# Patient Record
Sex: Female | Born: 1960 | Race: White | Hispanic: No | Smoking: Former smoker
Health system: Southern US, Community
[De-identification: ages and names within clinical notes are randomized; demographics above are authoritative.]

## PROBLEM LIST (undated history)

## (undated) DIAGNOSIS — E785 Hyperlipidemia, unspecified: Secondary | ICD-10-CM

## (undated) DIAGNOSIS — I10 Essential (primary) hypertension: Secondary | ICD-10-CM

## (undated) DIAGNOSIS — F329 Major depressive disorder, single episode, unspecified: Secondary | ICD-10-CM

## (undated) DIAGNOSIS — R112 Nausea with vomiting, unspecified: Secondary | ICD-10-CM

## (undated) DIAGNOSIS — F32A Depression, unspecified: Secondary | ICD-10-CM

## (undated) DIAGNOSIS — M1990 Unspecified osteoarthritis, unspecified site: Secondary | ICD-10-CM

## (undated) DIAGNOSIS — G459 Transient cerebral ischemic attack, unspecified: Secondary | ICD-10-CM

## (undated) DIAGNOSIS — K219 Gastro-esophageal reflux disease without esophagitis: Secondary | ICD-10-CM

## (undated) DIAGNOSIS — I639 Cerebral infarction, unspecified: Secondary | ICD-10-CM

## (undated) DIAGNOSIS — G4730 Sleep apnea, unspecified: Secondary | ICD-10-CM

## (undated) DIAGNOSIS — Z9889 Other specified postprocedural states: Secondary | ICD-10-CM

## (undated) DIAGNOSIS — R011 Cardiac murmur, unspecified: Secondary | ICD-10-CM

## (undated) DIAGNOSIS — J069 Acute upper respiratory infection, unspecified: Secondary | ICD-10-CM

## (undated) DIAGNOSIS — E7800 Pure hypercholesterolemia, unspecified: Secondary | ICD-10-CM

## (undated) DIAGNOSIS — F419 Anxiety disorder, unspecified: Secondary | ICD-10-CM

## (undated) HISTORY — PX: KNEE ARTHROSCOPY: SUR90

## (undated) HISTORY — DX: Sleep apnea, unspecified: G47.30

## (undated) HISTORY — DX: Acute upper respiratory infection, unspecified: J06.9

## (undated) HISTORY — DX: Hyperlipidemia, unspecified: E78.5

## (undated) HISTORY — PX: APPENDECTOMY: SHX54

## (undated) HISTORY — PX: NASAL SINUS SURGERY: SHX719

## (undated) HISTORY — DX: Essential (primary) hypertension: I10

## (undated) HISTORY — PX: JOINT REPLACEMENT: SHX530

## (undated) HISTORY — DX: Pure hypercholesterolemia, unspecified: E78.00

---
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
## 2008-05-05 ENCOUNTER — Emergency Department (HOSPITAL_COMMUNITY): Admission: EM | Admit: 2008-05-05 | Discharge: 2008-05-05 | Payer: Self-pay | Admitting: Emergency Medicine

## 2011-03-19 LAB — D-DIMER, QUANTITATIVE: D-Dimer, Quant: 0.52 — ABNORMAL HIGH

## 2011-03-19 LAB — POCT CARDIAC MARKERS
CKMB, poc: 2.1
Myoglobin, poc: 64.1
Troponin i, poc: <0.05

## 2011-03-19 LAB — POCT I-STAT, CHEM 8
BUN: 9
Calcium, Ion: 1.17
Chloride: 103
Creatinine, Ser: 0.9
Glucose, Bld: 103 — ABNORMAL HIGH
HCT: 41
Hemoglobin: 13.9
Potassium: 3.7
Sodium: 140
TCO2: 26

## 2011-08-12 ENCOUNTER — Emergency Department (HOSPITAL_COMMUNITY): Payer: Medicare Other

## 2011-08-12 ENCOUNTER — Emergency Department (HOSPITAL_COMMUNITY)
Admission: EM | Admit: 2011-08-12 | Discharge: 2011-08-12 | Disposition: A | Discharge status: Home / Self Care | Payer: Medicare Other | Attending: Emergency Medicine | Admitting: Emergency Medicine

## 2011-08-12 ENCOUNTER — Other Ambulatory Visit: Payer: Self-pay

## 2011-08-12 DIAGNOSIS — R079 Chest pain, unspecified: Secondary | ICD-10-CM | POA: Insufficient documentation

## 2011-08-12 DIAGNOSIS — Z79899 Other long term (current) drug therapy: Secondary | ICD-10-CM | POA: Insufficient documentation

## 2011-08-12 DIAGNOSIS — R110 Nausea: Secondary | ICD-10-CM | POA: Insufficient documentation

## 2011-08-12 LAB — POCT I-STAT TROPONIN I
Troponin i, poc: 0.02 ng/mL (ref 0.00–0.08)
Troponin i, poc: 0.01 ng/mL (ref 0.00–0.08)

## 2011-08-12 LAB — COMPREHENSIVE METABOLIC PANEL
AST: 28 U/L (ref 0–37)
Albumin: 3.8 g/dL (ref 3.5–5.2)
Alkaline Phosphatase: 103 U/L (ref 39–117)
BUN: 14 mg/dL (ref 6–23)
CO2: 23 mEq/L (ref 19–32)
Calcium: 10.4 mg/dL (ref 8.4–10.5)
Chloride: 100 mEq/L (ref 96–112)
Glucose, Bld: 246 mg/dL — ABNORMAL HIGH (ref 70–99)
Potassium: 4 mEq/L (ref 3.5–5.1)
Sodium: 137 mEq/L (ref 135–145)
Total Protein: 7.6 g/dL (ref 6.0–8.3)

## 2011-08-12 LAB — DIFFERENTIAL
Basophils Relative: 0 % (ref 0–1)
Eosinophils Absolute: 0 10*3/uL (ref 0.0–0.7)
Eosinophils Relative: 0 % (ref 0–5)
Lymphs Abs: 1.3 10*3/uL (ref 0.7–4.0)
Monocytes Absolute: 0.7 10*3/uL (ref 0.1–1.0)
Monocytes Relative: 5 % (ref 3–12)
Neutro Abs: 12.5 10*3/uL — ABNORMAL HIGH (ref 1.7–7.7)
Neutrophils Relative %: 86 % — ABNORMAL HIGH (ref 43–77)

## 2011-08-12 LAB — CBC
HCT: 43.9 % (ref 36.0–46.0)
Hemoglobin: 15.1 g/dL — ABNORMAL HIGH (ref 12.0–15.0)
MCHC: 34.4 g/dL (ref 30.0–36.0)
RDW: 11.6 % (ref 11.5–15.5)

## 2011-08-12 MED ORDER — MORPHINE SULFATE 2 MG/ML IJ SOLN
2.0000 mg | Freq: Once | INTRAMUSCULAR | Status: AC
  Administered 2011-08-12: 2 mg via INTRAVENOUS

## 2011-08-12 MED ORDER — GI COCKTAIL ~~LOC~~
30.0000 mL | Freq: Once | ORAL | Status: AC
  Administered 2011-08-12: 30 mL via ORAL
  Filled 2011-08-12: qty 30

## 2011-08-12 MED ORDER — MORPHINE SULFATE 4 MG/ML IJ SOLN
4.0000 mg | Freq: Once | INTRAMUSCULAR | Status: AC
  Administered 2011-08-12: 2 mg via INTRAVENOUS
  Filled 2011-08-12: qty 1

## 2011-08-12 MED ORDER — MORPHINE SULFATE 2 MG/ML IJ SOLN
INTRAMUSCULAR | Status: AC
  Filled 2011-08-12: qty 1

## 2011-08-12 MED ORDER — HYDROCODONE-ACETAMINOPHEN 5-325 MG PO TABS: 1.0000 | ORAL_TABLET | Freq: Four times a day (QID) | ORAL | Status: AC | PRN

## 2011-08-12 MED ORDER — ONDANSETRON HCL 4 MG/2ML IJ SOLN
INTRAMUSCULAR | Status: AC
  Administered 2011-08-12: 4 mg
  Filled 2011-08-12: qty 2

## 2011-08-12 NOTE — ED Notes (Signed)
Pt c/o tightness in center of chest onset approx 8 pm last night, states was just sitting watching tv.

## 2011-08-12 NOTE — Discharge Instructions (Signed)
Increase protonix to two times a day and follow up with your md later this week.

## 2011-08-12 NOTE — ED Provider Notes (Signed)
History     CSN: [id number]  Arrival date & time 08/12/11  8295   First MD Initiated Contact with Patient 08/12/11 [phone]      Chief Complaint  Patient presents with  . Chest Pain    (Consider location/radiation/quality/duration/timing/severity/associated sxs/prior treatment) Patient is a 51 y.o. female presenting with chest pain. The history is provided by the patient (patient complains of chest tightness today. She does not have any shortness of breath no sweating the pain is not pleuritic). No language interpreter was used.  Chest Pain The chest pain began less than 1 hour ago. Chest pain occurs constantly. The chest pain is unchanged. Associated with: nothing. At its most intense, the pain is at 6/10. The pain is currently at 4/10. The quality of the pain is described as dull. The pain does not radiate. Exacerbated by: nothing. Pertinent negatives for primary symptoms include no fever, no fatigue, no cough and no abdominal pain.  Pertinent negatives for associated symptoms include no claudication. She tried nothing for the symptoms. Risk factors include no known risk factors.  Pertinent negatives for past medical history include no aneurysm, no PE and no seizures.  Her family medical history is significant for hypertension in family.     No past medical history on file.  No past surgical history on file.  No family history on file.  History  Substance Use Topics  . Smoking status: Not on file  . Smokeless tobacco: Not on file  . Alcohol Use: Not on file    OB History    No data available      Review of Systems  Constitutional: Negative for fever and fatigue.  HENT: Negative for congestion, sinus pressure and ear discharge.   Eyes: Negative for discharge.  Respiratory: Negative for cough.   Cardiovascular: Positive for chest pain. Negative for claudication.  Gastrointestinal: Negative for abdominal pain and diarrhea.  Genitourinary: Negative for frequency and  hematuria.  Musculoskeletal: Negative for back pain.  Skin: Negative for rash.  Neurological: Negative for seizures and headaches.  Hematological: Negative.   Psychiatric/Behavioral: Negative for hallucinations.    Allergies  Sulfa antibiotics and Bactrim  Home Medications   Current Outpatient Rx  Name Route Sig Dispense Refill  . AMOXICILLIN-POT CLAVULANATE 875-125 MG PO TABS Oral Take 1 tablet by mouth 2 (two) times daily.    Marland Kitchen ESCITALOPRAM OXALATE 10 MG PO TABS Oral Take 10 mg by mouth daily.    . OMEGA-3 FATTY ACIDS 1000 MG PO CAPS Oral Take 2 g by mouth daily.    Marland Kitchen FLUTICASONE PROPIONATE 50 MCG/ACT NA SUSP Nasal Place 2 sprays into the nose daily.    Marland Kitchen FOLIC ACID 1 MG PO TABS Oral Take 1 mg by mouth daily.    . FUROSEMIDE 20 MG PO TABS Oral Take 20 mg by mouth 2 (two) times daily.    . IRBESARTAN-HYDROCHLOROTHIAZIDE 300-12.5 MG PO TABS Oral Take 1 tablet by mouth daily.    Marland Kitchen MINOCYCLINE HCL 50 MG PO TABS Oral Take 50 mg by mouth 2 (two) times daily.    . MULTIVITAMINS PO CAPS Oral Take 1 capsule by mouth daily.    Marland Kitchen PANTOPRAZOLE SODIUM 40 MG PO TBEC Oral Take 40 mg by mouth daily.    Marland Kitchen HYDROCODONE-ACETAMINOPHEN 5-325 MG PO TABS Oral Take 1 tablet by mouth every 6 (six) hours as needed for pain. 10 tablet 0    BP 147/66  Temp(Src) 98.3 F (36.8 C) (Oral)  Resp 18  Ht  5\' 4"  (1.626 m)  Wt 223 lb (101.152 kg)  BMI 38.28 kg/m2  SpO2 99%  Physical Exam  Constitutional: She is oriented to person, place, and time. She appears well-developed.  HENT:  Head: Normocephalic and atraumatic.  Eyes: Conjunctivae and EOM are normal. No scleral icterus.  Neck: Neck supple. No thyromegaly present.  Cardiovascular: Normal rate and regular rhythm.  Exam reveals no gallop and no friction rub.   No murmur heard. Pulmonary/Chest: No stridor. She has no wheezes. She has no rales. She exhibits no tenderness.  Abdominal: She exhibits no distension. There is no tenderness. There is no rebound.   Musculoskeletal: Normal range of motion. She exhibits no edema.  Lymphadenopathy:    She has no cervical adenopathy.  Neurological: She is oriented to person, place, and time. Coordination normal.  Skin: No rash noted. No erythema.  Psychiatric: She has a normal mood and affect. Her behavior is normal.    ED Course  Procedures (including critical care time)  Labs Reviewed  CBC - Abnormal; Notable for the following:    WBC 14.5 (*)    Hemoglobin 15.1 (*)    All other components within normal limits  DIFFERENTIAL - Abnormal; Notable for the following:    Neutrophils Relative 86 (*)    Neutro Abs 12.5 (*)    Lymphocytes Relative 9 (*)    All other components within normal limits  COMPREHENSIVE METABOLIC PANEL - Abnormal; Notable for the following:    Glucose, Bld 246 (*)    ALT 46 (*)    Total Bilirubin 0.2 (*)    All other components within normal limits  LIPASE, BLOOD  TROPONIN I  POCT I-STAT TROPONIN I  POCT I-STAT TROPONIN I   Dg Chest Portable 1 View  08/12/2011  *RADIOLOGY REPORT*  Clinical Data: Chest pain and nausea; history of smoking.  PORTABLE CHEST - 1 VIEW  Comparison: Chest radiograph and CTA of the chest performed 05/05/2008  Findings: The lungs are well-aerated.  Minimal left midlung opacity may reflect atelectasis or possibly mild pneumonia.  There is no evidence of pleural effusion or pneumothorax.  The cardiomediastinal silhouette is borderline enlarged.  No acute osseous abnormalities are seen.  IMPRESSION:  1.  Minimal left midlung opacity may reflect atelectasis or possibly mild pneumonia. 2.  Borderline cardiomegaly.  Original Report Authenticated By: Tonia Ghent, M.D.     1. Chest pain     Date: 08/12/2011  Rate:86  Rhythm: normal sinus rhythm  QRS Axis: normal  Intervals: normal  ST/T Wave abnormalities: normal  Conduction Disutrbances:first-degree A-V block   Narrative Interpretation:   Old EKG Reviewed: none available     MDM  Chest  pain,  Non cardiac,          Benny Lennert, MD 08/12/11 [phone]

## 2012-02-16 ENCOUNTER — Emergency Department (HOSPITAL_COMMUNITY)
Admission: EM | Admit: 2012-02-16 | Discharge: 2012-02-16 | Disposition: A | Discharge status: Home / Self Care | Payer: Medicare Other | Attending: Emergency Medicine | Admitting: Emergency Medicine

## 2012-02-16 ENCOUNTER — Encounter (HOSPITAL_COMMUNITY): Payer: Self-pay | Admitting: Emergency Medicine

## 2012-02-16 DIAGNOSIS — S0501XA Injury of conjunctiva and corneal abrasion without foreign body, right eye, initial encounter: Secondary | ICD-10-CM

## 2012-02-16 DIAGNOSIS — S058X9A Other injuries of unspecified eye and orbit, initial encounter: Secondary | ICD-10-CM | POA: Insufficient documentation

## 2012-02-16 DIAGNOSIS — K219 Gastro-esophageal reflux disease without esophagitis: Secondary | ICD-10-CM | POA: Insufficient documentation

## 2012-02-16 DIAGNOSIS — Z882 Allergy status to sulfonamides status: Secondary | ICD-10-CM | POA: Insufficient documentation

## 2012-02-16 DIAGNOSIS — X58XXXA Exposure to other specified factors, initial encounter: Secondary | ICD-10-CM | POA: Insufficient documentation

## 2012-02-16 DIAGNOSIS — Y93H9 Activity, other involving exterior property and land maintenance, building and construction: Secondary | ICD-10-CM | POA: Insufficient documentation

## 2012-02-16 DIAGNOSIS — F411 Generalized anxiety disorder: Secondary | ICD-10-CM | POA: Insufficient documentation

## 2012-02-16 DIAGNOSIS — Z23 Encounter for immunization: Secondary | ICD-10-CM | POA: Insufficient documentation

## 2012-02-16 DIAGNOSIS — I10 Essential (primary) hypertension: Secondary | ICD-10-CM | POA: Insufficient documentation

## 2012-02-16 DIAGNOSIS — Y92009 Unspecified place in unspecified non-institutional (private) residence as the place of occurrence of the external cause: Secondary | ICD-10-CM | POA: Insufficient documentation

## 2012-02-16 DIAGNOSIS — F17200 Nicotine dependence, unspecified, uncomplicated: Secondary | ICD-10-CM | POA: Insufficient documentation

## 2012-02-16 HISTORY — DX: Anxiety disorder, unspecified: F41.9

## 2012-02-16 HISTORY — DX: Essential (primary) hypertension: I10

## 2012-02-16 HISTORY — DX: Gastro-esophageal reflux disease without esophagitis: K21.9

## 2012-02-16 MED ORDER — HYDROCODONE-ACETAMINOPHEN 5-325 MG PO TABS: ORAL_TABLET | ORAL | Status: DC

## 2012-02-16 MED ORDER — TETANUS-DIPHTH-ACELL PERTUSSIS 5-2.5-18.5 LF-MCG/0.5 IM SUSP
0.5000 mL | Freq: Once | INTRAMUSCULAR | Status: AC
  Administered 2012-02-16: 0.5 mL via INTRAMUSCULAR
  Filled 2012-02-16: qty 0.5

## 2012-02-16 MED ORDER — TOBRAMYCIN 0.3 % OP SOLN
2.0000 [drp] | OPHTHALMIC | Status: DC
  Administered 2012-02-16: 2 [drp] via OPHTHALMIC
  Filled 2012-02-16: qty 5

## 2012-02-16 NOTE — ED Provider Notes (Signed)
Medical screening examination/treatment/procedure(s) were performed by non-physician practitioner and as supervising physician I was immediately available for consultation/collaboration.   Glynn Octave, MD 02/16/12 [phone]

## 2012-02-16 NOTE — ED Notes (Signed)
Patient with no complaints at this time. Respirations even and unlabored. Skin warm/dry. Discharge instructions reviewed with patient at this time. Patient given opportunity to voice concerns/ask questions. Patient discharged at this time and left Emergency Department with steady gait.   

## 2012-02-16 NOTE — ED Provider Notes (Signed)
History     CSN: [id number]  Arrival date & time 02/16/12  4098   First MD Initiated Contact with Patient 02/16/12 [phone]      Chief Complaint  Patient presents with  . Eye Pain    (Consider location/radiation/quality/duration/timing/severity/associated sxs/prior treatment) HPI Comments: Patient states she was power washing her home 4 days ago when some of the stucco flew into her eye. She states she was not wearing protective goggles or eyewear. It is of note that she wears contact lens and had contact lens on at the time of the accident. The patient states that she had pain in her eyes at night of the event and the day after. She tried eyewash and eye drops (over-the-counter). She states however these did not help. The pain got progressively worse, and I got more rid and so she came to the emergency department for evaluation of this particular problem.  Patient is a 51 y.o. female presenting with eye pain. The history is provided by the patient.  Eye Pain Pertinent negatives include no abdominal pain, arthralgias, chest pain, coughing or neck pain.    Past Medical History  Diagnosis Date  . Hypertension   . GERD (gastroesophageal reflux disease)   . Anxiety     Past Surgical History  Procedure Date  . Appendectomy   . Joint replacement     Family History  Problem Relation Age of Onset  . Diabetes Father   . Stroke Father     History  Substance Use Topics  . Smoking status: Current Everyday Smoker -- 1.2 packs/day for 30 years    Types: Cigarettes  . Smokeless tobacco: Never Used  . Alcohol Use: Yes     occasionally    OB History    Grav Para Term Preterm Abortions TAB SAB Ect Mult Living            2      Review of Systems  Constitutional: Negative for activity change.       All ROS Neg except as noted in HPI  HENT: Negative for nosebleeds and neck pain.   Eyes: Positive for photophobia, pain and redness. Negative for discharge.  Respiratory: Negative for  cough, shortness of breath and wheezing.   Cardiovascular: Negative for chest pain and palpitations.  Gastrointestinal: Negative for abdominal pain and blood in stool.  Genitourinary: Negative for dysuria, frequency and hematuria.  Musculoskeletal: Negative for back pain and arthralgias.  Skin: Negative.   Neurological: Negative for dizziness, seizures and speech difficulty.  Psychiatric/Behavioral: Negative for hallucinations and confusion. The patient is nervous/anxious.     Allergies  Sulfa antibiotics and Bactrim  Home Medications   Current Outpatient Rx  Name Route Sig Dispense Refill  . AMOXICILLIN-POT CLAVULANATE 875-125 MG PO TABS Oral Take 1 tablet by mouth 2 (two) times daily.    Marland Kitchen ESCITALOPRAM OXALATE 10 MG PO TABS Oral Take 10 mg by mouth daily.    . OMEGA-3 FATTY ACIDS 1000 MG PO CAPS Oral Take 2 g by mouth daily.    Marland Kitchen FLUTICASONE PROPIONATE 50 MCG/ACT NA SUSP Nasal Place 2 sprays into the nose daily.    Marland Kitchen FOLIC ACID 1 MG PO TABS Oral Take 1 mg by mouth daily.    . FUROSEMIDE 20 MG PO TABS Oral Take 20 mg by mouth 2 (two) times daily.    . IRBESARTAN-HYDROCHLOROTHIAZIDE 300-12.5 MG PO TABS Oral Take 1 tablet by mouth daily.    Marland Kitchen MINOCYCLINE HCL 50 MG PO TABS  Oral Take 50 mg by mouth 2 (two) times daily.    . MULTIVITAMINS PO CAPS Oral Take 1 capsule by mouth daily.    Marland Kitchen PANTOPRAZOLE SODIUM 40 MG PO TBEC Oral Take 40 mg by mouth daily.      There were no vitals taken for this visit.  Physical Exam  Nursing note and vitals reviewed. Constitutional: She is oriented to person, place, and time. She appears well-developed and well-nourished.  Non-toxic appearance.  HENT:  Head: Normocephalic.  Right Ear: Tympanic membrane and external ear normal.  Left Ear: Tympanic membrane and external ear normal.  Eyes: EOM and lids are normal. Pupils are equal, round, and reactive to light.       There is mild swelling of the upper lid of the right eye. Lids of the left eye are well  within normal limits. The extraocular movements are intact the anterior chamber is clear. The conjunctiva is injected of the right eye. The lids were everted, and no foreign body was noted under the lids.  Tetracaine was applied to the right eye. Eye was stained with fluorescein. Examination underwent slammed reveals a small corneal abrasion at the 12:00 position. No foreign body is noted.  Neck: Normal range of motion. Neck supple. Carotid bruit is not present.  Cardiovascular: Normal rate, regular rhythm, normal heart sounds, intact distal pulses and normal pulses.   Pulmonary/Chest: Breath sounds normal. No respiratory distress.       Bilateral rhonchi present.  Abdominal: Soft. Bowel sounds are normal. There is no tenderness. There is no guarding.  Musculoskeletal: Normal range of motion.  Lymphadenopathy:       Head (right side): No submandibular adenopathy present.       Head (left side): No submandibular adenopathy present.    She has no cervical adenopathy.  Neurological: She is alert and oriented to person, place, and time. She has normal strength. No cranial nerve deficit or sensory deficit.  Skin: Skin is warm and dry.  Psychiatric: She has a normal mood and affect. Her speech is normal.    ED Course  Procedures (including critical care time)  Labs Reviewed - No data to display No results found.   No diagnosis found.    MDM  I have reviewed nursing notes, vital signs, and all appropriate lab and imaging results for this patient. Patient was pressure washing her house when some stucco flew into the right eye. The patient was not wearing protective eye gear and had on contact lens at the time. Examination at this time reveals a small corneal abrasion at the 12:00 position and the extraocular movements are intact and the anterior chamber is clear. The findings have been discussed with the patient's patient in terms which she understands. The plan at this time is for the patient  to receive tobramycin eyedrops 2 to the right eye every 4 hours for the next 5 days. She is to avoid bright lights. Prescription for Norco one every 4 hours as needed for headache. The patient is to see the ophthalmologist if not improving. Patient is also been advised to not use the contact lens over the next 5 days until this area has healed.       Kathie Dike, [place] 02/16/12 1016

## 2012-02-16 NOTE — ED Notes (Signed)
Patient was pressure washing with water on Thursday and felt like she got something in her right eye. Patient reports eye being slightly irritated. Patient states "I took my contact out last night and that's when it really started to bother me."

## 2014-06-22 ENCOUNTER — Ambulatory Visit (INDEPENDENT_AMBULATORY_CARE_PROVIDER_SITE_OTHER): Payer: Medicare Other | Admitting: Internal Medicine

## 2014-06-22 ENCOUNTER — Encounter (INDEPENDENT_AMBULATORY_CARE_PROVIDER_SITE_OTHER): Payer: Self-pay

## 2014-06-22 ENCOUNTER — Encounter: Payer: Self-pay | Admitting: Internal Medicine

## 2014-06-22 VITALS — BP 132/82 | HR 69 | Ht 64.0 in | Wt 228.8 lb

## 2014-06-22 DIAGNOSIS — G4733 Obstructive sleep apnea (adult) (pediatric): Secondary | ICD-10-CM

## 2014-06-22 NOTE — Progress Notes (Addendum)
06/22/14- 11 yoF smoker Self referral. Owns CPAP- not used x 1 year. Sleep study in [place], [place] (Dr. Felton ). DME [facility]. Epworth Score: 18 NPSG 07/18/09- [place]- AHI 8/ hr She felt better wearing CPAP but quit when she kept taking it off in her sleep. Was using [facility]. Wakes up 2 or 3 times a night and sits, watching TV until she gets sleepy. Daytime sleepiness. Loud snoring. Bedtime between 8 and 9 PM latency 15 minutes, waking 2 or 3 times that up between 6 and 7 AM. Up about 15 pounds. Sinus surgery 2 years ago. 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 One pack per day smoker. Divorced, living with her son. Family history of heart disease and cancer.  Prior to Admission medications   Medication Sig Start Date End Date Taking? Authorizing Provider  Artificial Tear Solution (TEARS AGAIN OP) Apply 1 drop to eye daily as needed. Dry Eyes   Yes Historical Provider, MD  escitalopram (LEXAPRO) 10 MG tablet Take 10 mg by mouth daily.   Yes Historical Provider, MD  estrogens, conjugated, (PREMARIN) 0.625 MG tablet Take 0.625 mg by mouth daily. Take daily for 21 days then do not take for 7 days.   Yes Historical Provider, MD  fish oil-omega-3 fatty acids 1000 MG capsule Take 2 g by mouth daily.   Yes Historical Provider, MD  folic acid (FOLVITE) 1 MG tablet Take 1 mg by mouth daily.   Yes Historical Provider, MD  HYDROcodone-acetaminophen (NORCO) 10-325 MG per tablet Take 1 tablet by mouth daily.   Yes Historical Provider, MD  irbesartan-hydrochlorothiazide (AVALIDE) 300-12.5 MG per tablet Take 1 tablet by mouth daily.   Yes Historical Provider, MD  minocycline (DYNACIN) 50 MG tablet Take 50 mg by mouth daily as needed. Facial Breakouts.   Yes Historical Provider, MD  Multiple Vitamin (MULTIVITAMIN) capsule Take 1 capsule by mouth daily.   Yes Historical Provider, MD  pantoprazole (PROTONIX) 40 MG  tablet Take 40 mg by mouth daily.   Yes Historical Provider, MD  simvastatin (ZOCOR) 20 MG tablet Take 20 mg by mouth daily.   Yes Historical Provider, MD   Past Medical History  Diagnosis Date  . Hypertension   . GERD (gastroesophageal reflux disease)   . Anxiety   . Sleep apnea   . Hyperlipidemia    Past Surgical History  Procedure Laterality Date  . Appendectomy    . Joint replacement    . Nasal sinus surgery     Family History  Problem Relation Age of Onset  . Diabetes Father   . Stroke Father   . Heart disease Father   . Lung cancer Mother    History   Social History  . Marital Status: Divorced    Spouse Name: N/A    Number of Children: N/A  . Years of Education: N/A   Occupational History  . disabled    Social History Main Topics  . Smoking status: Current Every Day Smoker -- 1.00 packs/day for 30 years    Types: Cigarettes  . Smokeless tobacco: Never Used  . Alcohol Use: 0.0 oz/week    0 Not specified per week     Comment: occasionally  . Drug Use: No  . Sexual Activity: Not on file   Other Topics Concern  . Not on file   Social History Narrative   ROS-see HPI Constitutional:   No-   weight loss, night  sweats, fevers, chills, fatigue, lassitude. HEENT:   No-  headaches, difficulty swallowing, tooth/dental problems, sore throat,       No-  sneezing, itching, ear ache, nasal congestion, post nasal drip,  CV:  No-   chest pain, orthopnea, PND, swelling in lower extremities, anasarca,                                  dizziness, palpitations Resp: No-   shortness of breath with exertion or at rest.              No-   productive cough,  No non-productive cough,  No- coughing up of blood.              No-   change in color of mucus.  No- wheezing.   Skin: No-   rash or lesions. GI:  No-   heartburn, indigestion, abdominal pain, nausea, vomiting, diarrhea,                 change in bowel habits, loss of appetite GU: No-   dysuria, change in color of urine,  no urgency or frequency.  No- flank pain. MS:  No-   joint pain or swelling.  No- decreased range of motion.  No- back pain. Neuro-     nothing unusual Psych:  No- change in mood or affect. No depression or anxiety.  No memory loss.  OBJ- Physical Exam   + overweight General- Alert, Oriented, Affect-appropriate, Distress- none acute Skin- rash-none, lesions- none, excoriation- none Lymphadenopathy- none Head- atraumatic            Eyes- Gross vision intact, PERRLA, conjunctivae and secretions clear            Ears- Hearing, canals-normal            Nose- Clear, no-Septal dev, mucus, polyps, erosion, perforation             Throat- Mallampati IV , mucosa clear , drainage- none, tonsils- atrophic, own teeth Neck- flexible , trachea midline, no stridor , thyroid nl, carotid no bruit Chest - symmetrical excursion , unlabored           Heart/CV- RRR , no murmur , no gallop  , no rub, nl s1 s2                           - JVD- none , edema- none, stasis changes- none, varices- none           Lung- clear to P&A, wheeze- none, cough- none , dullness-none, rub- none           Chest wall-  Abd- tender-no, distended-no, bowel sounds-present, HSM- no Br/ Gen/ Rectal- Not done, not indicated Extrem- cyanosis- none, clubbing, none, atrophy- none, strength- nl Neuro- grossly intact to observation

## 2014-06-22 NOTE — Patient Instructions (Signed)
When we get the sleep study report from [facility] we can make recommendations.   For now- Order DME [facility] [place] reset her existing CPAP to autotitration 5-20  X 7 days for pressure recommendation. Mask of choice and supplies, humidifier    Dx OSA  Please call as needed

## 2014-06-23 ENCOUNTER — Telehealth: Payer: Self-pay | Admitting: Internal Medicine

## 2014-06-23 NOTE — Telephone Encounter (Signed)
rec'd records from [facility] Forwarding 13 pages to [facility]

## 2014-06-25 ENCOUNTER — Encounter: Payer: Self-pay | Admitting: Internal Medicine

## 2014-06-25 DIAGNOSIS — G4733 Obstructive sleep apnea (adult) (pediatric): Secondary | ICD-10-CM | POA: Insufficient documentation

## 2014-06-25 NOTE — Assessment & Plan Note (Signed)
Her diagnostic polysomnogram report was received at this office but has gone to this computer scanner, which is behind schedule. We will try to get her reestablished through her original DME company. Hopefully they have a copy of her report. We discussed alternative treatments, sleep hygiene, driving responsibility and weight. Plan-[facility] set CPAP at auto titration 5-20, mask of choice. We can download for pressure recommendation.

## 2014-07-14 ENCOUNTER — Telehealth: Payer: Self-pay | Admitting: Internal Medicine

## 2014-07-14 NOTE — Telephone Encounter (Signed)
Referral Notes     Type Date User   General 06/22/2014 11:25 AM LAW, DAWNE J        Note   Order faxed to [facility]; also faxed copy of pt's new ins card Kara Mead                        Type Date User   Provider Comments 06/22/2014 11:11 AM GREEN, ASHTYN M        Summary   Provider Comments        Note   Pt needs CPAP pressure autotitrated 5-20 x 7 days for pressure recommendation. Mask of choice, supplies and humidifier.  Dx OSA   DME [facility] in [place], [place]        ATC patient and voicemail has not been set up yet;unable to leave message. I therefore called the home number listed for patient and was able to leave a message there for her to contact our office.

## 2014-07-14 NOTE — Telephone Encounter (Signed)
I attempted to call   [facility] Address: [address] [place] [address] [facility]:([phone]  And was unable to reach anyone at this time.

## 2014-07-17 NOTE — Telephone Encounter (Signed)
Called and spoke to pt. Pt stated she has spoken with [facility] and was advised they have not received any info from [name]. Order in epic stated order was faxed to the [facility] in [place]. Called [facility] at [phone] (fax: [phone]) and spoke with Inez Catalina. Inez Catalina stated they never received anything on pt. Inez Catalina stated they will need the order, sleep study, demographics page and insurance card faxed tot he number above and faxed to her attention. Pt's sleep study is not in chart, per phone note from 06/23/2014 sleep study was forwarded to [facility]. Sleep study was not in scan folder or up front to be scanned. ATC Dr. Algernon Huxley office (x 3) with [facility] pulmonology at [phone] and the line was busy.   Will forward to Katie to follow and attempt to call Dr. Algernon Huxley office again to have sleep study faxed back over.

## 2014-07-17 NOTE — Telephone Encounter (Signed)
[phone] calling back

## 2014-07-18 NOTE — Telephone Encounter (Signed)
I have located the sleep study int he to be scanned file at front desk. I have made a copy of it and given to [facility] to have her re-fax the information with order to [facility] ,[place] location as well as call them to confirm they received the information and work on it STAT for patient.    LMTCB for patient-will ask for [facility]

## 2014-07-18 NOTE — Telephone Encounter (Signed)
Spoke with pt. Informed pt of the status and how we are waiting on the sleep study from Dr. Algernon Huxley office to fax to [facility]. Pt verbalized understanding and denied any further questions or concerns at this time. Pt aware we will keep her updated.   Called Dr. Algernon Huxley office and was placed on a 10 min hold, was then transferred to a voicemail that could not accept voicemail messages. WCB.

## 2014-07-18 NOTE — Telephone Encounter (Signed)
Pt is in lobby and wants to talk to nurse about this.

## 2014-07-18 NOTE — Telephone Encounter (Signed)
Pt returned my call-she is aware that Ivor Reining has re-faxed the information to [facility] in [place] and to let me know if she has not heard from them by Thursday. Nothing more needed at this time.

## 2014-08-22 ENCOUNTER — Ambulatory Visit: Payer: Medicare Other | Admitting: Internal Medicine

## 2014-10-25 ENCOUNTER — Ambulatory Visit: Payer: Medicare Other | Admitting: Internal Medicine

## 2015-06-17 DIAGNOSIS — G459 Transient cerebral ischemic attack, unspecified: Secondary | ICD-10-CM

## 2015-06-17 HISTORY — DX: Transient cerebral ischemic attack, unspecified: G45.9

## 2016-03-31 ENCOUNTER — Encounter: Payer: Self-pay | Admitting: Neurology

## 2016-03-31 ENCOUNTER — Ambulatory Visit (INDEPENDENT_AMBULATORY_CARE_PROVIDER_SITE_OTHER): Payer: Medicare HMO | Admitting: Neurology

## 2016-03-31 VITALS — BP 134/76 | HR 74 | Ht 64.0 in | Wt 207.0 lb

## 2016-03-31 DIAGNOSIS — F17200 Nicotine dependence, unspecified, uncomplicated: Secondary | ICD-10-CM

## 2016-03-31 DIAGNOSIS — I10 Essential (primary) hypertension: Secondary | ICD-10-CM

## 2016-03-31 DIAGNOSIS — I63212 Cerebral infarction due to unspecified occlusion or stenosis of left vertebral arteries: Secondary | ICD-10-CM

## 2016-03-31 DIAGNOSIS — I639 Cerebral infarction, unspecified: Secondary | ICD-10-CM | POA: Diagnosis not present

## 2016-03-31 DIAGNOSIS — R42 Dizziness and giddiness: Secondary | ICD-10-CM | POA: Diagnosis not present

## 2016-03-31 DIAGNOSIS — E785 Hyperlipidemia, unspecified: Secondary | ICD-10-CM | POA: Diagnosis not present

## 2016-03-31 NOTE — Progress Notes (Signed)
Chart forwarded.  

## 2016-03-31 NOTE — Patient Instructions (Addendum)
1.  Agree with aspirin daily 2.  Continue cholesterol medication 3.  Advise to quit smoking 4.  Will check CTA of head and neck [facility] ([address] [place], [place] [address] ph: [phone] fax: [phone]) 5.  Will check 2d echocardiogram - [place] at [facility], [place] [address], [address], ph: [phone]. Your appointment is scheduled for 04/14/16 @2  p.m., please arrive at 1:45 6.  Follow up in 4 months

## 2016-03-31 NOTE — Progress Notes (Signed)
NEUROLOGY CONSULTATION NOTE  Jenna Hancock MRN: [id number] DOB: 03/03/1961  Referring provider: Dannial Monarch, NP Primary care provider: Margaretha Sheffield, MD  Reason for consult:  Amaurosis fugax, stroke  HISTORY OF PRESENT ILLNESS: Jenna Hancock is a 55 year old right-handed woman with hyperlipidemia, HTN, depression, smoker and anxiety who presents for amaurosis fugax.  History obtained by patient and her husband, who accompanies her.  Imaging of brain MRI and carotid doppler personally reviewed.  She has had recurrent transient spells since last year.  She had two spells in the summer of 2016 and 4 spells this past summer, in July.  She describes them as feeling lightheaded, like she is going to pass out.  She has never actually passed out.  It is preceded by seeing white spots in vision of both eyes.  It is accompanied by shortness of breath and diaphoresis.  She denies headache, spinning sensation, facial droop, unilateral weakness or numbness, ataxia, slurred speech, chest pain or palpitations.  She reportedly looks pale when it occurs.  It lasts several minutes, but she had one episode lasting 30 minutes and another episode longer than that.  On one occasion, she felt fatigued for the rest of the day.    She had an MRI of the brain with and without contrast performed on 02/04/16, which revealed chronic infarct in the left cerebellar hemisphere with other small vessel ischemic changes in the cerebral white matter.  Carotid doppler showed no hemodynamically significant stenosis in the bilateral internal carotid arteries, but it also demonstrated no flow in the left vertebral artery.  She was started on ASA 162mg  daily.  PAST MEDICAL HISTORY: Past Medical History:  Diagnosis Date  . Anxiety   . GERD (gastroesophageal reflux disease)   . Hyperlipidemia   . Hypertension   . Sleep apnea     PAST SURGICAL HISTORY: Past Surgical History:  Procedure Laterality Date  . APPENDECTOMY     . JOINT REPLACEMENT    . NASAL SINUS SURGERY      MEDICATIONS: Current Outpatient Prescriptions on File Prior to Visit  Medication Sig Dispense Refill  . escitalopram (LEXAPRO) 10 MG tablet Take 10 mg by mouth daily.    . fish oil-omega-3 fatty acids 1000 MG capsule Take 2 g by mouth daily.    . folic acid (FOLVITE) 1 MG tablet Take 1 mg by mouth daily.    Marland Kitchen HYDROcodone-acetaminophen (NORCO) 10-325 MG per tablet Take 1 tablet by mouth daily.    . irbesartan-hydrochlorothiazide (AVALIDE) 300-12.5 MG per tablet Take 1 tablet by mouth daily.    . Multiple Vitamin (MULTIVITAMIN) capsule Take 1 capsule by mouth daily.    . pantoprazole (PROTONIX) 40 MG tablet Take 40 mg by mouth daily.    . simvastatin (ZOCOR) 20 MG tablet Take 20 mg by mouth daily.     No current facility-administered medications on file prior to visit.     ALLERGIES: Allergies  Allergen Reactions  . Bactrim Rash  . Sulfa Antibiotics Rash    FAMILY HISTORY: Family History  Problem Relation Age of Onset  . Diabetes Father   . Stroke Father   . Heart disease Father   . Lung cancer Mother     SOCIAL HISTORY: Social History   Social History  . Marital status: Divorced    Spouse name: N/A  . Number of children: N/A  . Years of education: N/A   Occupational History  . disabled    Social History Main Topics  .  Smoking status: Current Every Day Smoker    Packs/day: 1.00    Years: 30.00    Types: Cigarettes  . Smokeless tobacco: Never Used  . Alcohol use 0.0 oz/week     Comment: occasionally  . Drug use: No  . Sexual activity: Not on file   Other Topics Concern  . Not on file   Social History Narrative  . No narrative on file    REVIEW OF SYSTEMS: Constitutional: No fevers, chills, or sweats, no generalized fatigue, change in appetite Eyes: No visual changes, double vision, eye pain Ear, nose and throat: No hearing loss, ear pain, nasal congestion, sore throat Cardiovascular: No chest pain,  palpitations Respiratory:  No shortness of breath at rest or with exertion, wheezes GastrointestinaI: No nausea, vomiting, diarrhea, abdominal pain, fecal incontinence Genitourinary:  No dysuria, urinary retention or frequency Musculoskeletal:  No neck pain, back pain Integumentary: No rash, pruritus, skin lesions Neurological: as above Psychiatric: No depression, insomnia, anxiety Endocrine: No palpitations, fatigue, diaphoresis, mood swings, change in appetite, change in weight, increased thirst Hematologic/Lymphatic:  No purpura, petechiae. Allergic/Immunologic: no itchy/runny eyes, nasal congestion, recent allergic reactions, rashes  PHYSICAL EXAM: Vitals:   03/31/16 0759  BP: 134/76  Pulse: 74   General: No acute distress.  Patient appears well-groomed.  Head:  Normocephalic/atraumatic Eyes:  fundi examined but not visualized Neck: supple, no paraspinal tenderness, full range of motion Back: No paraspinal tenderness Heart: regular rate and rhythm Lungs: Clear to auscultation bilaterally. Vascular: No carotid bruits. Neurological Exam: Mental status: alert and oriented to person, place, and time, recent and remote memory intact, fund of knowledge intact, attention and concentration intact, speech fluent and not dysarthric, language intact. Cranial nerves: CN I: not tested CN II: pupils equal, round and reactive to light, visual fields intact CN III, IV, VI:  full range of motion, no nystagmus, no ptosis CN V: facial sensation intact CN VII: upper and lower face symmetric CN VIII: hearing intact CN IX, X: gag intact, uvula midline CN XI: sternocleidomastoid and trapezius muscles intact CN XII: tongue midline Bulk & Tone: normal, no fasciculations. Motor:  5/5 throughout  Sensation: temperature and vibration sensation intact. Deep Tendon Reflexes:  2+ throughout, toes downgoing.  Finger to nose testing:  Without dysmetria.  Heel to shin:  Without dysmetria.  Gait:   Normal station and stride.  Able to turn and tandem walk. Romberg negative.  IMPRESSION: 1.  Recurrent dizziness, near-syncope.  Symptoms not consistent with amaurosis fugax (it is bilateral).  Non-focal or lateralizing symptoms and I cannot say is related to cerebrovascular event.  Based on symptoms, events conceivably may be due to basilar artery stenosis.  Consider cardiac etiology.   2.  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  HTN 4.  Hyperlipidemia 5.  Cigarette smoker  PLAN: 1.  Will check CTA of head and neck to evaluate extent of vertebral artery stenosis/occlusion, as well as evidence of basilar artery stenosis. 2.  Check 2D echo 3.  Continue antiplatelet therapy daily for secondary stroke prevention 4.  Continue statin therapy as managed by PCP (LDL goal should be less than 100) 5.  Continue blood pressure control as managed by PCP 6.  Discussed smoking cessation. 7.  Follow up in 4 months.  Thank you for allowing me to take part in the care of this patient.  Metta Clines, DO  CC:  Dannial Monarch, NP  Margaretha Sheffield, MD

## 2016-04-14 ENCOUNTER — Other Ambulatory Visit: Payer: Self-pay

## 2016-04-14 ENCOUNTER — Ambulatory Visit (HOSPITAL_COMMUNITY): Payer: Medicare HMO | Attending: Cardiology

## 2016-04-14 ENCOUNTER — Ambulatory Visit
Admission: RE | Admit: 2016-04-14 | Discharge: 2016-04-14 | Disposition: A | Discharge status: Home / Self Care | Payer: Medicare HMO | Source: Ambulatory Visit | Attending: Neurology | Admitting: Neurology

## 2016-04-14 DIAGNOSIS — I639 Cerebral infarction, unspecified: Secondary | ICD-10-CM | POA: Insufficient documentation

## 2016-04-14 MED ORDER — IOPAMIDOL (ISOVUE-370) INJECTION 76%
100.0000 mL | Freq: Once | INTRAVENOUS | Status: AC | PRN
  Administered 2016-04-14: 100 mL via INTRAVENOUS

## 2016-04-15 ENCOUNTER — Telehealth: Payer: Self-pay

## 2016-04-15 NOTE — Telephone Encounter (Signed)
-----   Message from Pieter Partridge, DO sent at 04/15/2016 12:44 PM EDT ----- CTA and echo do not show any cause for dizziness.  No further recommendations at this time.

## 2016-04-15 NOTE — Telephone Encounter (Signed)
Left message on machine for pt to return call to the office.  

## 2016-04-15 NOTE — Telephone Encounter (Signed)
Message relayed to patient. Verbalized understanding and denied questions.   

## 2016-07-22 ENCOUNTER — Telehealth: Payer: Self-pay | Admitting: Neurology

## 2016-07-22 NOTE — Telephone Encounter (Signed)
It didn't reveal anything that would cause dizziness

## 2016-07-22 NOTE — Telephone Encounter (Signed)
Jenna Hancock 07/14/1960. She has had 2 test done and she would like to get her results from them. She also said she  would like her records of her visits with Dr. Tomi Likens. She will be coming in on 08/05/16 for a follow up. She said she can pick them up at that time.  Her # [id number]. Thank you

## 2016-07-22 NOTE — Telephone Encounter (Addendum)
Spoke to patient. She is requesting CT results from October. Advised will fwd request to Dr. Tomi Likens. Advised will have OV notes available when she comes in for her visit on 02/20.

## 2016-07-23 NOTE — Telephone Encounter (Signed)
Called patient to give CT results. No answer. Left vmail.

## 2016-08-05 ENCOUNTER — Ambulatory Visit (INDEPENDENT_AMBULATORY_CARE_PROVIDER_SITE_OTHER): Payer: Medicare PPO | Admitting: Neurology

## 2016-08-05 ENCOUNTER — Encounter: Payer: Self-pay | Admitting: Neurology

## 2016-08-05 ENCOUNTER — Other Ambulatory Visit (INDEPENDENT_AMBULATORY_CARE_PROVIDER_SITE_OTHER): Payer: Medicare PPO

## 2016-08-05 VITALS — BP 132/68 | HR 62 | Ht 64.0 in | Wt 213.9 lb

## 2016-08-05 DIAGNOSIS — I63212 Cerebral infarction due to unspecified occlusion or stenosis of left vertebral arteries: Secondary | ICD-10-CM | POA: Diagnosis not present

## 2016-08-05 DIAGNOSIS — E785 Hyperlipidemia, unspecified: Secondary | ICD-10-CM

## 2016-08-05 DIAGNOSIS — R42 Dizziness and giddiness: Secondary | ICD-10-CM | POA: Diagnosis not present

## 2016-08-05 DIAGNOSIS — R413 Other amnesia: Secondary | ICD-10-CM

## 2016-08-05 DIAGNOSIS — I10 Essential (primary) hypertension: Secondary | ICD-10-CM

## 2016-08-05 DIAGNOSIS — F17200 Nicotine dependence, unspecified, uncomplicated: Secondary | ICD-10-CM

## 2016-08-05 LAB — VITAMIN B12: VITAMIN B 12: 273 pg/mL (ref 211–911)

## 2016-08-05 LAB — TSH: TSH: 1.29 u[IU]/mL (ref 0.35–4.50)

## 2016-08-05 NOTE — Patient Instructions (Signed)
1.  Change aspirin to one 325mg  pill daily 2.  Continue cholesterol medication and blood pressure medication 3.  Quit smoking 4.  I would ask your PCP to look into other causes of lightheadedness. 5.  To assess memory, we will check B12, TSH and order neurocognitive testing 6.  Follow up after testing.

## 2016-08-05 NOTE — Progress Notes (Signed)
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  She is accompanied by her husband who supplements history.  UPDATE: CTA of head and neck from 04/14/16 were personally reviewed and demonstrated occluded left vertebral artery from its origin to the distal V2 segment with very thin reconstitution from the distal V2 segment to the left PICA origin.  Dominant right vertebral artery, left PICA and basilar arteries are patent.   2D echo from 04/14/16 showed EF 65-70% with no cardiac source of embolus.  Since last visit, she has had 3 other episodes of dizziness (lightheadedness/near-syncope, not vertigo), but not as severe.  She is quite anxious.  She and her husband reports that her memory is worse and has been a problem for the past year.  She has been forgetting to pay bills.  She does not get disoriented driving on familiar routes.     HISTORY: She has had recurrent transient spells since last year.  She had two spells in the summer of 2016 and 4 spells this past summer, in July.  She describes them as feeling lightheaded, like she is going to pass out.  She has never actually passed out.  It is preceded by seeing white spots in vision of both eyes.  It is accompanied by shortness of breath and diaphoresis.  She denies headache, spinning sensation, facial droop, unilateral weakness or numbness, ataxia, slurred speech, chest pain or palpitations.  She reportedly looks pale when it occurs.  It lasts several minutes, but she had one episode lasting 30 minutes and another episode longer than that.  On one occasion, she felt fatigued for the rest of the day.     She had an MRI of the brain with and without contrast performed on 02/04/16, which  revealed chronic infarct in the left cerebellar hemisphere with other small vessel ischemic changes in the cerebral white matter.   Carotid doppler showed no hemodynamically significant stenosis in the bilateral internal carotid arteries, but it also demonstrated no flow in the left vertebral artery.  PAST MEDICAL HISTORY: Past Medical History:  Diagnosis Date  . Anxiety   . GERD (gastroesophageal reflux disease)   . Hyperlipidemia   . Hypertension   . Sleep apnea     MEDICATIONS: Current Outpatient Prescriptions on File Prior to Visit  Medication Sig Dispense Refill  . escitalopram (LEXAPRO) 10 MG tablet Take 10 mg by mouth daily.    . fish oil-omega-3 fatty acids 1000 MG capsule Take 2 g by mouth daily.    . folic acid (FOLVITE) 1 MG tablet Take 1 mg by mouth daily.    . furosemide (LASIX) 20 MG tablet     . HYDROcodone-acetaminophen (NORCO) 10-325 MG per tablet Take 1 tablet by mouth daily.    . irbesartan-hydrochlorothiazide (AVALIDE) 300-12.5 MG per tablet Take 1 tablet by mouth daily.    . Multiple Vitamin (MULTIVITAMIN) capsule Take 1 capsule by mouth daily.    . pantoprazole (PROTONIX) 40 MG tablet Take 40 mg by mouth daily.    . simvastatin (ZOCOR) 20 MG tablet Take 20 mg by mouth daily.     No current facility-administered medications on file prior to visit.     ALLERGIES: Allergies  Allergen Reactions  . Bactrim Rash  . Sulfa Antibiotics Rash  FAMILY HISTORY: Family History  Problem Relation Age of Onset  . Diabetes Father   . Stroke Father   . Heart disease Father   . Lung cancer Mother     SOCIAL HISTORY: Social History   Social History  . Marital status: Divorced    Spouse name: N/A  . Number of children: N/A  . Years of education: N/A   Occupational History  . disabled    Social History Main Topics  . Smoking status: Current Every Day Smoker    Packs/day: 1.00    Years: 30.00    Types: Cigarettes  . Smokeless tobacco: Never Used  .  Alcohol use 0.0 oz/week     Comment: occasionally  . Drug use: No  . Sexual activity: Not on file   Other Topics Concern  . Not on file   Social History Narrative  . No narrative on file    REVIEW OF SYSTEMS: Constitutional: No fevers, chills, or sweats, no generalized fatigue, change in appetite Eyes: No visual changes, double vision, eye pain Ear, nose and throat: No hearing loss, ear pain, nasal congestion, sore throat Cardiovascular: No chest pain, palpitations Respiratory:  No shortness of breath at rest or with exertion, wheezes GastrointestinaI: No nausea, vomiting, diarrhea, abdominal pain, fecal incontinence Genitourinary:  No dysuria, urinary retention or frequency Musculoskeletal:  No neck pain, back pain Integumentary: No rash, pruritus, skin lesions Neurological: as above Psychiatric: No depression, insomnia, anxiety Endocrine: No palpitations, fatigue, diaphoresis, mood swings, change in appetite, change in weight, increased thirst Hematologic/Lymphatic:  No purpura, petechiae. Allergic/Immunologic: no itchy/runny eyes, nasal congestion, recent allergic reactions, rashes  PHYSICAL EXAM: Vitals:   08/05/16 1102  BP: 132/68  Pulse: 62   General: No acute distress.  Patient appears well-groomed.   Head:  Normocephalic/atraumatic Eyes:  Fundi examined but not visualized Neck: supple, no paraspinal tenderness, full range of motion Heart:  Regular rate and rhythm Lungs:  Clear to auscultation bilaterally Back: No paraspinal tenderness Neurological Exam: alert and oriented to person, place, and time. Attention span and concentration intact, recent and remote memory intact, fund of knowledge intact.  Speech fluent and not dysarthric, language intact.   MMSE - Mini Mental State Exam 08/05/2016  Orientation to time 4  Orientation to Place 5  Registration 3  Attention/ Calculation 5  Recall 3  Language- name 2 objects 2  Language- repeat 1  Language- follow 3 step  command 3  Language- read & follow direction 1  Write a sentence 1  Copy design 1  Total score 29   CN II-XII intact. Bulk and tone normal, muscle strength 5/5 throughout.  Sensation to light touch  intact.  Deep tendon reflexes 2+ throughout.  Finger to nose testing intact.  Gait normal  IMPRESSION: 1.  Recurrent dizziness, near-syncope.  She does have evidence of an old left cerebellar infarct with left vertebral artery occlusion.  However, her symptoms are near-syncope and not related to spinning sensation, ataxia, unilateral dysmetria or weakness. Her right vertebral artery and basilar artery are patent, so this should not be the cause of the near-syncope.   2.  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  Memory defictis 4.  Hyperlipidemia 5.  Cigarette smoker 6.  HTN  PLAN: 1.  She should change ASA to 325mg  daily 2.  Continue statin therapy.  She is on simvastatin and may benefit from atorvastatin.  We will check fasting lipid panel and make necessary  adjustments. 3.  Continue blood pressure control 4.  Discussed smoking cessation and risk of stroke. 5.  Regarding memory, will check B12, TSH and order neuropsychological testing. 6.  Follow up after testing.  Metta Clines, DO  CC: Margaretha Sheffield, MD

## 2016-08-07 ENCOUNTER — Telehealth: Payer: Self-pay

## 2016-08-07 NOTE — Telephone Encounter (Signed)
Called patient. Gave lab results and recommendations. Patient verbalized understanding.

## 2016-08-07 NOTE — Telephone Encounter (Signed)
-----   Message from Pieter Partridge, DO sent at 08/05/2016  3:45 PM EST ----- B12 level is low normal range.  Recommend starting over the counter B12 1000 mcg daily.  It may help with memory.

## 2016-08-20 ENCOUNTER — Institutional Professional Consult (permissible substitution): Payer: Medicare HMO | Admitting: Internal Medicine

## 2016-09-29 ENCOUNTER — Ambulatory Visit (INDEPENDENT_AMBULATORY_CARE_PROVIDER_SITE_OTHER): Payer: Medicare PPO | Admitting: Psychology

## 2016-09-29 ENCOUNTER — Encounter: Payer: Self-pay | Admitting: Psychology

## 2016-09-29 DIAGNOSIS — R413 Other amnesia: Secondary | ICD-10-CM | POA: Diagnosis not present

## 2016-09-29 DIAGNOSIS — I63212 Cerebral infarction due to unspecified occlusion or stenosis of left vertebral arteries: Secondary | ICD-10-CM

## 2016-09-29 DIAGNOSIS — F419 Anxiety disorder, unspecified: Secondary | ICD-10-CM

## 2016-09-29 NOTE — Progress Notes (Signed)
NEUROPSYCHOLOGICAL INTERVIEW (CPT: [id number])  Name: Jenna Hancock Date of Birth: February 25, 1961 Date of Interview: 09/29/2016  Reason for Referral:  Jenna Hancock is a 56 y.o. right-handed female who is referred for neuropsychological evaluation by Dr. Metta Clines of [facility] due to concerns about memory loss. This patient is unaccompanied in the office for today's visit.  History of Presenting Problem:  Jenna Hancock was seen for initial neurologic evaluation by Dr. Tomi Likens on 03/31/2016 for episodic dizziness. CTA of the head on 04/14/2016 revealed chronic left cerebellar infarct, patchy white matter changes most suggestive of chronic small vessel disease, and occluded left vertebral artery. She last saw Dr. Tomi Likens on 08/05/2016 and reported memory concerns. She scored 29/30 on the MMSE.   At today's visit, the patient reported gradual onset of memory difficulty about 8 months ago. She reported worsening over time. She reported significant short term memory loss and attention lapses. For example, she reported that she has forgotten to turn the water off to her pool and left it running for over an hour on one occasion. She reported she has more difficulty recalling recent events such as what she did yesterday. Her children tell her she repeats questions/statements. She is misplacing items more frequently. She is having difficulty concentrating. She is starting but not finishing tasks, due to distractibility. She is having more trouble processing information when she is reading. She endorses word finding difficulty when speaking. She feels her spelling has declined. She also reports that she has gotten disoriented when driving and has occasionally had to pull over and think about how to get where she is going. She has made more wrong turns when driving.   She endorses significant anxiety and stress surrounding her daughter's impending divorce. She feels tense and has some difficulty sleeping. She  worries about the wellbeing of her grandson. She has to communicate with her daughter's in-laws which makes her feel very stressed. She is also concerned that, with the memory difficulties she is having, she may be developing dementia. Her father had dementia in his 61s. He had a stroke and was on dialysis for four years before he passed away at 57yo.   The patient denies any recent episodes of dizziness. She reported the dizzy spells started before all the stress with her daughter's marriage began. She reported she has not had any dizziness since last year.  She denied history of head injury.    Current Functioning: Jenna Hancock lives with her boyfriend and 54yo son. Her son has lived with her his whole life but he is getting ready to move out on his own for a new job about 40 minutes away. She is happy for him but very sad he will be leaving.  Jenna Hancock is unemployed, on disability since she was 56yo and had to undergo a knee replacement.  She continues to manage all instrumental ADLs including driving, medications (reports good compliance with good routine), finances (reports she is generally good at paying bills on time), appointments (writes reminders to herself), and cooking (enjoys cooking and denies any difficulties with this).   Physically, she reports she has been feeling pretty well lately although she does have chronic back and hip pain. She enjoys working outside [employer] my therapy"). She has not had any difficulty with balance or walking. She has not had any falls. She has sleep apnea and has tried CPAP treatment in the past but was unable to tolerate the device (took it off in her sleep  without realizing it). She reports feeling well rested upon awakening, however. She does start to have increased pain and reduced energy by late afternoon.   She reports her mood is stressed and anxious but not depressed. She denied suicidal ideation or intention. She denied hallucinations or indications  of psychosis. Her appetite is increased.    Psychiatric History: The patient reported a history of depression while going through a very bad divorce many years ago. She experienced suicidal ideation at that time. She was put on an antidepressant at that time, and continues to take the medication. She and her children had family counseling after the divorce which was helpful.  She denied any other psychiatric history. She denied history of substance abuse or dependence.   Social History: Born/Raised: [place] Education: High school graduate Occupational history: Worked at [employer] before she became disabled at age 55 due to knee replacement Marital history: Divorced (x2). Two children. Two grandchildren. Reports being very close with her family. Alcohol/Tobacco/Substances: Very minimal alcohol use (one drink approx 3x a year). Daily cigarette smoker since 56yo (currently 1/2 ppd). Has tried to quit and wants to quit but difficult with increased stress level. No illicit substance use.   Medical History: Past Medical History:  Diagnosis Date  . Anxiety   . GERD (gastroesophageal reflux disease)   . Hyperlipidemia   . Hypertension   . Sleep apnea     Current Medications:  Outpatient Encounter Prescriptions as of 09/29/2016  Medication Sig  . escitalopram (LEXAPRO) 10 MG tablet Take 10 mg by mouth daily.  . fish oil-omega-3 fatty acids 1000 MG capsule Take 2 g by mouth daily.  Marland Kitchen FLUoxetine (PROZAC) 20 MG capsule Take 1 capsule by mouth daily.  . folic acid (FOLVITE) 1 MG tablet Take 1 mg by mouth daily.  . furosemide (LASIX) 20 MG tablet   . HYDROcodone-acetaminophen (NORCO) 10-325 MG per tablet Take 1 tablet by mouth daily.  . irbesartan-hydrochlorothiazide (AVALIDE) 300-12.5 MG per tablet Take 1 tablet by mouth daily.  . Multiple Vitamin (MULTIVITAMIN) capsule Take 1 capsule by mouth daily.  . pantoprazole (PROTONIX) 40 MG tablet Take 40 mg by mouth daily.  . simvastatin (ZOCOR) 20 MG  tablet Take 20 mg by mouth daily.   No facility-administered encounter medications on file as of 09/29/2016.     Behavioral Observations:   Appearance: Casually dressed and appropriately groomed Gait: Ambulated independently, no abnormalities observed Speech: Fluent; normal rate, rhythm and volume Thought process: Linear, goal directed Affect: Full, anxious, tearful Interpersonal: Pleasant, appropriate   TESTING: There is medical necessity to proceed with neuropsychological assessment as the results will be used to aid in differential diagnosis and clinical decision-making and to inform specific treatment recommendations. Per the patient and medical records reviewed, there has been a change in cognitive functioning and a reasonable suspicion of vascular cognitive impairment.   PLAN: The patient will return on 10/01/2016 for a full battery of neuropsychological testing with a psychometrician under my supervision. Education regarding testing procedures was provided. Subsequently, the patient will see this provider for a follow-up session at which time her test performances and my impressions and treatment recommendations will be reviewed in detail.   Full neuropsychological evaluation report to follow.

## 2016-10-01 ENCOUNTER — Ambulatory Visit (INDEPENDENT_AMBULATORY_CARE_PROVIDER_SITE_OTHER): Payer: Medicare PPO | Admitting: Psychology

## 2016-10-01 DIAGNOSIS — I63212 Cerebral infarction due to unspecified occlusion or stenosis of left vertebral arteries: Secondary | ICD-10-CM

## 2016-10-01 DIAGNOSIS — R413 Other amnesia: Secondary | ICD-10-CM

## 2016-10-01 NOTE — Progress Notes (Signed)
   Neuropsychology Note  Jenna Hancock returned today for 3 hours of neuropsychological testing with technician, Milana Kidney, BS, under the supervision of Dr. Macarthur Critchley. The patient appeared anxious and on one occasion tearful by the testing session, per behavioral observation or via self-report to the technician. Rest breaks were offered. Jenna Hancock will return within 2 weeks for a feedback session with Dr. Si Raider at which time her test performances, clinical impressions and treatment recommendations will be reviewed in detail. The patient understands she can contact our office should she require our assistance before this time.  Full report to follow.

## 2016-10-08 NOTE — Progress Notes (Signed)
NEUROPSYCHOLOGICAL EVALUATION   Name:    Jenna Hancock  Date of Birth:   1961/06/06 Date of Interview:  09/29/2016 Date of Testing:  10/01/2016   Date of Feedback:  10/09/2016       Background Information:  Reason for Referral:  Jenna Hancock is a 56 y.o. right handed female referred by Dr. Metta Clines to assess her current level of cognitive functioning and assist in differential diagnosis. The current evaluation consisted of a review of available medical records, an interview with the patient, and the completion of a neuropsychological testing battery. Informed consent was obtained.  History of Presenting Problem:  Jenna Hancock was seen for initial neurologic evaluation by Dr. Tomi Likens on 03/31/2016 for episodic dizziness. CTA of the head on 04/14/2016 revealed chronic left cerebellar infarct, patchy white matter changes most suggestive of chronic small vessel disease, and occluded left vertebral artery. She last saw Dr. Tomi Likens on 08/05/2016 and reported memory concerns. She scored 29/30 on the MMSE.   At today's visit, the patient reported gradual onset of memory difficulty about 8 months ago. She reported worsening over time. She reported significant short term memory loss and attention lapses. For example, she reported that she has forgotten to turn the water off to her pool and left it running for over an hour on one occasion. She reported she has more difficulty recalling recent events such as what she did yesterday. Her children tell her she repeats questions/statements. She is misplacing items more frequently. She is having difficulty concentrating. She is starting but not finishing tasks, due to distractibility. She is having more trouble processing information when she is reading. She endorses word finding difficulty when speaking. She feels her spelling has declined. She also reports that she has gotten disoriented when driving and has occasionally had to pull over and think about how to  get where she is going. She has made more wrong turns when driving.   She endorses significant anxiety and stress surrounding her daughter's impending divorce. She feels tense and has some difficulty sleeping. She worries about the wellbeing of her grandson. She has to communicate with her daughter's in-laws which makes her feel very stressed. She is also concerned that, with the memory difficulties she is having, she may be developing dementia. Her father had dementia in his 36s. He had a stroke and was on dialysis for four years before he passed away at 57yo.   The patient denies any recent episodes of dizziness. She reported the dizzy spells started before all the stress with her daughter's marriage began. She reported she has not had any dizziness since last year.  She denied history of head injury.    Current Functioning: Jenna Hancock lives with her boyfriend and 56yo son. Her son has lived with her his whole life but he is getting ready to move out on his own for a new job about 40 minutes away. She is happy for him but very sad he will be leaving.   Jenna Hancock is unemployed, on disability since she was 56yo and had to undergo a knee replacement.   She continues to manage all instrumental ADLs including driving, medications (reports good compliance with good routine), finances (reports she is generally good at paying bills on time), appointments (writes reminders to herself), and cooking (enjoys cooking and denies any difficulties with this).   Physically, she reports she has been feeling pretty well lately although she does have chronic back and hip pain. She  enjoys working outside [employer] my therapy"). She has not had any difficulty with balance or walking. She has not had any falls. She has sleep apnea and has tried CPAP treatment in the past but was unable to tolerate the device (took it off in her sleep without realizing it). She reports feeling well rested upon awakening, however.  She does start to have increased pain and reduced energy by late afternoon.   She reports her mood is stressed and anxious but not depressed. She denied suicidal ideation or intention. She denied hallucinations or indications of psychosis. Her appetite is increased.    Psychiatric History: The patient reported a history of depression while going through a very bad divorce many years ago. She experienced suicidal ideation at that time. She was put on an antidepressant at that time, and continues to take the medication. She and her children had family counseling after the divorce which was helpful.  She denied any other psychiatric history. She denied history of substance abuse or dependence.   Social History: Born/Raised: [place] Education: High school graduate Occupational history: Worked at [employer] before she became disabled at age 53 due to knee replacement Marital history: Divorced (x2). Two children. Two grandchildren. Reports being very close with her family. Alcohol/Tobacco/Substances: Very minimal alcohol use (one drink approx 3x a year). Daily cigarette smoker since 56yo (currently 1/2 ppd). Has tried to quit and wants to quit but difficult with increased stress level. No illicit substance use.    Medical History:  Past Medical History:  Diagnosis Date  . Anxiety   . GERD (gastroesophageal reflux disease)   . Hyperlipidemia   . Hypertension   . Sleep apnea     Current medications:  Outpatient Encounter Prescriptions as of 10/09/2016  Medication Sig  . escitalopram (LEXAPRO) 10 MG tablet Take 10 mg by mouth daily.  . fish oil-omega-3 fatty acids 1000 MG capsule Take 2 g by mouth daily.  Marland Kitchen FLUoxetine (PROZAC) 20 MG capsule Take 1 capsule by mouth daily.  . folic acid (FOLVITE) 1 MG tablet Take 1 mg by mouth daily.  . furosemide (LASIX) 20 MG tablet   . HYDROcodone-acetaminophen (NORCO) 10-325 MG per tablet Take 1 tablet by mouth daily.  . irbesartan-hydrochlorothiazide  (AVALIDE) 300-12.5 MG per tablet Take 1 tablet by mouth daily.  . Multiple Vitamin (MULTIVITAMIN) capsule Take 1 capsule by mouth daily.  . pantoprazole (PROTONIX) 40 MG tablet Take 40 mg by mouth daily.  . simvastatin (ZOCOR) 20 MG tablet Take 20 mg by mouth daily.   No facility-administered encounter medications on file as of 10/09/2016.    She is no longer taking Lexapro; this was switched to Prozac.  Current Examination:  Behavioral Observations:   Appearance: Casually dressed and appropriately groomed Gait: Ambulated independently, no abnormalities observed Speech: Fluent; normal rate, rhythm and volume Thought process: Linear, goal directed Affect: Full, anxious, tearful Interpersonal: Pleasant, appropriate Orientation: Oriented to all spheres. Accurately named the current President and his predecessor.  Tests Administered: . Test of Premorbid Functioning (TOPF) . Wechsler Adult Intelligence Scale-Fourth Edition (WAIS-IV): Similarities, Block Design, Matrix Reasoning, Arithmetic, Symbol Search, Coding and Digit Span subtests . Wechsler Memory Scale-Fourth Edition (WMS-IV) Adult Version (ages 29-69): Logical Memory I, II and Recognition subtests  . [employer] Verbal Learning Test - 2nd Edition (CVLT-2) Short Form . [employer] (WCST) . Repeatable Battery for the Assessment of Neuropsychological Status (RBANS) Form A:  Figure Copy and Figure Recall subtests and Semantic Fluency subtest . Neuropsychological Assessment  Battery (NAB) Language Module, Form 1:  Naming and Bill Payment subtests . Controlled Oral Word Association Test (COWAT) . Trail Making Test A and B . Beck Depression Inventory - Second edition (BDI-II) . Personality Assessment Inventory (PAI)  Test Results: Note: Standardized scores are presented only for use by appropriately trained professionals and to allow for any future test-retest comparison. These scores should not be interpreted without  consideration of all the information that is contained in the rest of the report. The most recent standardization samples from the test publisher or other sources were used whenever possible to derive standard scores; scores were corrected for age, gender, ethnicity and education when available.   Test Scores:  Test Name Raw Score Standardized Score Descriptor  TOPF 18/70 SS= 77 Borderline  WAIS-IV Subtests     Similarities 20/36 ss= 8 Average  Block Design 28/66 ss= 8 Average  Matrix Reasoning 7/26 ss= 5 Borderline  Arithmetic 9/22 ss= 6 Low average  Symbol Search 18/60 ss= 6 Low average  Coding 35/135 ss= 5 Borderline  Digit Span 18/48 ss= 6 Low average  WAIS-IV Index Scores     Working Memory  SS= 77 Borderline  Processing Speed  SS= 76 Borderline  WMS-IV Subtests     LM I 15/50 ss= 5 Borderline  LM II 12/50 ss= 6 Low average  LM II Recognition 22/30 Cum %: 17-25 Below average  CVLT-II Scores     Trial 1 4/9 Z= -1.5 Borderline  Trial 4 6/9 Z= -2.5 Impaired  Trials 1-4 total 23/36 T= 34 Borderline  SD Free Recall 5/9 Z= -2 Impaired  LD Free Recall 4/9 Z= -2 Impaired  LD Cued Recall 5/9 Z= -1.5 Borderline  Recognition Discriminability 9/9 hits, 2 false positives Z= -0.5 Average  Forced Choice Recognition 9/9  WNL  WCST     Total Errors 13 T= 52 Average  Perseverative Responses 8 T= 50 Average  Perseverative Errors 7 T= 51 Average  Conceptual Level Responses 50 T= 51 Average  Categories Completed 4 >16% WNL  Trials to Complete 1st Category 12 >16% WNL  Failure to Maintain Set 0  WNL  RBANS Subtest     Figure Copy 20/20 Z= 1.3 High average  Figure Recall 13/20 Z= -0.2 Average  Semantic Fluency 17/40 Z= -0.8 Low average  NAB Language subtests     Naming 28/31 T= 37 Low average  Bill Payment 17/19 T= 41 Low average  COWAT-FAS 16 T= 27 Impaired  COWAT-Animals 15 T= 39 Low average  Trail Making Test A  53" 0 errors T= 33 Borderline  Trail Making Test B  177" 1 error T=  8 Severely impaired  BDI-II 6/63  WNL  PAI  No elevated clinical scales to report      Description of Test Results:  Embedded performance validity indicators were within normal limits, and she performed within normal limits on a formal measure of memory malingering. As such, the patient's current performance on neurocognitive testing is judged to be a relatively accurate representation of her current level of neurocognitive functioning.   Premorbid verbal intellectual abilities were estimated to have been within the borderline range based on a test of word reading. Psychomotor processing speed was borderline impaired. Auditory attention and working memory were low average to borderline impaired. Visual-spatial construction was average to high average. Language abilities were within normal limits. Specifically, confrontation naming was low average, and semantic verbal fluency was low average. On a simulated bill payment task requiring multiple aspects  of expressive and receptive language, she performed within the low average range. With regard to verbal memory, encoding and acquisition of non-contextual information (i.e., word list) was borderline impaired. After a brief distracter task, free recall was impaired (5/9 items recalled). After a delay, free recall was impaired (4/9 items recalled). Cued recall was borderline impaired (5/9 items recalled). Performance on a yes/no recognition task was average. On another verbal memory test, encoding and acquisition of contextual auditory information (i.e., short stories) was borderline impaired. After a delay, free recall was low average. Performance on a yes/no recognition task was below average. With regard to non-verbal memory, delayed free recall of visual information was average. Executive functioning was variable. Mental flexibility and set-shifting were severely impaired on Trails B; she only committed one set loss error but it took her significantly  increased time to complete the task. Verbal fluency with phonemic search restrictions was impaired. Verbal abstract reasoning was average. Non-verbal abstract reasoning was borderline impaired. Deductive reasoning and problem solving skills were average.   On a self-report measure of mood (BDI-II), the patient's responses were not indicative of clinically significant depression at the present time. On a more extensive measure of psychopathology and personality (PAI), symptom validity indicators suggested positive impression management. Specifically, her pattern of responses suggests that she tends to portray herself as being relatively free of common shortcomings to which most individuals will admit, and she appears somewhat reluctant to recognize minor faults in herself.  Given this apparent tendency to repress undesirable characteristics, the interpretive hypotheses are somewhat limited.  Although there is no evidence to suggest an effort to intentionally distort the profile, the results may underrepresent the extent and degree of any significant findings in certain areas due to the client's tendency to avoid negative or unpleasant aspects of herself. Despite the level of defensiveness noted above, there are some areas where the client described problems of greater intensity than is typical of defensive patients.  These areas could indicate problems that merit further inquiry.  These areas include: disruptions in thought process; and physical signs of depression. According to her self report on the PAI (which may be somewhat distorted as described above), she describes NO significant problems in the following areas: unusual thoughts or peculiar experiences; antisocial behavior; problems with empathy; undue suspiciousness or hostility; extreme moodiness and impulsivity; unhappiness and depression; unusually elevated mood or heightened activity; marked anxiety; problematic behaviors used to manage anxiety;  difficulties with health or physical functioning.  Also, she reports NO significant problems with alcohol or drug abuse or dependence.  With respect to suicidal ideation, the patient is NOT reporting distress from thoughts of self-harm.   Clinical Impressions: Mild vascular cognitive impairment; Adjustment disorder with anxiety and depression (significant psychosocial stressors at the present time).  Results of cognitive testing suggest mild to moderate deficits in processing speed, auditory working memory, Engineer, manufacturing, and mental flexibility/set-shifting. Meanwhile, visual spatial skills, language abilities, verbal memory recognition, non-verbal memory, and deductive reasoning/problem-solving are relatively intact. This pattern of strengths and weaknesses is suggestive of disruption of frontal-subcortical or prefrontal-cerebellar networks.  Cerebellar infarcts can cause subtle cognitive changes usually related to a working memory deficit. Research studies suggest that the symptoms may be mediated by the contralateral cortical hemisphere. This patient has a history of infarct in the left cerebellar hemisphere, so mild right hemispheric dysfunction would not be surprising; this patient's testing performances are reflective of a primary working memory deficit but they are not reflective of right hemispheric dysfunction.  Clinical observations and interview revealed significant psychosocial stress, with clear anxiety and also some indications of adjustment-related depression. There was no evidence of more severe psychopathology.  Based on all the information available at this time, it is my clinical opinion that this patient's cognitive dysfunction on testing and in daily life is likely related to a combination of psychosocial stress/anxiety and cerebrovascular disease (chronic microvascular ischemia, history of cerebellar infarct). Improvement in stress management and anxiety would likely  produce at least some benefit in cognitive functioning.   Recommendations/Plan: Based on the findings of the present evaluation, the following recommendations are offered:  1. Counseling for stress/anxiety --  We spoke at length about the importance of stress management and the benefits of counseling in learning strategies for stress management. Her boyfriend was very supportive of this. She accepted a referral to [facility]. She is motivated to do this important work. 2. Optimal control of vascular risk factors is essential to reduce the risk of cognitive decline (e.g, hypertension, hyperlipidemia, smoking, cardiovascular exercise, *stress management, CPAP- revisit this).  3. Behavioral/compensatory strategies to enhance cognitive functioning were reviewed with the patient.  Feedback to Patient: Jenna Hancock and her boyfriend returned for a feedback appointment on 10/09/2016 to review the results of her neuropsychological evaluation with this provider. 30 minutes face-to-face time was spent reviewing her test results, my impressions and my recommendations as detailed above.    Total time spent on this patient's case: [id number] unit for interview with psychologist; [phone] units of testing by psychometrician under psychologist's supervision; [phone] units for medical record review, scoring of neuropsychological tests, interpretation of test results, preparation of this report, and review of results to the patient by psychologist.      Thank you for your referral of Jenna Hancock. Please feel free to contact me if you have any questions or concerns regarding this report.

## 2016-10-09 ENCOUNTER — Encounter: Payer: Self-pay | Admitting: Psychology

## 2016-10-09 ENCOUNTER — Ambulatory Visit (INDEPENDENT_AMBULATORY_CARE_PROVIDER_SITE_OTHER): Payer: Medicare PPO | Admitting: Psychology

## 2016-10-09 DIAGNOSIS — F4322 Adjustment disorder with anxiety: Secondary | ICD-10-CM

## 2016-10-09 DIAGNOSIS — I63212 Cerebral infarction due to unspecified occlusion or stenosis of left vertebral arteries: Secondary | ICD-10-CM

## 2016-10-09 DIAGNOSIS — G3184 Mild cognitive impairment, so stated: Secondary | ICD-10-CM

## 2016-10-09 NOTE — Patient Instructions (Signed)
Vascular cognitive impairment is a change in thinking skills as a result of conditions that block or reduce blood flow to various regions of the brain. To be healthy and function properly, brain cells need an adequate supply of oxygen and nutrients. Oxygen and nutrients are delivered to brain cells via the blood, which travels through a network of vessels called the vascular system. If the vascular system becomes damaged, brain cells cannot get the oxygen and nutrients they need and they will eventually die.   This decreased oxygen to the brain very often causes changes to the small vessels of the brain and can result in both subtle and more noticeable thinking and memory problems. These thinking difficulties can begin as mild changes that worsen gradually over time as a result of multiple small strokes or small vessel ischemic disease. Small vessel ischemic disease occurs when the small blood vessels that lead to the brain do not receive an adequate supply of blood over a relatively long period of time. Small vessel ischemic disease results from a history of multiple vascular risk factors (see below). It is also known by several other terms such as "microvascular disease" and "white matter disease". Common early signs of small vessel ischemic disease are reduced ability to pay attention, slowed information processing, difficulty finding the right words, and impaired planning and judgment.   There are a number of conditions that can damage the vascular system, including high blood pressure, high cholesterol, heart problems, and diabetes. Smoking, obesity, heavy alcohol use, and stress can also result in damage to the vascular system. All of these variables are referred to as "vascular risk factors." The more risk factors one has, the greater the likelihood that he or she will experience problems in thinking and memory.   Reducing Risk Any condition that damages blood vessels anywhere in the body can cause  brain changes linked to vascular cognitive impairment. However, the following strategies can help to protect the brain and reduce one's risk of cognitive decline:    . Don't smoke . Keep blood pressure, cholesterol and blood sugar within recommended limits . Engage in consistent, safe cardiovascular exercise . Maintain compliance with prescribed medications . Eat a healthy, balanced diet . Maintain a healthy weight . Limit alcohol consumption     How STRESS and ANXIETY affect your ability to pay attention  . In times of stress, it is common to have difficulty concentrating, paying attention and making decisions . Various anxiety disorders also interfere with attention and concentration . Problems with attention and concentration can disrupt the process of learning and making new memories, which can make it seem like there is a problem with your memory. In your daily life, you may experience this disruption as forgetting names and appointments, misplacing items, and needing to make lists for shopping and errands  . Regardless of the test scores on neuropsychological evaluation, psychosocial stress can interfere with the ability to make use of your cognitive resources and function optimally across settings such as work or school, maintaining the home and responsibilities, and personal relationships  . Assessment and monitoring of symptoms o A thorough psychological evaluation is important in order to assess current symptoms and determine the best treatment o The assessment provides a starting point to monitor symptom improvement  . Treatment o Psychotherapy: Individual and group psychotherapy have been shown to be effective in improving the symptoms of depression and anxiety disorders, and typically cognitive difficulties improve with symptom reduction o Mindfulness based interventions have been  shown to be effective in improving stress management o Behavioral strategies for improving  attention and concentration in your daily life may be helpful. (see below)   . Pleasurable activities, exercise, daily structure o People who have high stress levels often stop engaging in activities that they enjoy, which can have an additional negative effect on their mood. It is recommended that all patients set aside time in their schedule to engage in pleasurable activities because this has been shown to help alleviate many types of symptoms. Examples of pleasurable activities include taking a walk/spending time outdoors, reading or seeing a movie, spending time with friends and/or family, listening to music, and volunteering o In addition to the numerous physical health benefits of exercise, there are many psychological and cognitive benefits as well.  o Maintaining a regular schedule is also recommended, such as setting regular sleep/wake times and scheduling activities such as social outings    Strategies to enhance cognitive functioning Attention, concentration, memory encoding and consolidation    . Make a plan and be prepared o If you find that you are more attentive at certain times of the day (i.e., the morning), plan important activities and discussions at that time o Determine which activities take the most time and which are most important, then prioritize your "to do list" based on this information o Break tasks into simpler parts, understand the steps involved before starting o Rehearse the steps mentally or write them down. If you write them down, you can use this as a checklist to check off as you complete them. o Visualize completing the task  . Use external aids  o Write everything down that you do not need to know or work with right now. Don't store extra information in your brain that you don't need right now.  o Use a calendar or planner to make checklists, due dates and "to do" lists. o Use ONLY ONE calendar or planner and look at it frequently o Set alarms for  important deadlines or appointments  . Minimize interruptions and distractions  o Find a good work environment, e.g., quiet room with a desk, close curtains, use earplugs, mask sounds with a fan or white noise machine o Turn off cell phone and/or email alerts during important tasks. In fact, it is helpful to schedule a block of time each day where you limit phone and email interruptions and focus on just the more detailed work you have. o Try to minimize the amount of background noise (i.e., television, music) when engaged in important tasks or conversations with others (note that some individuals find soft background music helpful in minimize distraction, so you may need to experiment with optimal level of noise for specific situations)  . Use active effort = consciously attending to details, closely analyzing o Failures of encoding may reflect failure to attend to one's own actions o Be prepared to work more slowly than you usually do  o When reading, allow time for re-reading sections  o Check your work for errors  . Avoid multitasking o Do not attempt to complete more than one task at one time. Focus on one task until it is completed and then move on to the next one. o Avoid other activities while engaged in important tasks, such as talking on the phone while balancing the checkbook, making a shopping list during a meeting.   . Use self-talk during tasks o Repeat the steps of the activity to yourself as you complete them o Talk to  yourself about your progress o This helps you maintain focus on the task and makes it easier to remember completing the task (Similar to "active effort" above)  . Conserve energy o Conserve energy to avoid fatigue and its effects on cognition - Get enough sleep - Pace yourself  and make sure to take breaks - Be open to receiving help - Exercise for increased energy  . Conversational vigilance = paying attention during a conversation  o Listen actively:  focus on the speaker and position yourself so that you can clearly hear the him/her, and have open/relaxed posture  o Eye contact: Maintaining eye contact with the person you are speaking with may increase the likelihood that important information is properly received  o Ask questions: Ask questions for clarification (e.g., request that the speaker explain something in a different way) or ask for information to be repeated if you become distracted, or if you do not hear or understand something during a conversation  o Paraphrase: Summarize or repeat back important information from a conversation in your own words to facilitate communication and ensure that you have heard correctly and understand

## 2016-10-14 ENCOUNTER — Telehealth: Payer: Self-pay | Admitting: Internal Medicine

## 2016-10-14 NOTE — Telephone Encounter (Signed)
CY  Please Advise-  Spoke with pt on the phone, and she stated that she has not been on her cpap machine since 2016. She stated she only had it for 3 months and just could not tolerate sleeping with it. She wanted to discuss getting oxygen to sleep with at night. Offered pt ov with you tomorrow but she states she is unable to make that appt and you next open slot is not until Aug. Please advise if pt and be fit in or see an NP

## 2016-10-14 NOTE — Telephone Encounter (Signed)
Please have patient come in to see CY to review options; any RNA slot is okay as long as there are not multiple Consults prior to. Thanks.

## 2016-10-14 NOTE — Telephone Encounter (Signed)
Oxygen is a separate issue, not an alternative treatment for sleep apnea.  Katie- when might we be able to get her back in to review options?  Not an emergency.

## 2016-10-14 NOTE — Telephone Encounter (Signed)
lmomtcb x1 

## 2016-10-16 NOTE — Telephone Encounter (Signed)
lmtcb for pt.  

## 2016-10-17 NOTE — Telephone Encounter (Signed)
lmomtcb x 3  

## 2016-10-20 NOTE — Telephone Encounter (Signed)
Attempted to contact the pt x 4.  Will sign off of this message at this time.

## 2016-10-26 ENCOUNTER — Encounter (HOSPITAL_COMMUNITY): Payer: Self-pay | Admitting: Emergency Medicine

## 2016-10-26 ENCOUNTER — Emergency Department (HOSPITAL_COMMUNITY): Payer: Medicare PPO

## 2016-10-26 ENCOUNTER — Emergency Department (HOSPITAL_COMMUNITY)
Admission: EM | Admit: 2016-10-26 | Discharge: 2016-10-27 | Disposition: A | Discharge status: Home / Self Care | Payer: Medicare PPO | Attending: Emergency Medicine | Admitting: Emergency Medicine

## 2016-10-26 DIAGNOSIS — Y999 Unspecified external cause status: Secondary | ICD-10-CM | POA: Insufficient documentation

## 2016-10-26 DIAGNOSIS — J988 Other specified respiratory disorders: Secondary | ICD-10-CM | POA: Insufficient documentation

## 2016-10-26 DIAGNOSIS — B9789 Other viral agents as the cause of diseases classified elsewhere: Secondary | ICD-10-CM

## 2016-10-26 DIAGNOSIS — S30861A Insect bite (nonvenomous) of abdominal wall, initial encounter: Secondary | ICD-10-CM | POA: Insufficient documentation

## 2016-10-26 DIAGNOSIS — Z79899 Other long term (current) drug therapy: Secondary | ICD-10-CM | POA: Insufficient documentation

## 2016-10-26 DIAGNOSIS — E876 Hypokalemia: Secondary | ICD-10-CM

## 2016-10-26 DIAGNOSIS — F17210 Nicotine dependence, cigarettes, uncomplicated: Secondary | ICD-10-CM | POA: Diagnosis not present

## 2016-10-26 DIAGNOSIS — W57XXXA Bitten or stung by nonvenomous insect and other nonvenomous arthropods, initial encounter: Secondary | ICD-10-CM

## 2016-10-26 DIAGNOSIS — Y939 Activity, unspecified: Secondary | ICD-10-CM | POA: Diagnosis not present

## 2016-10-26 DIAGNOSIS — Y929 Unspecified place or not applicable: Secondary | ICD-10-CM | POA: Diagnosis not present

## 2016-10-26 DIAGNOSIS — I10 Essential (primary) hypertension: Secondary | ICD-10-CM | POA: Diagnosis not present

## 2016-10-26 DIAGNOSIS — R509 Fever, unspecified: Secondary | ICD-10-CM | POA: Diagnosis present

## 2016-10-26 DIAGNOSIS — R34 Anuria and oliguria: Secondary | ICD-10-CM | POA: Insufficient documentation

## 2016-10-26 LAB — TROPONIN I: Troponin I: <0.03 ng/mL (ref <0.03)

## 2016-10-26 LAB — CBC WITH DIFFERENTIAL/PLATELET
Basophils Absolute: 0 10*3/uL (ref 0.0–0.1)
Basophils Relative: 0 %
EOS PCT: 1 %
Eosinophils Absolute: 0.1 10*3/uL (ref 0.0–0.7)
HEMATOCRIT: 37.3 % (ref 36.0–46.0)
Hemoglobin: 12.7 g/dL (ref 12.0–15.0)
LYMPHS ABS: 2 10*3/uL (ref 0.7–4.0)
LYMPHS PCT: 14 %
MCH: 31.3 pg (ref 26.0–34.0)
MCHC: 34 g/dL (ref 30.0–36.0)
MCV: 91.9 fL (ref 78.0–100.0)
MONO ABS: 1.1 10*3/uL — AB (ref 0.1–1.0)
MONOS PCT: 8 %
NEUTROS ABS: 10.6 10*3/uL — AB (ref 1.7–7.7)
Neutrophils Relative %: 77 %
Platelets: 242 10*3/uL (ref 150–400)
RBC: 4.06 MIL/uL (ref 3.87–5.11)
RDW: 11.9 % (ref 11.5–15.5)
WBC: 13.8 10*3/uL — ABNORMAL HIGH (ref 4.0–10.5)

## 2016-10-26 LAB — URINALYSIS, ROUTINE W REFLEX MICROSCOPIC
BILIRUBIN URINE: NEGATIVE
Glucose, UA: NEGATIVE mg/dL
HGB URINE DIPSTICK: NEGATIVE
KETONES UR: NEGATIVE mg/dL
LEUKOCYTES UA: NEGATIVE
Nitrite: NEGATIVE
PH: 5 (ref 5.0–8.0)
Protein, ur: NEGATIVE mg/dL
Specific Gravity, Urine: 1.031 — ABNORMAL HIGH (ref 1.005–1.030)

## 2016-10-26 LAB — BASIC METABOLIC PANEL
Anion gap: 10 (ref 5–15)
BUN: 18 mg/dL (ref 6–20)
CHLORIDE: 102 mmol/L (ref 101–111)
CO2: 25 mmol/L (ref 22–32)
CREATININE: 0.93 mg/dL (ref 0.44–1.00)
Calcium: 9.1 mg/dL (ref 8.9–10.3)
GFR calc non Af Amer: >60 mL/min (ref >60)
Glucose, Bld: 139 mg/dL — ABNORMAL HIGH (ref 65–99)
Potassium: 3.1 mmol/L — ABNORMAL LOW (ref 3.5–5.1)
SODIUM: 137 mmol/L (ref 135–145)

## 2016-10-26 MED ORDER — ALBUTEROL SULFATE (2.5 MG/3ML) 0.083% IN NEBU
2.5000 mg | INHALATION_SOLUTION | Freq: Once | RESPIRATORY_TRACT | Status: AC
  Administered 2016-10-26: 2.5 mg via RESPIRATORY_TRACT
  Filled 2016-10-26: qty 3

## 2016-10-26 MED ORDER — IPRATROPIUM-ALBUTEROL 0.5-2.5 (3) MG/3ML IN SOLN
3.0000 mL | Freq: Once | RESPIRATORY_TRACT | Status: AC
  Administered 2016-10-26: 3 mL via RESPIRATORY_TRACT
  Filled 2016-10-26: qty 3

## 2016-10-26 NOTE — ED Notes (Signed)
Pt given ginger ale.

## 2016-10-26 NOTE — ED Triage Notes (Signed)
Congestion, cough, body aches, fever, no energy and no appetite since Tuesday.

## 2016-10-26 NOTE — ED Provider Notes (Signed)
[facility] DEPT Provider Note   CSN: [id number] Arrival date & time: 10/26/16  2112   By signing my name below, I, Jenna Hancock, attest that this documentation has been prepared under the direction and in the presence of [name], PA-C. Electronically signed, Jenna Hancock, ED Scribe. 10/26/16. 9:47 PM.   History   Chief Complaint Chief Complaint  Patient presents with  . Fever   The history is provided by the patient and medical records. No language interpreter was used.    Jenna Hancock is a 56 y.o. female with h/o pneumonia, sinus infections and sinus nasal surgery, who presents to the Emergency Department with concern for new onset fever x 2 days. Associated decreased appetite, hot and cold chills, fatigue,nasal congestion, sinus pain and pressure, cough, chest tightness, SOB on exertion, nausea, decreased and darker urination from baseline, generalized body aches and weakness noted. She states she can only eat fruit d/t nausea. Pt states she removed 2 ticks from her body last week and another the week prior; 1 on the abdomen and she cannot recall the location of the other. Pt allegedly very active at baseline. She states she has been seen by 2 doctors since the onset of her symptoms and she was prescribed Avelox 2 days ago. Pt states that she normally takes this for this for sinus infections with significant relief. She has taken Tylenol every 4 hours with temporary relief to fever, though it has returned a few hours after this intervention. Tylenol last taken 1 hour PTA. No rash, dysuria, no focal joint pains, V/D, leg swelling, chest pain, other h/o respiratory or heart disorders noted. No other complaints at this time.   Past Medical History:  Diagnosis Date  . Anxiety   . GERD (gastroesophageal reflux disease)   . Hyperlipidemia   . Hypertension   . Sleep apnea     Patient Active Problem List   Diagnosis Date Noted  . Obstructive sleep apnea 06/25/2014    Past  Surgical History:  Procedure Laterality Date  . APPENDECTOMY    . JOINT REPLACEMENT    . NASAL SINUS SURGERY      OB History    Gravida Para Term Preterm AB Living             2   SAB TAB Ectopic Multiple Live Births                   Home Medications    Prior to Admission medications   Medication Sig Start Date End Date Taking? Authorizing Provider  escitalopram (LEXAPRO) 10 MG tablet Take 10 mg by mouth daily.    [provider]  fish oil-omega-3 fatty acids 1000 MG capsule Take 2 g by mouth daily.    [provider]  FLUoxetine (PROZAC) 20 MG capsule Take 1 capsule by mouth daily. 07/31/16   [provider]  folic acid (FOLVITE) 1 MG tablet Take 1 mg by mouth daily.    [provider]  furosemide (LASIX) 20 MG tablet  02/28/16   [provider]  HYDROcodone-acetaminophen (NORCO) 10-325 MG per tablet Take 1 tablet by mouth daily.    [provider]  irbesartan-hydrochlorothiazide (AVALIDE) 300-12.5 MG per tablet Take 1 tablet by mouth daily.    [provider]  Multiple Vitamin (MULTIVITAMIN) capsule Take 1 capsule by mouth daily.    [provider]  pantoprazole (PROTONIX) 40 MG tablet Take 40 mg by mouth daily.    [provider]  simvastatin (ZOCOR) 20 MG tablet Take 20 mg by mouth daily.    [provider]    Family History Family History  Problem Relation Age of Onset  . Diabetes Father   . Stroke Father   . Heart disease Father   . Lung cancer Mother     Social History Social History  Substance Use Topics  . Smoking status: Current Every Day Smoker    Packs/day: 0.50    Years: 34.00    Types: Cigarettes  . Smokeless tobacco: Never Used  . Alcohol use No     Comment: (maybe one drink 3x a year)     Allergies   Bactrim and Sulfa antibiotics   Review of Systems Review of Systems  Constitutional: Positive for appetite change, chills, fatigue and fever.  HENT:  Positive for congestion, sinus pain and sinus pressure. Negative for rhinorrhea, sore throat and trouble swallowing.   Respiratory: Positive for cough, chest tightness and shortness of breath.   Cardiovascular: Negative for chest pain and leg swelling.  Gastrointestinal: Positive for nausea. Negative for abdominal pain, diarrhea and vomiting.  Genitourinary: Positive for decreased urine volume. Negative for dysuria and flank pain.  Musculoskeletal: Positive for arthralgias and myalgias.  Skin: Negative for rash.  Neurological: Positive for weakness. Negative for dizziness, syncope, numbness and headaches.  All other systems reviewed and are negative.   Physical Exam Updated Vital Signs BP 135/60 (BP Location: Right Arm)   Pulse 84   Temp (!) 100.9 F (38.3 C) (Oral)   Resp 18   Ht 5\' 4"  (1.626 m)   Wt 215 lb (97.5 kg)   SpO2 94%   BMI 36.90 kg/m   Physical Exam  Constitutional: She is oriented to person, place, and time. She appears well-developed and well-nourished. No distress.  HENT:  Head: Normocephalic.  Right Ear: Ear canal normal. Tympanic membrane is not bulging. A middle ear effusion is present.  Left Ear: Ear canal normal. Tympanic membrane is not bulging. A middle ear effusion is present.  Nose: Mucosal edema present. Right sinus exhibits maxillary sinus tenderness. Left sinus exhibits maxillary sinus tenderness.  Mouth/Throat: Uvula is midline, oropharynx is clear and moist and mucous membranes are normal. No posterior oropharyngeal edema or posterior oropharyngeal erythema.  Mild effusion of the bilateral TM's, no erythema  Eyes: EOM are normal.  Neck: Normal range of motion.  Cardiovascular: Normal rate, regular rhythm, normal heart sounds and intact distal pulses.   No murmur heard. Pulmonary/Chest: Effort normal. No accessory muscle usage. No respiratory distress. She has wheezes.  Few expiratory wheezes R > L  Abdominal: Soft. She exhibits no distension. There  is no tenderness.  Musculoskeletal: Normal range of motion. She exhibits no edema.  Lymphadenopathy:    She has no cervical adenopathy.  Neurological: She is alert and oriented to person, place, and time. No sensory deficit.  Skin: Skin is warm. Capillary refill takes less than 2 seconds. No rash noted.  Psychiatric: She has a normal mood and affect.  Nursing note and vitals reviewed.    ED Treatments / Results  DIAGNOSTIC STUDIES: Oxygen Saturation is 94% on RA, adequate by my interpretation.    COORDINATION OF CARE: 9:41 PM-Discussed next steps with pt. She verbalized understanding and is agreeable with the plan. Will order CXr, breathing treatment and blood work.   Labs (all labs ordered are listed, but only abnormal results are displayed) Labs Reviewed  BASIC METABOLIC PANEL - Abnormal; Notable for the following:  Result Value   Potassium 3.1 (*)    Glucose, Bld 139 (*)    All other components within normal limits  CBC WITH DIFFERENTIAL/PLATELET - Abnormal; Notable for the following:    WBC 13.8 (*)    Neutro Abs 10.6 (*)    Monocytes Absolute 1.1 (*)    All other components within normal limits  URINALYSIS, ROUTINE W REFLEX MICROSCOPIC - Abnormal; Notable for the following:    Specific Gravity, Urine 1.031 (*)    All other components within normal limits  TROPONIN I  ROCKY MTN SPOTTED FVR ABS PNL(IGG+IGM)  B. BURGDORFI ANTIBODIES    EKG  EKG Interpretation  Date/Time:  Sunday Oct 26 2016 22:06:21 EDT Ventricular Rate:  80 PR Interval:    QRS Duration: 88 QT Interval:  360 QTC Calculation: 416 R Axis:   74 Text Interpretation:  Sinus rhythm Prominent P waves, nondiagnostic Minimal ST depression, diffuse leads When compared with ECG of 08/12/2011 No significant change was found Confirmed by [facility]  MD, Nunzio Cory [phone]) on 10/26/2016 10:29:37 PM       Radiology Dg Chest 2 View  Result Date: 10/26/2016 CLINICAL DATA:  Fever, shortness of breath, cough and  congestion. EXAM: CHEST  2 VIEW COMPARISON:  08/12/2011 and 05/05/2008 radiographs FINDINGS: Cardiomegaly and mild peribronchial thickening again noted. There is no evidence of focal airspace disease, pulmonary edema, suspicious pulmonary nodule/mass, pleural effusion, or pneumothorax. No acute bony abnormalities are identified. IMPRESSION: Cardiomegaly without evidence of acute cardiopulmonary disease. Mild chronic peribronchial thickening. Electronically Signed   By: Margarette Canada M.D.   On: 10/26/2016 22:44    Procedures Procedures (including critical care time)  Medications Ordered in ED Medications  albuterol (PROVENTIL) (2.5 MG/3ML) 0.083% nebulizer solution 2.5 mg (2.5 mg Nebulization Given 10/26/16 2338)  ipratropium-albuterol (DUONEB) 0.5-2.5 (3) MG/3ML nebulizer solution 3 mL (3 mLs Nebulization Given 10/26/16 2337)  potassium chloride SA (K-DUR,KLOR-CON) CR tablet 40 mEq (40 mEq Oral Given 10/27/16 0025)     Initial Impression / Assessment and Plan / ED Course  I have reviewed the triage vital signs and the nursing notes.  Pertinent labs & imaging results that were available during my care of the patient were reviewed by me and considered in my medical decision making (see chart for details).     Pt is non-toxic appearing.  Afebrile.  Lung sounds improved after neb.  Pt tolerating po fluids.  Mild hypokalemia treated here. Sx's felt to be viral, but since pt has hx of tick bites I will d/c the Avelox and start doxy.  She appears stable for d/c, return precautions discussed  Final Clinical Impressions(s) / ED Diagnoses   Final diagnoses:  Viral respiratory illness  Tick bite, initial encounter  Hypokalemia    New Prescriptions New Prescriptions   No medications on file    I personally performed the services described in this documentation, which was scribed in my presence. The recorded information has been reviewed and is accurate.    Kem Parkinson, PA-C 10/29/16  [place], [place], DO 11/04/16 [phone]

## 2016-10-27 MED ORDER — POTASSIUM CHLORIDE CRYS ER 20 MEQ PO TBCR
40.0000 meq | EXTENDED_RELEASE_TABLET | Freq: Once | ORAL | Status: AC
  Administered 2016-10-27: 40 meq via ORAL
  Filled 2016-10-27: qty 2

## 2016-10-27 MED ORDER — DOXYCYCLINE HYCLATE 100 MG PO CAPS: 100.0000 mg | ORAL_CAPSULE | Freq: Two times a day (BID) | ORAL | 0 refills | Status: DC

## 2016-10-27 MED ORDER — ALBUTEROL SULFATE HFA 108 (90 BASE) MCG/ACT IN AERS
2.0000 | INHALATION_SPRAY | Freq: Once | RESPIRATORY_TRACT | Status: DC
  Filled 2016-10-27: qty 6.7

## 2016-10-27 NOTE — Discharge Instructions (Signed)
1-2 puffs of the inhaler every 4-6 hrs as needed.  Drink plenty of water.  Bananas and orange juice, raisins will help raise your potassium level.  Stop the Avelox and start the doxycyline tomorrow.  Follow-up with your primary provider for recheck.  Return here for any worsening symptoms

## 2016-10-28 LAB — B. BURGDORFI ANTIBODIES: B burgdorferi Ab IgG+IgM: <0.91 {ISR} (ref 0.00–0.90)

## 2016-10-28 LAB — ROCKY MTN SPOTTED FVR ABS PNL(IGG+IGM)
RMSF IgG: NEGATIVE
RMSF IgM: 0.12 index (ref 0.00–0.89)

## 2016-11-18 ENCOUNTER — Ambulatory Visit: Payer: Medicare PPO | Admitting: Internal Medicine

## 2016-12-02 ENCOUNTER — Encounter (INDEPENDENT_AMBULATORY_CARE_PROVIDER_SITE_OTHER): Payer: Self-pay | Admitting: Internal Medicine

## 2016-12-08 ENCOUNTER — Encounter (INDEPENDENT_AMBULATORY_CARE_PROVIDER_SITE_OTHER): Payer: Self-pay

## 2016-12-08 ENCOUNTER — Ambulatory Visit (INDEPENDENT_AMBULATORY_CARE_PROVIDER_SITE_OTHER): Payer: Medicare PPO | Admitting: Internal Medicine

## 2016-12-08 ENCOUNTER — Encounter (INDEPENDENT_AMBULATORY_CARE_PROVIDER_SITE_OTHER): Payer: Self-pay | Admitting: Internal Medicine

## 2016-12-08 VITALS — BP 160/84 | HR 60 | Temp 97.7°F | Ht 64.0 in | Wt 200.7 lb

## 2016-12-08 DIAGNOSIS — A09 Infectious gastroenteritis and colitis, unspecified: Secondary | ICD-10-CM

## 2016-12-08 DIAGNOSIS — I10 Essential (primary) hypertension: Secondary | ICD-10-CM | POA: Insufficient documentation

## 2016-12-08 DIAGNOSIS — R531 Weakness: Secondary | ICD-10-CM | POA: Diagnosis not present

## 2016-12-08 DIAGNOSIS — E785 Hyperlipidemia, unspecified: Secondary | ICD-10-CM | POA: Insufficient documentation

## 2016-12-08 DIAGNOSIS — E7800 Pure hypercholesterolemia, unspecified: Secondary | ICD-10-CM | POA: Diagnosis not present

## 2016-12-08 HISTORY — DX: Pure hypercholesterolemia, unspecified: E78.00

## 2016-12-08 HISTORY — DX: Essential (primary) hypertension: I10

## 2016-12-08 LAB — CBC WITH DIFFERENTIAL/PLATELET
BASOS PCT: 0 %
Basophils Absolute: 0 cells/uL (ref 0–200)
Eosinophils Absolute: 83 cells/uL (ref 15–500)
Eosinophils Relative: 1 %
HEMATOCRIT: 43.2 % (ref 35.0–45.0)
Hemoglobin: 14.3 g/dL (ref 11.7–15.5)
LYMPHS PCT: 31 %
Lymphs Abs: 2573 cells/uL (ref 850–3900)
MCH: 30.5 pg (ref 27.0–33.0)
MCHC: 33.1 g/dL (ref 32.0–36.0)
MCV: 92.1 fL (ref 80.0–100.0)
MONO ABS: 498 {cells}/uL (ref 200–950)
MPV: 10.2 fL (ref 7.5–12.5)
Monocytes Relative: 6 %
NEUTROS PCT: 62 %
Neutro Abs: 5146 cells/uL (ref 1500–7800)
PLATELETS: 233 10*3/uL (ref 140–400)
RBC: 4.69 MIL/uL (ref 3.80–5.10)
RDW: 13 % (ref 11.0–15.0)
WBC: 8.3 10*3/uL (ref 3.8–10.8)

## 2016-12-08 MED ORDER — METRONIDAZOLE 500 MG PO TABS: 500.0000 mg | ORAL_TABLET | Freq: Two times a day (BID) | ORAL | 0 refills | Status: DC

## 2016-12-08 MED ORDER — DICYCLOMINE HCL 10 MG PO CAPS: 10.0000 mg | ORAL_CAPSULE | Freq: Two times a day (BID) | ORAL | 3 refills | Status: DC

## 2016-12-08 NOTE — Progress Notes (Addendum)
   Subjective:    Patient ID: Jenna Hancock, female    DOB: 1961/01/03, 56 y.o.   MRN: [id number]  HPI Referred by Dr. Harmon Pier for chronic diarrhea. Occurs usually after eating. She has had diarrhea since May. She had a sinus infection and was treated with Doxycycline ( She also had a tick bite) . She developed diarrhea and became weak.  She says the diarrhea is better. She says her stools are loose but do have some form.   She does not feel any better.  She having about 1-2 stools a day. She says she thinks she has lost about 22 pounds.  C diff was negative 11/28/2016).  She denies any fever. Before this incident she was having 1 BM a day.   Per records last colonoscopy in 2012 and was normal (Dr. Posey Pronto).  11/28/2016 Hand H 14.2 and 41.5  Review of Systems Past Medical History:  Diagnosis Date  . Anxiety   . Essential hypertension, benign 12/08/2016  . GERD (gastroesophageal reflux disease)   . High cholesterol 12/08/2016  . Hyperlipidemia   . Hypertension   . Sleep apnea     Past Surgical History:  Procedure Laterality Date  . APPENDECTOMY    . JOINT REPLACEMENT     rt knee 11 yrs ago  . NASAL SINUS SURGERY      Allergies  Allergen Reactions  . Bactrim Rash  . Sulfa Antibiotics Rash    Current Outpatient Prescriptions on File Prior to Visit  Medication Sig Dispense Refill  . fish oil-omega-3 fatty acids 1000 MG capsule Take 2 g by mouth daily.    Marland Kitchen FLUoxetine (PROZAC) 20 MG capsule Take 1 capsule by mouth daily.    . furosemide (LASIX) 20 MG tablet     . HYDROcodone-acetaminophen (NORCO) 10-325 MG per tablet Take 1 tablet by mouth daily.    . irbesartan-hydrochlorothiazide (AVALIDE) 300-12.5 MG per tablet Take 1 tablet by mouth daily.    . Multiple Vitamin (MULTIVITAMIN) capsule Take 1 capsule by mouth daily.    . pantoprazole (PROTONIX) 40 MG tablet Take 40 mg by mouth daily.    . simvastatin (ZOCOR) 20 MG tablet Take 20 mg by mouth daily.    Marland Kitchen doxycycline  (VIBRAMYCIN) 100 MG capsule Take 1 capsule (100 mg total) by mouth 2 (two) times daily. 20 capsule 0  . escitalopram (LEXAPRO) 10 MG tablet Take 10 mg by mouth daily.     No current facility-administered medications on file prior to visit.         Objective:   Physical Exam Blood pressure (!) 160/84, pulse 60, temperature 97.7 F (36.5 C), height '5\' 4"'$  (1.626 m), weight 200 lb 11.2 oz (91 kg). Alert and oriented. Skin warm and dry. Oral mucosa is moist.   . Sclera anicteric, conjunctivae is pink. Thyroid not enlarged. No cervical lymphadenopathy. Lungs clear. Heart regular rate and rhythm.  Abdomen is soft. Bowel sounds are positive. No hepatomegaly. No abdominal masses felt. Slight tenderness periumbilical.   No edema to lower extremities.         Assessment & Plan:  Chronic diarrhea/ ? Infectious.  Possible antibiotic induced.  C-diff negative.  CBC, Met 7. Rx for Flagyl x 10 days. Rx for Dicyclomine '10mg'$  BID.  Further recommendations to follow. Will see how she does on the Flagyl

## 2016-12-08 NOTE — Patient Instructions (Signed)
Rx for Flagyl Labs today

## 2016-12-09 LAB — COMPREHENSIVE METABOLIC PANEL
ALK PHOS: 80 U/L (ref 33–130)
ALT: 20 U/L (ref 6–29)
AST: 21 U/L (ref 10–35)
Albumin: 4.4 g/dL (ref 3.6–5.1)
BILIRUBIN TOTAL: 0.5 mg/dL (ref 0.2–1.2)
BUN: 13 mg/dL (ref 7–25)
CALCIUM: 9.9 mg/dL (ref 8.6–10.4)
CO2: 23 mmol/L (ref 20–31)
Chloride: 104 mmol/L (ref 98–110)
Creat: 0.9 mg/dL (ref 0.50–1.05)
Glucose, Bld: 88 mg/dL (ref 65–99)
Potassium: 4.4 mmol/L (ref 3.5–5.3)
Sodium: 139 mmol/L (ref 135–146)
Total Protein: 6.9 g/dL (ref 6.1–8.1)

## 2016-12-15 ENCOUNTER — Telehealth (INDEPENDENT_AMBULATORY_CARE_PROVIDER_SITE_OTHER): Payer: Self-pay | Admitting: Internal Medicine

## 2016-12-15 DIAGNOSIS — R197 Diarrhea, unspecified: Secondary | ICD-10-CM

## 2016-12-15 NOTE — Telephone Encounter (Signed)
GI pathogen ordered 

## 2016-12-18 ENCOUNTER — Telehealth: Payer: Self-pay | Admitting: Neurology

## 2016-12-18 NOTE — Telephone Encounter (Signed)
Spoke with patient and gave her number to contact behavioral health. Referral was entered by Dr. Si Raider.

## 2016-12-18 NOTE — Telephone Encounter (Signed)
PT called and said she was supposed to be referred to a counselor and has not heard from anyone yet

## 2016-12-22 ENCOUNTER — Encounter (INDEPENDENT_AMBULATORY_CARE_PROVIDER_SITE_OTHER): Payer: Self-pay

## 2016-12-22 LAB — GASTROINTESTINAL PATHOGEN PANEL PCR
C. difficile Tox A/B, PCR: NOT DETECTED
CRYPTOSPORIDIUM, PCR: NOT DETECTED
Campylobacter, PCR: NOT DETECTED
E coli (ETEC) LT/ST PCR: NOT DETECTED
E coli (STEC) stx1/stx2, PCR: NOT DETECTED
E coli 0157, PCR: NOT DETECTED
GIARDIA LAMBLIA, PCR: NOT DETECTED
NOROVIRUS, PCR: NOT DETECTED
ROTAVIRUS, PCR: NOT DETECTED
Salmonella, PCR: NOT DETECTED
Shigella, PCR: NOT DETECTED

## 2017-01-22 ENCOUNTER — Emergency Department (HOSPITAL_COMMUNITY): Payer: Medicare PPO

## 2017-01-22 ENCOUNTER — Emergency Department (HOSPITAL_COMMUNITY)
Admission: EM | Admit: 2017-01-22 | Discharge: 2017-01-22 | Disposition: A | Discharge status: Home / Self Care | Payer: Medicare PPO | Attending: Emergency Medicine | Admitting: Emergency Medicine

## 2017-01-22 ENCOUNTER — Encounter (HOSPITAL_COMMUNITY): Payer: Self-pay | Admitting: Emergency Medicine

## 2017-01-22 DIAGNOSIS — R0600 Dyspnea, unspecified: Secondary | ICD-10-CM | POA: Insufficient documentation

## 2017-01-22 DIAGNOSIS — R0789 Other chest pain: Secondary | ICD-10-CM

## 2017-01-22 DIAGNOSIS — Z79899 Other long term (current) drug therapy: Secondary | ICD-10-CM | POA: Diagnosis not present

## 2017-01-22 DIAGNOSIS — I10 Essential (primary) hypertension: Secondary | ICD-10-CM | POA: Insufficient documentation

## 2017-01-22 DIAGNOSIS — R079 Chest pain, unspecified: Secondary | ICD-10-CM | POA: Diagnosis present

## 2017-01-22 DIAGNOSIS — F17210 Nicotine dependence, cigarettes, uncomplicated: Secondary | ICD-10-CM | POA: Diagnosis not present

## 2017-01-22 HISTORY — DX: Transient cerebral ischemic attack, unspecified: G45.9

## 2017-01-22 LAB — CBC WITH DIFFERENTIAL/PLATELET
Basophils Absolute: 0 10*3/uL (ref 0.0–0.1)
Basophils Relative: 0 %
EOS ABS: 0.3 10*3/uL (ref 0.0–0.7)
EOS PCT: 3 %
HCT: 44.9 % (ref 36.0–46.0)
Hemoglobin: 15.3 g/dL — ABNORMAL HIGH (ref 12.0–15.0)
LYMPHS ABS: 2.3 10*3/uL (ref 0.7–4.0)
Lymphocytes Relative: 22 %
MCH: 31 pg (ref 26.0–34.0)
MCHC: 34.1 g/dL (ref 30.0–36.0)
MCV: 90.9 fL (ref 78.0–100.0)
MONO ABS: 1.1 10*3/uL — AB (ref 0.1–1.0)
Monocytes Relative: 11 %
Neutro Abs: 6.5 10*3/uL (ref 1.7–7.7)
Neutrophils Relative %: 64 %
PLATELETS: 205 10*3/uL (ref 150–400)
RBC: 4.94 MIL/uL (ref 3.87–5.11)
RDW: 12.2 % (ref 11.5–15.5)
WBC: 10.2 10*3/uL (ref 4.0–10.5)

## 2017-01-22 LAB — COMPREHENSIVE METABOLIC PANEL
ALBUMIN: 4.3 g/dL (ref 3.5–5.0)
ALT: 23 U/L (ref 14–54)
AST: 23 U/L (ref 15–41)
Alkaline Phosphatase: 94 U/L (ref 38–126)
Anion gap: 11 (ref 5–15)
BUN: 23 mg/dL — AB (ref 6–20)
CHLORIDE: 102 mmol/L (ref 101–111)
CO2: 26 mmol/L (ref 22–32)
Calcium: 9.8 mg/dL (ref 8.9–10.3)
Creatinine, Ser: 1.16 mg/dL — ABNORMAL HIGH (ref 0.44–1.00)
GFR calc Af Amer: 60 mL/min — ABNORMAL LOW (ref >60)
GFR, EST NON AFRICAN AMERICAN: 52 mL/min — AB (ref >60)
GLUCOSE: 105 mg/dL — AB (ref 65–99)
Potassium: 3.8 mmol/L (ref 3.5–5.1)
Sodium: 139 mmol/L (ref 135–145)
Total Bilirubin: 0.7 mg/dL (ref 0.3–1.2)
Total Protein: 7.8 g/dL (ref 6.5–8.1)

## 2017-01-22 LAB — BRAIN NATRIURETIC PEPTIDE: B NATRIURETIC PEPTIDE 5: 27 pg/mL (ref 0.0–100.0)

## 2017-01-22 LAB — TROPONIN I: Troponin I: <0.03 ng/mL (ref <0.03)

## 2017-01-22 MED ORDER — SODIUM CHLORIDE 0.9 % IV BOLUS (SEPSIS)
1000.0000 mL | Freq: Once | INTRAVENOUS | Status: AC
  Administered 2017-01-22: 1000 mL via INTRAVENOUS

## 2017-01-22 MED ORDER — ALBUTEROL SULFATE HFA 108 (90 BASE) MCG/ACT IN AERS
2.0000 | INHALATION_SPRAY | Freq: Once | RESPIRATORY_TRACT | Status: AC
  Administered 2017-01-22: 2 via RESPIRATORY_TRACT
  Filled 2017-01-22: qty 6.7

## 2017-01-22 MED ORDER — PREDNISONE 20 MG PO TABS: 40.0000 mg | ORAL_TABLET | Freq: Every day | ORAL | 0 refills | Status: DC

## 2017-01-22 MED ORDER — DEXAMETHASONE SODIUM PHOSPHATE 10 MG/ML IJ SOLN
10.0000 mg | Freq: Once | INTRAMUSCULAR | Status: AC
  Administered 2017-01-22: 10 mg via INTRAVENOUS
  Filled 2017-01-22: qty 1

## 2017-01-22 NOTE — ED Provider Notes (Signed)
[facility] DEPT Provider Note   CSN: [id number] Arrival date & time: 01/22/17  1235     History   Chief Complaint Chief Complaint  Patient presents with  . Chest Pain    HPI Jenna Hancock is a 56 y.o. female.  HPI  Patient presents after an episode of dyspnea, chest pressure lightheadedness. This occurred today, and the patient was in her usual state of health prior to the event. Recall getting outside, mowing the grass, and a hot, humid day  She felt relatively sudden onset dyspnea, when inside to rest, and all their family members took her blood pressure, found to be depressed, with systolic 80. After resting for some time the patient improved, and without clear intervention had resolution of her hypotension, improvement in her dyspnea, and chest pain. Currently the patient has minimal complaints, states that she feels generally better, though tired.   Past Medical History:  Diagnosis Date  . Anxiety   . Essential hypertension, benign 12/08/2016  . GERD (gastroesophageal reflux disease)   . High cholesterol 12/08/2016  . Hyperlipidemia   . Hypertension   . Sleep apnea   . TIA (transient ischemic attack) 2017    Patient Active Problem List   Diagnosis Date Noted  . Essential hypertension, benign 12/08/2016  . High cholesterol 12/08/2016  . Obstructive sleep apnea 06/25/2014    Past Surgical History:  Procedure Laterality Date  . APPENDECTOMY    . JOINT REPLACEMENT     rt knee 11 yrs ago  . NASAL SINUS SURGERY      OB History    Gravida Para Term Preterm AB Living             2   SAB TAB Ectopic Multiple Live Births                   Home Medications    Prior to Admission medications   Medication Sig Start Date End Date Taking? Authorizing Provider  ALPRAZolam Duanne Moron) 0.5 MG tablet Take 0.5 mg by mouth as needed for anxiety.   Yes [provider]  Ascorbic Acid (VITAMIN C) 1000 MG tablet Take 1,000 mg by mouth daily.   Yes [provider]  escitalopram (LEXAPRO) 10 MG tablet Take 10 mg by mouth daily.   Yes [provider]  fish oil-omega-3 fatty acids 1000 MG capsule Take 2 g by mouth daily.   Yes [provider]  FLUoxetine (PROZAC) 20 MG capsule Take 1 capsule by mouth daily. 07/31/16  Yes [provider]  HYDROcodone-acetaminophen (NORCO) 10-325 MG per tablet Take 1 tablet by mouth 2 (two) times daily.    Yes [provider]  irbesartan-hydrochlorothiazide (AVALIDE) 300-12.5 MG per tablet Take 1 tablet by mouth daily.   Yes [provider]  Multiple Vitamin (MULTIVITAMIN) capsule Take 1 capsule by mouth daily.   Yes [provider]  pantoprazole (PROTONIX) 40 MG tablet Take 40 mg by mouth daily.   Yes [provider]  simvastatin (ZOCOR) 20 MG tablet Take 20 mg by mouth daily.   Yes [provider]  dicyclomine (BENTYL) 10 MG capsule Take 1 capsule (10 mg total) by mouth 2 (two) times daily before a meal. Patient not taking: Reported on 01/22/2017 12/08/16   Butch Penny, NP  doxycycline (VIBRAMYCIN) 100 MG capsule Take 1 capsule (100 mg total) by mouth 2 (two) times daily. Patient not taking: Reported on 01/22/2017 10/27/16   Triplett, Tammy, PA-C  furosemide (LASIX) 20  MG tablet  02/28/16   [provider]  ibuprofen (ADVIL,MOTRIN) 800 MG tablet Take 800 mg by mouth every 8 (eight) hours as needed.    [provider]  lactobacillus acidophilus (BACID) TABS tablet Take 2 tablets by mouth 3 (three) times daily.    [provider]  metroNIDAZOLE (FLAGYL) 500 MG tablet Take 1 tablet (500 mg total) by mouth 2 (two) times daily. Patient not taking: Reported on 01/22/2017 12/08/16   Butch Penny, NP  minocycline (MINOCIN,DYNACIN) 50 MG capsule Take 50 mg by mouth as needed.    [provider]  potassium chloride (KLOR-CON) 20 MEQ packet Take by mouth 2 (two) times daily.    [provider]  predniSONE  (DELTASONE) 20 MG tablet Take 2 tablets (40 mg total) by mouth daily with breakfast. For the next four days 01/22/17   Carmin Muskrat, MD  vitamin B-12 (CYANOCOBALAMIN) 1000 MCG tablet Take 1,000 mcg by mouth daily.    [provider]    Family History Family History  Problem Relation Age of Onset  . Diabetes Father   . Stroke Father   . Heart disease Father   . Lung cancer Mother     Social History Social History  Substance Use Topics  . Smoking status: Current Every Day Smoker    Packs/day: 0.50    Years: 34.00    Types: Cigarettes  . Smokeless tobacco: Never Used  . Alcohol use No     Comment: (maybe one drink 3x a year)     Allergies   Bactrim and Sulfa antibiotics   Review of Systems Review of Systems  Constitutional:       Per HPI, otherwise negative  HENT:       Per HPI, otherwise negative  Respiratory:       Per HPI, otherwise negative  Cardiovascular:       Per HPI, otherwise negative  Gastrointestinal: Negative for vomiting.  Endocrine:       Negative aside from HPI  Genitourinary:       Neg aside from HPI   Musculoskeletal:       Per HPI, otherwise negative  Skin: Negative.   Neurological: Negative for syncope.     Physical Exam Updated Vital Signs BP (!) 150/76   Pulse 65   Temp 98.4 F (36.9 C) (Oral)   Resp 15   Ht 5\' 4"  (1.626 m)   Wt 89.8 kg (198 lb)   SpO2 95%   BMI 33.99 kg/m   Physical Exam  Constitutional: She is oriented to person, place, and time. She appears well-developed and well-nourished. No distress.  HENT:  Head: Normocephalic and atraumatic.  Eyes: Conjunctivae and EOM are normal.  Cardiovascular: Normal rate and regular rhythm.   Pulmonary/Chest: No stridor. She has decreased breath sounds.  Abdominal: She exhibits no distension.  Musculoskeletal: She exhibits no edema.  Neurological: She is alert and oriented to person, place, and time. No cranial nerve deficit.  Skin: Skin is warm and dry.    Psychiatric: She has a normal mood and affect.  Nursing note and vitals reviewed.    ED Treatments / Results  Labs (all labs ordered are listed, but only abnormal results are displayed) Labs Reviewed  COMPREHENSIVE METABOLIC PANEL - Abnormal; Notable for the following:       Result Value   Glucose, Bld 105 (*)    BUN 23 (*)    Creatinine, Ser 1.16 (*)    GFR calc non  Af Amer 52 (*)    GFR calc Af Amer 60 (*)    All other components within normal limits  CBC WITH DIFFERENTIAL/PLATELET - Abnormal; Notable for the following:    Hemoglobin 15.3 (*)    Monocytes Absolute 1.1 (*)    All other components within normal limits  TROPONIN I  BRAIN NATRIURETIC PEPTIDE  TROPONIN I    EKG  EKG Interpretation  Date/Time:  Thursday January 22 2017 12:44:34 EDT Ventricular Rate:  66 PR Interval:    QRS Duration: 90 QT Interval:  384 QTC Calculation: 403 R Axis:   72 Text Interpretation:  Sinus rhythm T wave abnormality Artifact Abnormal ekg Confirmed by Carmin Muskrat [phone]) on 01/22/2017 12:50:32 PM       Radiology Dg Chest 2 View  Result Date: 01/22/2017 CLINICAL DATA:  Short of breath EXAM: CHEST  2 VIEW COMPARISON:  10/26/2016 FINDINGS: COPD with pulmonary hyperinflation. Negative for infiltrate or effusion. Negative for mass or adenopathy. Heart size upper normal. IMPRESSION: COPD without acute abnormality. Electronically Signed   By: Franchot Gallo M.D.   On: 01/22/2017 13:28    Procedures Procedures (including critical care time)  Medications Ordered in ED Medications  albuterol (PROVENTIL HFA;VENTOLIN HFA) 108 (90 Base) MCG/ACT inhaler 2 puff (not administered)  sodium chloride 0.9 % bolus 1,000 mL (0 mLs Intravenous Stopped 01/22/17 1516)  dexamethasone (DECADRON) injection 10 mg (10 mg Intravenous Given 01/22/17 1401)     Initial Impression / Assessment and Plan / ED Course  I have reviewed the triage vital signs and the nursing notes.  Pertinent labs & imaging  results that were available during my care of the patient were reviewed by me and considered in my medical decision making (see chart for details).  5:22 PM Now, after the patient has had fluid resuscitation, had 2 negative troponins, she appears substantially better,with no ongoing pain, no ongoing dyspnea. After the initial evaluation had notable findings of elevated creatinine, the patient received her fluids, steroids. Patient denies formal diagnosis of COPD, but given her smoking history, appearance of the x-ray, and description of dyspnea, lightheadedness, there suspicion for COPD exacerbation. Given the substantial improvement here she is appropriate for discharge, understands the importance of following up with her physician next week, to ensure continued improvement and for additional evaluation of her pulmonary condition. With nonischemic ECG, 2 negative troponins, there is low suspicion for ongoing coronary ischemia as well. No evidence for pulmonary embolism or pneumonia.   Final Clinical Impressions(s) / ED Diagnoses   Final diagnoses:  Atypical chest pain    New Prescriptions New Prescriptions   PREDNISONE (DELTASONE) 20 MG TABLET    Take 2 tablets (40 mg total) by mouth daily with breakfast. For the next four days     Carmin Muskrat, MD 01/22/17 1723

## 2017-01-22 NOTE — ED Triage Notes (Signed)
Pt was push mowing grass and felt like she couldn't breath, family checked bp, it was in 42's sys. Then immediatly after was 120 sys.  Pt c/o of chest tightness.  Pt reports seeing dark spots during episode.

## 2017-01-22 NOTE — Discharge Instructions (Signed)
As discussed, your evaluation today has been largely reassuring.  But, it is important that you monitor your condition carefully, and do not hesitate to return to the ED if you develop new, or concerning changes in your condition.  For the next 2 days please use the provided albuterol inhaler every 4 hours.   Otherwise, please follow-up with your physician for appropriate ongoing care.  Please be sure to discuss concern for pulmonary disease

## 2017-03-19 ENCOUNTER — Institutional Professional Consult (permissible substitution): Payer: Medicare PPO | Admitting: Pulmonary Disease

## 2017-03-19 ENCOUNTER — Encounter: Payer: Self-pay | Admitting: Internal Medicine

## 2017-03-19 ENCOUNTER — Ambulatory Visit (INDEPENDENT_AMBULATORY_CARE_PROVIDER_SITE_OTHER): Payer: Medicare PPO | Admitting: Internal Medicine

## 2017-03-19 VITALS — BP 122/80 | HR 56 | Ht 64.0 in | Wt 207.4 lb

## 2017-03-19 DIAGNOSIS — R062 Wheezing: Secondary | ICD-10-CM

## 2017-03-19 DIAGNOSIS — Z825 Family history of asthma and other chronic lower respiratory diseases: Secondary | ICD-10-CM

## 2017-03-19 DIAGNOSIS — J668 Airway disease due to other specific organic dusts: Secondary | ICD-10-CM | POA: Diagnosis not present

## 2017-03-19 DIAGNOSIS — Z87891 Personal history of nicotine dependence: Secondary | ICD-10-CM | POA: Diagnosis not present

## 2017-03-19 LAB — NITRIC OXIDE: Nitric Oxide: 7

## 2017-03-19 MED ORDER — PREDNISONE 10 MG PO TABS: ORAL_TABLET | ORAL | 0 refills | Status: DC

## 2017-03-19 MED ORDER — FLUTICASONE FUROATE-VILANTEROL 100-25 MCG/INH IN AEPB: 1.0000 | INHALATION_SPRAY | Freq: Every day | RESPIRATORY_TRACT | 5 refills | Status: DC

## 2017-03-19 NOTE — Addendum Note (Signed)
Addended by: Lorretta Harp on: 03/19/2017 04:10 PM   Modules accepted: Orders

## 2017-03-19 NOTE — Progress Notes (Signed)
Subjective:    Patient ID: Jenna Hancock, female    DOB: 1960-09-14, 56 y.o.   MRN: [id number]  PCP Pomposini, Cherly Anderson, MD   HPI   IOV 03/19/2017  Chief Complaint  Patient presents with  . Advice Only    Abnormal cxr 01/22/17 and due to that, pt requested to come to our office for appt. C/o SOB on exertion with occ. dry cough and occ. chest tightness. Pt stated that in May and June she was cleaning her place out due to having rat infestation and became real sick then and hasn't felt the same since.   Jenna Hancock 1960-12-20 : 56 year old female whose mother Naydeline Morace that I take care of for COPD. Patient herself is a smoker greater than 30 pack per day. But at baseline she has never had any respiratory issues. [place] with her boyfriend who is here with her today Then approximately the first week of May 2018: She took it upon herself to enter and closed small room that are not being visited in 1 year. The room contained chicken feed. There was significant rat infestation. She used a leaf blower without a mass and for 3 hours guarded of the rats. During this time a lot of rat feces she inhale and was exposed to. One day after this she started having fever and wheezing and shortness of breath and feeling extremely rundown. Then on 10/26/2016 She went to the ER in May 2018 with fever and generalized body aches following tick bite 2. She did not tell the emergency room doctors about exposure to rat feces because of embarrassment.  During this time wheezing was noticed on the physical exam. She was discharged on doxycycline. It looks like she did not get prednisone. After this she reports she's been to other urgent care but did not get prednisone and steroids. Then in June 2018 she saw primary care for chronic diarrhea At this visit she reported she lost 22 pounds. C. difficile test was negative. Then in 02/01/2017 she went to the emergency department because of shortness of  breath after mowing grass on a hot humid day. In the emergency room she did not have wheezing. She was discharged after albuterol and dexamethasone taper. She tells me that till this day that she is much better but she still has wheezing and shortness of breath and feeling fatigued and is not fully better. She is extremely worried about her respiratory issues    Chest x-ray 02/01/2017: Reported a COPD without any abnormality but to me on my personal visualization is only mildly hyperinflated but otherwise clear  Blood lab work 01/22/2017: Troponin normal, BMP normal, liver function test normal and creatinine normal and white count normal  Walking desaturation test 185 feet 3 laps on room air with a full head probe: Resting heart rate 57/m. Peak heart rate 85/m. Resting pulse ox 100%. Final pulse ox 99% and she became short of breath. She did not desaturate.   Exhaled nitric oxide in the office 03/19/2017: 7 ppb  Spirometry 03/19/2017 -> fev1 1/9L/70%, Ratio 76 - office spirometry  Past medical history review shows that she's had a history of pneumonia, sinus infections and sinus nasal surgeries.     has a past medical history of Anxiety; Essential hypertension, benign (12/08/2016); GERD (gastroesophageal reflux disease); High cholesterol (12/08/2016); Hyperlipidemia; Hypertension; Sleep apnea; and TIA (transient ischemic attack) (2017).   reports that she has been smoking Cigarettes.  She has  a 34.00 pack-year smoking history. She has never used smokeless tobacco.  Past Surgical History:  Procedure Laterality Date  . APPENDECTOMY    . JOINT REPLACEMENT     rt knee 11 yrs ago  . NASAL SINUS SURGERY      Allergies  Allergen Reactions  . Bactrim Rash  . Sulfa Antibiotics Rash    Immunization History  Administered Date(s) Administered  . Influenza,inj,Quad PF,6+ Mos 04/16/2014  . Tdap 02/16/2012    Family History  Problem Relation Age of Onset  . Diabetes Father   . Stroke  Father   . Heart disease Father   . Lung cancer Mother      Current Outpatient Prescriptions:  .  albuterol (PROVENTIL HFA;VENTOLIN HFA) 108 (90 Base) MCG/ACT inhaler, Proventil HFA 90 mcg/actuation aerosol inhaler  Inhale 2 puffs 4 times a day by inhalation route as needed., Disp: , Rfl:  .  ALPRAZolam (XANAX) 0.5 MG tablet, Take 0.5 mg by mouth as needed for anxiety., Disp: , Rfl:  .  Ascorbic Acid (VITAMIN C) 1000 MG tablet, Take 1,000 mg by mouth daily., Disp: , Rfl:  .  aspirin 325 MG tablet, Take 325 mg by mouth daily., Disp: , Rfl:  .  fish oil-omega-3 fatty acids 1000 MG capsule, Take 2 g by mouth daily., Disp: , Rfl:  .  FLUoxetine (PROZAC) 20 MG capsule, Take 1 capsule by mouth daily., Disp: , Rfl:  .  HYDROcodone-acetaminophen (NORCO) 10-325 MG per tablet, Take 1 tablet by mouth 2 (two) times daily. , Disp: , Rfl:  .  ibuprofen (ADVIL,MOTRIN) 800 MG tablet, Take 800 mg by mouth every 8 (eight) hours as needed., Disp: , Rfl:  .  irbesartan-hydrochlorothiazide (AVALIDE) 300-12.5 MG per tablet, Take 1 tablet by mouth daily., Disp: , Rfl:  .  minocycline (MINOCIN,DYNACIN) 50 MG capsule, Take 50 mg by mouth as needed., Disp: , Rfl:  .  Multiple Vitamin (MULTIVITAMIN) capsule, Take 1 capsule by mouth daily., Disp: , Rfl:  .  pantoprazole (PROTONIX) 40 MG tablet, Take 40 mg by mouth daily., Disp: , Rfl:  .  simvastatin (ZOCOR) 20 MG tablet, Take 20 mg by mouth daily., Disp: , Rfl:     Review of Systems  Constitutional: Negative for fever and unexpected weight change.  HENT: Positive for ear pain, sinus pressure, sneezing and sore throat. Negative for congestion, dental problem, nosebleeds, postnasal drip, rhinorrhea and trouble swallowing.   Eyes: Negative for redness and itching.  Respiratory: Positive for cough, chest tightness, shortness of breath and wheezing.   Cardiovascular: Negative for palpitations and leg swelling.  Gastrointestinal: Negative for nausea and vomiting.    Genitourinary: Negative for dysuria.  Musculoskeletal: Negative for joint swelling.  Skin: Negative for rash.  Allergic/Immunologic: Negative.  Negative for environmental allergies, food allergies and immunocompromised state.  Neurological: Positive for headaches.  Hematological: Bruises/bleeds easily.  Psychiatric/Behavioral: Positive for dysphoric mood. The patient is nervous/anxious.        Objective:   Physical Exam  Constitutional: She is oriented to person, place, and time. She appears well-developed and well-nourished. No distress.  HENT:  Head: Normocephalic and atraumatic.  Right Ear: External ear normal.  Left Ear: External ear normal.  Mouth/Throat: Oropharynx is clear and moist. No oropharyngeal exudate.  Eyes: Pupils are equal, round, and reactive to light. Conjunctivae and EOM are normal. Right eye exhibits no discharge. Left eye exhibits no discharge. No scleral icterus.  Neck: Normal range of motion. Neck supple. No JVD present. No tracheal  deviation present. No thyromegaly present.  Cardiovascular: Normal rate, regular rhythm, normal heart sounds and intact distal pulses.  Exam reveals no gallop and no friction rub.   No murmur heard. Pulmonary/Chest: Effort normal. No respiratory distress. She has no wheezes. She has rales. She exhibits no tenderness.  Abdominal: Soft. Bowel sounds are normal. She exhibits no distension and no mass. There is no tenderness. There is no rebound and no guarding.  Musculoskeletal: Normal range of motion. She exhibits no edema or tenderness.  Lymphadenopathy:    She has no cervical adenopathy.  Neurological: She is alert and oriented to person, place, and time. She has normal reflexes. No cranial nerve deficit. She exhibits normal muscle tone. Coordination normal.  Skin: Skin is warm and dry. No rash noted. She is not diaphoretic. No erythema. No pallor.  Psychiatric: She has a normal mood and affect. Her behavior is normal. Judgment and  thought content normal.  Vitals reviewed.   Vitals:   03/19/17 1141  BP: 122/80  Pulse: (!) 56  SpO2: 98%  Weight: 207 lb 6.4 oz (94.1 kg)  Height: 5\' 4"  (1.626 m)    Estimated body mass index is 35.6 kg/m as calculated from the following:   Height as of this encounter: 5\' 4"  (1.626 m).   Weight as of this encounter: 207 lb 6.4 oz (94.1 kg).       Assessment & Plan:     ICD-10-CM   1. 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 History of smoking 25-50 pack years Z87.891   3. Family history of COPD (chronic obstructive pulmonary disease) Z82.5   4. Bilateral wheezing R06.2      I think you have RADS like disease  or COPD unmaksed by exposure Or both ? If she had acoute HP back then but cxr a week out in the ER in mid may 2018 was clear This has come about like a :hit and run following organic dust exposure You have active wheezing - suggesting active disease  pLAN - agree that working on quitting smoking is a great idea - congratulations - start BREO low dose daily - .Please take Take prednisone 40mg  once daily x 3 days, then 30mg  once daily x 3 days, then 20mg  once daily x 3 days, then prednisone 10mg  once daily  x 3 days and stop  Followup 6 weeks do full PFT Return to see me in 6 weeks to report progress   Dr. Brand Males, M.D., [facility].C.P Pulmonary and Critical Care Medicine Staff Physician [facility] Pager: [phone], If no answer or between  15:00h - 7:00h: call [phone]  03/19/2017 12:18 PM

## 2017-03-19 NOTE — Patient Instructions (Signed)
ICD-10-CM   1. 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 History of smoking 25-50 pack years Z87.891   3. Family history of COPD (chronic obstructive pulmonary disease) Z82.5   4. Bilateral wheezing R06.2     I think you have RADS like disease  or COPD unmaksed by exposure Or both This has come about like a :hit and run following organic dust exposure You have active wheezing - suggesting active disease  pLAN - agree that working on quitting smoking is a great idea - congratulations - do spirometry 03/19/2017 - eiher with June Leap or in office 03/19/2017 - start BREO low dose daily - .Please take Take prednisone 40mg  once daily x 3 days, then 30mg  once daily x 3 days, then 20mg  once daily x 3 days, then prednisone 10mg  once daily  x 3 days and stop  Followup 6 weeks do full PFT Return to see me in 6 weeks to report progress

## 2017-04-16 ENCOUNTER — Other Ambulatory Visit: Payer: Self-pay | Admitting: *Deleted

## 2017-04-16 ENCOUNTER — Ambulatory Visit (INDEPENDENT_AMBULATORY_CARE_PROVIDER_SITE_OTHER): Payer: Medicare PPO

## 2017-04-16 DIAGNOSIS — Z23 Encounter for immunization: Secondary | ICD-10-CM

## 2017-04-16 MED ORDER — FLUTICASONE FUROATE-VILANTEROL 100-25 MCG/INH IN AEPB: 1.0000 | INHALATION_SPRAY | Freq: Every day | RESPIRATORY_TRACT | 0 refills | Status: DC

## 2017-04-22 ENCOUNTER — Ambulatory Visit (INDEPENDENT_AMBULATORY_CARE_PROVIDER_SITE_OTHER): Payer: Medicare PPO | Admitting: Internal Medicine

## 2017-04-22 DIAGNOSIS — J668 Airway disease due to other specific organic dusts: Secondary | ICD-10-CM

## 2017-04-22 LAB — PULMONARY FUNCTION TEST
DL/VA % PRED: 88 %
DL/VA: 4.18 ml/min/mmHg/L
DLCO COR % PRED: 85 %
DLCO COR: 20.04 ml/min/mmHg
DLCO UNC % PRED: 85 %
DLCO unc: 20.04 ml/min/mmHg
FEF 25-75 POST: 1.75 L/s
FEF 25-75 PRE: 2.04 L/s
FEF2575-%CHANGE-POST: -14 %
FEF2575-%PRED-POST: 71 %
FEF2575-%Pred-Pre: 83 %
FEV1-%CHANGE-POST: 0 %
FEV1-%PRED-POST: 81 %
FEV1-%Pred-Pre: 82 %
FEV1-PRE: 2.13 L
FEV1-Post: 2.11 L
FEV1FVC-%Change-Post: 0 %
FEV1FVC-%PRED-PRE: 98 %
FEV6-%Change-Post: 2 %
FEV6-%PRED-POST: 84 %
FEV6-%Pred-Pre: 82 %
FEV6-Post: 2.72 L
FEV6-Pre: 2.66 L
FEV6FVC-%Pred-Post: 103 %
FEV6FVC-%Pred-Pre: 103 %
FVC-%Change-Post: 0 %
FVC-%Pred-Post: 81 %
FVC-%Pred-Pre: 81 %
FVC-PRE: 2.73 L
FVC-Post: 2.72 L
POST FEV1/FVC RATIO: 78 %
PRE FEV1/FVC RATIO: 78 %
PRE FEV6/FVC RATIO: 100 %
Post FEV6/FVC ratio: 100 %
RV % PRED: 121 %
RV: 2.31 L
TLC % pred: 103 %
TLC: 5.16 L

## 2017-04-22 NOTE — Progress Notes (Signed)
PFT done today. 

## 2017-04-30 ENCOUNTER — Ambulatory Visit: Payer: Medicare PPO | Admitting: Internal Medicine

## 2017-06-10 ENCOUNTER — Telehealth: Payer: Self-pay | Admitting: Internal Medicine

## 2017-06-10 NOTE — Telephone Encounter (Signed)
Spoke with patient. She stated that she has been coughing and having a fever at night for the past few days. She wanted an appt with MR but I advised her that he would not be back in the office for another 2 weeks. Was able to schedule her with MR for tomorrow at 1115.   She was also calling about the results of her PFT that she had done on 04/22/17.   MR, please advise on her PFT results. Thanks!

## 2017-06-11 ENCOUNTER — Encounter: Payer: Self-pay | Admitting: Internal Medicine

## 2017-06-11 ENCOUNTER — Ambulatory Visit (INDEPENDENT_AMBULATORY_CARE_PROVIDER_SITE_OTHER)
Admission: RE | Admit: 2017-06-11 | Discharge: 2017-06-11 | Disposition: A | Discharge status: Home / Self Care | Payer: Medicare PPO | Source: Ambulatory Visit | Attending: Internal Medicine | Admitting: Internal Medicine

## 2017-06-11 ENCOUNTER — Ambulatory Visit (INDEPENDENT_AMBULATORY_CARE_PROVIDER_SITE_OTHER): Payer: Medicare PPO | Admitting: Internal Medicine

## 2017-06-11 VITALS — HR 66 | Temp 98.3°F | Ht 63.0 in | Wt 212.6 lb

## 2017-06-11 DIAGNOSIS — J4531 Mild persistent asthma with (acute) exacerbation: Secondary | ICD-10-CM

## 2017-06-11 DIAGNOSIS — F17210 Nicotine dependence, cigarettes, uncomplicated: Secondary | ICD-10-CM

## 2017-06-11 DIAGNOSIS — J45901 Unspecified asthma with (acute) exacerbation: Secondary | ICD-10-CM | POA: Insufficient documentation

## 2017-06-11 MED ORDER — PREDNISONE 10 MG PO TABS: ORAL_TABLET | ORAL | 0 refills | Status: DC

## 2017-06-11 MED ORDER — PANTOPRAZOLE SODIUM 40 MG PO TBEC: DELAYED_RELEASE_TABLET | ORAL | Status: DC

## 2017-06-11 MED ORDER — BUDESONIDE-FORMOTEROL FUMARATE 80-4.5 MCG/ACT IN AERO: INHALATION_SPRAY | RESPIRATORY_TRACT | 11 refills | Status: DC

## 2017-06-11 MED ORDER — ACETAMINOPHEN-CODEINE #3 300-30 MG PO TABS: ORAL_TABLET | ORAL | 0 refills | Status: DC

## 2017-06-11 MED ORDER — AZITHROMYCIN 250 MG PO TABS: ORAL_TABLET | ORAL | 0 refills | Status: DC

## 2017-06-11 NOTE — Patient Instructions (Addendum)
The key is to stop smoking completely before smoking completely stops you!   For cough > mucinex dm up to 1200 mg every 12 hours and supplment with tylenol #3 one every 4 hours if needed   Plan A = Automatic = symbicort 80 Take 2 puffs first thing in am and then another 2 puffs about 12 hours later and stop breo and fish oil   Work on inhaler technique:  relax and gently blow all the way out then take a nice smooth deep breath back in, triggering the inhaler at same time you start breathing in.  Hold for up to 5 seconds if you can. Blow out thru nose. Rinse and gargle with water when done      Plan B = Backup Only use your albuterol as a rescue medication to be used if you can't catch your breath by resting or doing a relaxed purse lip breathing pattern.  - The less you use it, the better it will work when you need it. - Ok to use the inhaler up to 2 puffs  every 4 hours if you must but call for appointment if use goes up over your usual need - Don't leave home without it !!  (think of it like the spare tire for your car)    When coughing flares > increase  protonix  Take 30- 60 min before your first and last meals of the day until 100% better then resume the one tablet daily before first meal    GERD (REFLUX)  is an extremely common cause of respiratory symptoms just like yours , many times with no obvious heartburn at all.    It can be treated with medication, but also with lifestyle changes including elevation of the head of your bed (ideally with 6 inch  bed blocks),  Smoking cessation, avoidance of late meals, excessive alcohol, and avoid fatty foods, chocolate, peppermint, colas, red wine, and acidic juices such as orange juice.  NO MINT OR MENTHOL PRODUCTS SO NO COUGH DROPS   USE SUGARLESS CANDY INSTEAD (Jolley ranchers or Stover's or Life Savers) or even ice chips will also do - the key is to swallow to prevent all throat clearing. NO OIL BASED VITAMINS - use powdered  substitutes.   zpak  Prednisone 10 mg take  4 each am x 2 days,   2 each am x 2 days,  1 each am x 2 days and stop    Please remember to go to the  x-ray department downstairs in the basement  for your tests - we will call you with the results when they are available.

## 2017-06-11 NOTE — Progress Notes (Signed)
Subjective:    Patient ID: Jenna Hancock, female    DOB: 01/24/1961, 56 y.o.   MRN: [id number]  PCP Pomposini, Cherly Anderson, MD   Brief patient profile:  34 yowf active smoker with nl baseline pfts 04/22/17 and dx of AB      06/11/2017 acute extended ov/Curlie Macken re:  Chief Complaint  Patient presents with  . Acute Visit    Pt c/o increased SOB, wheezing, chest tightness and prod cough with minimal green sputum- onset was 6 days ago. She had fever 1 day ago.   cough off and on since May 2018 while on breo and using lots of saba / assoc chest tight better p saba  Shoemaker eval 2 week prior to [place]  Did not rec abx  Acute worse cough with purulent sputum esp in am since 06/05/17 assoc with nasal congestion/ sore throat/    sob with watery nasal d/c  rx pred / omnicef 12/21/- 12/26 and no better    No obvious day to day or daytime variability or assoc    mucus plugs or hemoptysis or cp or  or overt   hb symptoms. No unusual exposure hx or h/o childhood pna/ asthma or knowledge of premature birth.  Sleeping ok flat without nocturnal    exacerbation  of respiratory  c/o's or need for noct saba. Also denies any obvious fluctuation of symptoms with weather or environmental changes or other aggravating or alleviating factors except as outlined above   Current Allergies, Complete Past Medical History, Past Surgical History, Family History, and Social History were reviewed in [employer] record.  ROS  The following are not active complaints unless bolded Hoarseness, sore throat, dysphagia, dental problems, itching, sneezing,  nasal congestion or discharge of excess mucus or purulent secretions, ear ache,   fever, chills, sweats, unintended wt loss or wt gain, classically pleuritic or exertional cp,  orthopnea pnd or leg swelling, presyncope, palpitations, abdominal pain, anorexia, nausea, vomiting, diarrhea  or change in bowel habits or change in bladder habits, change in stools  or change in urine, dysuria, hematuria,  rash, arthralgias, visual complaints, headache, numbness, weakness or ataxia or problems with walking or coordination,  change in mood/affect or memory.        Current Meds  Medication Sig  . albuterol (PROVENTIL HFA;VENTOLIN HFA) 108 (90 Base) MCG/ACT inhaler Proventil HFA 90 mcg/actuation aerosol inhaler  Inhale 2 puffs 4 times a day by inhalation route as needed.  . ALPRAZolam (XANAX) 0.5 MG tablet Take 0.5 mg by mouth as needed for anxiety.  . Ascorbic Acid (VITAMIN C) 1000 MG tablet Take 1,000 mg by mouth daily.  Marland Kitchen aspirin 325 MG tablet Take 325 mg by mouth daily.  Marland Kitchen FLUoxetine (PROZAC) 20 MG capsule Take 1 capsule by mouth daily.  Marland Kitchen HYDROcodone-acetaminophen (NORCO) 10-325 MG per tablet Take 1 tablet by mouth 2 (two) times daily.   Marland Kitchen ibuprofen (ADVIL,MOTRIN) 800 MG tablet Take 800 mg by mouth every 8 (eight) hours as needed.  . irbesartan-hydrochlorothiazide (AVALIDE) 300-12.5 MG per tablet Take 1 tablet by mouth daily.  . Multiple Vitamin (MULTIVITAMIN) capsule Take 1 capsule by mouth daily.  . pantoprazole (PROTONIX) 40 MG tablet Take 30- 60 min before your first and last meals of the day  . simvastatin (ZOCOR) 20 MG tablet Take 20 mg by mouth daily.  . [DISCONTINUED] cefdinir (OMNICEF) 300 MG capsule Take 1 capsule by mouth 2 (two) times daily.  . [DISCONTINUED] fish oil-omega-3 fatty  acids 1000 MG capsule Take 2 g by mouth daily.  . [DISCONTINUED] fluticasone furoate-vilanterol (BREO ELLIPTA) 100-25 MCG/INH AEPB Inhale 1 puff into the lungs daily.  . [DISCONTINUED] pantoprazole (PROTONIX) 40 MG tablet Take 40 mg by mouth daily.               Objective:   Physical Exam    amb obese hoarse wf nad  Wt Readings from Last 3 Encounters:  06/11/17 212 lb 9.6 oz (96.4 kg)  03/19/17 207 lb 6.4 oz (94.1 kg)  01/22/17 198 lb (89.8 kg)     Vital signs reviewed - Note on arrival 02 sats  96% on RA     Cough / mid exp wheeze   HEENT: nl  dentition, turbinates bilaterally, and oropharynx. Nl external ear canals without cough reflex   NECK :  without JVD/Nodes/TM/ nl carotid upstrokes bilaterally   LUNGS: no acc muscle use,  Nl contour chest  With mid exp wheeze bilaterally assoc with coughing on exp    CV:  RRR  no s3 or murmur or increase in P2, and no edema   ABD:  soft and nontender with nl inspiratory excursion in the supine position. No bruits or organomegaly appreciated, bowel sounds nl  MS:  Nl gait/ ext warm without deformities, calf tenderness, cyanosis or clubbing No obvious joint restrictions   SKIN: warm and dry without lesions    NEURO:  alert, approp, nl sensorium with  no motor or cerebellar deficits apparent.      CXR PA and Lateral:   06/11/2017 :    I personally reviewed images and agree with radiology impression as follows:   COPD. Bronchitic changes, consistent with airways inflammation. No consolidation.     Assessment & Plan:

## 2017-06-11 NOTE — Assessment & Plan Note (Addendum)
06/11/2017  After extensive coaching inhaler device  effectiveness =    75% try stop breo and start sym 80 2bid   Refractory Symptoms since May 2018 >>>  DDX of  difficult airways management almost all start with A and  include Adherence, Ace Inhibitors, Acid Reflux, Active Sinus Disease, Alpha 1 Antitripsin deficiency, Anxiety masquerading as Airways dz,  ABPA,  Allergy(esp in young), Aspiration (esp in elderly), Adverse effects of meds,  Active smokers, A bunch of PE's (a small clot burden can't cause this syndrome unless there is already severe underlying pulm or vascular dz with poor reserve) plus two Bs  = Bronchiectasis and Beta blocker use..and one C= CHF    Adherence is always the initial "prime suspect" and is a multilayered concern that requires a "trust but verify" approach in every patient - starting with knowing how to use medications, especially inhalers, correctly, keeping up with refills and understanding the fundamental difference between maintenance and prns vs those medications only taken for a very short course and then stopped and not refilled.  - see hfa teaching  - rec return with all meds in hand using a trust but verify approach to confirm accurate Medication  Reconciliation The principal here is that until we are certain that the  patients are doing what we've asked, it makes no sense to ask them to do more.   Active smoking at top of the usual list of suspects   ? Acid (or non-acid) GERD > always difficult to exclude as up to 75% of pts in some series report no assoc GI/ Heartburn symptoms> rec max (24h)  acid suppression and diet restrictions/ reviewed and instructions given in writing.   ? Adverse effect of breo > symbicort sample   ? Allergy > Prednisone 10 mg take  4 each am x 2 days,   2 each am x 2 days,  1 each am x 2 days and stop  ? Active sinus dz > doubt so rx zpak and then consider sinus CT if not better    I had an extended discussion with the patient  reviewing all relevant studies completed to date and  lasting 25 minutes of a 40  minute acute visit with pt new to me     re  severe non-specific but potentially very serious refractory respiratory symptoms of uncertain and potentially multiple  etiologies.   Each maintenance medication was reviewed in detail including most importantly the difference between maintenance and prns and under what circumstances the prns are to be triggered using an action plan format that is not reflected in the computer generated alphabetically organized AVS.    Please see AVS for specific instructions unique to this office visit that I personally wrote and verbalized to the the pt in detail and then reviewed with pt  by my nurse highlighting any changes in therapy/plan of care  recommended at today's visit.

## 2017-06-12 ENCOUNTER — Telehealth: Payer: Self-pay | Admitting: Internal Medicine

## 2017-06-12 NOTE — Progress Notes (Signed)
See 06/10/18 PN

## 2017-06-12 NOTE — Telephone Encounter (Signed)
Called spoke with patient Advised of PFT results as stated by MR Pt also asked for her 12.27.18 cxr results from acute visit with MW  Nothing further needed; will sign off

## 2017-06-12 NOTE — Telephone Encounter (Signed)
Notes recorded by Tanda Rockers, MD on 06/12/2017 at 4:55 AM EST Call pt: Reviewed cxr and no acute change so no change in recommendations made at [facility] and spoke with pt and she is aware of results per MW>  Nothing further is needed.

## 2017-06-12 NOTE — Telephone Encounter (Signed)
Pft 04/22/17 -0 is normal

## 2017-06-13 DIAGNOSIS — F17210 Nicotine dependence, cigarettes, uncomplicated: Secondary | ICD-10-CM | POA: Insufficient documentation

## 2017-06-13 NOTE — Assessment & Plan Note (Signed)

## 2017-06-25 ENCOUNTER — Ambulatory Visit: Payer: Medicare HMO | Admitting: Internal Medicine

## 2017-06-25 ENCOUNTER — Encounter: Payer: Self-pay | Admitting: Internal Medicine

## 2017-06-25 ENCOUNTER — Other Ambulatory Visit (INDEPENDENT_AMBULATORY_CARE_PROVIDER_SITE_OTHER): Payer: Medicare HMO

## 2017-06-25 VITALS — BP 124/76 | HR 66 | Ht 63.0 in | Wt 213.8 lb

## 2017-06-25 DIAGNOSIS — R5382 Chronic fatigue, unspecified: Secondary | ICD-10-CM

## 2017-06-25 DIAGNOSIS — R05 Cough: Secondary | ICD-10-CM

## 2017-06-25 DIAGNOSIS — Z87891 Personal history of nicotine dependence: Secondary | ICD-10-CM

## 2017-06-25 DIAGNOSIS — J668 Airway disease due to other specific organic dusts: Secondary | ICD-10-CM | POA: Diagnosis not present

## 2017-06-25 DIAGNOSIS — Z825 Family history of asthma and other chronic lower respiratory diseases: Secondary | ICD-10-CM

## 2017-06-25 DIAGNOSIS — R0600 Dyspnea, unspecified: Secondary | ICD-10-CM | POA: Diagnosis not present

## 2017-06-25 DIAGNOSIS — R059 Cough, unspecified: Secondary | ICD-10-CM

## 2017-06-25 LAB — NITRIC OXIDE: NITRIC OXIDE: 12

## 2017-06-25 LAB — SEDIMENTATION RATE: SED RATE: 53 mm/h — AB (ref 0–30)

## 2017-06-25 NOTE — Addendum Note (Signed)
Addended by: Lorretta Harp on: 06/25/2017 09:41 AM   Modules accepted: Orders

## 2017-06-25 NOTE — Patient Instructions (Addendum)
ICD-10-CM   1. 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 History of smoking 25-50 pack years Z87.891   3. Family history of COPD (chronic obstructive pulmonary disease) Z82.5   4. Chronic fatigue R53.82   5. Dyspnea, unspecified type R06.00    DO blood work for Hypersensitivity pneumnitis panel, ANA, RF, CCP, SSA/SSB, SCL-70, ESR Do HRCT supine and prone Do alpha 1 genetic test for copd Continue symbicort as before  Followup Will call with results Return in 3 months or sooner if needed

## 2017-06-25 NOTE — Progress Notes (Signed)
Subjective:     Patient ID: Jenna Hancock, female   DOB: 1960/07/03, 57 y.o.   MRN: [id number]  HPI PCP Pomposini, Cherly Anderson, MD   HPI   IOV 03/19/2017  Chief Complaint  Patient presents with  . Advice Only    Abnormal cxr 01/22/17 and due to that, pt requested to come to our office for appt. C/o SOB on exertion with occ. dry cough and occ. chest tightness. Pt stated that in May and June she was cleaning her place out due to having rat infestation and became real sick then and hasn't felt the same since.   Jenna Hancock 12-18-1960 : 57 year old female whose mother Agusta Hackenberg that I take care of for COPD. Patient herself is a smoker greater than 30 pack per day. But at baseline she has never had any respiratory issues. [place] with her boyfriend who is here with her today Then approximately the first week of May 2018: She took it upon herself to enter and closed small room that are not being visited in 1 year. The room contained chicken feed. There was significant rat infestation. She used a leaf blower without a mass and for 3 hours guarded of the rats. During this time a lot of rat feces she inhale and was exposed to. One day after this she started having fever and wheezing and shortness of breath and feeling extremely rundown. Then on 10/26/2016 She went to the ER in May 2018 with fever and generalized body aches following tick bite 2. She did not tell the emergency room doctors about exposure to rat feces because of embarrassment.  During this time wheezing was noticed on the physical exam. She was discharged on doxycycline. It looks like she did not get prednisone. After this she reports she's been to other urgent care but did not get prednisone and steroids. Then in June 2018 she saw primary care for chronic diarrhea At this visit she reported she lost 22 pounds. C. difficile test was negative. Then in 02/01/2017 she went to the emergency department because of shortness of  breath after mowing grass on a hot humid day. In the emergency room she did not have wheezing. She was discharged after albuterol and dexamethasone taper. She tells me that till this day that she is much better but she still has wheezing and shortness of breath and feeling fatigued and is not fully better. She is extremely worried about her respiratory issues    Chest x-ray 02/01/2017: Reported a COPD without any abnormality but to me on my personal visualization is only mildly hyperinflated but otherwise clear  Blood lab work 01/22/2017: Troponin normal, BMP normal, liver function test normal and creatinine normal and white count normal  Walking desaturation test 185 feet 3 laps on room air with a full head probe: Resting heart rate 57/m. Peak heart rate 85/m. Resting pulse ox 100%. Final pulse ox 99% and she became short of breath. She did not desaturate.   Exhaled nitric oxide in the office 03/19/2017: 7 ppb  Spirometry 03/19/2017 -> fev1 1/9L/70%, Ratio 76 - office spirometry  Past medical history review shows that she's had a history of pneumonia, sinus infections and sinus nasal surgeries.      06/11/2017 acute extended ov/[place] re:  Chief Complaint  Patient presents with  . Acute Visit    Pt c/o increased SOB, wheezing, chest tightness and prod cough with minimal green sputum- onset was 6 days ago. She  had fever 1 day ago.   cough off and on since May 2018 while on breo and using lots of saba / assoc chest tight better p saba  Shoemaker eval 2 week prior to [employer]  Did not rec abx  Acute worse cough with purulent sputum esp in am since 06/05/17 assoc with nasal congestion/ sore throat/    sob with watery nasal d/c  rx pred / omnicef 12/21/- 12/26 and no better    No obvious day to day or daytime variability or assoc    mucus plugs or hemoptysis or cp or  or overt   hb symptoms. No unusual exposure hx or h/o childhood pna/ asthma or knowledge of premature birth.  Sleeping ok flat  without nocturnal    exacerbation  of respiratory  c/o's or need for noct saba. Also denies any obvious fluctuation of symptoms with weather or environmental changes or other aggravating or alleviating factors except as outlined above    OV 06/25/2017  Chief Complaint  Patient presents with  . Follow-up    Pt was seen by [facility] 06/11/17 and was dx with bronchitis. Pt went to ENT, Dr. Wilburn Cornelia and is currently on an abx. Pt has complaints of a cough with yellow mucus. Denies any SOB or CP   Jenna Hancock returns for follow-up.  She has respiratory symptoms following rat  antigen exposure in May 2018.  She continues to smoke and she continues to have respiratory symptoms.  Around Christmas 2018 she started feeling sick.  June 11, 2017 she saw my colleague Dr. Christinia Gully who advised her to stop smoking given antibiotic and prednisone.  Did a chest x-ray that I personally visualized that shows bronchitic changes but also my opinion that might be interstitial lung disease changes.  She started feeling better with and followed up with ENT doctor Dr. Wilburn Cornelia who has her on prolonged antibiotic therapy right now.  Overall she is better but she feels stable.  Nevertheless she feels she is a new new low baseline.  She feels fatigued all the time.  She is frustrated with  symptomatology but she continues to smoke.   PFT 04/22/17 - normal FENO 12 ppb 06/25/2017     has a past medical history of Anxiety, Essential hypertension, benign (12/08/2016), GERD (gastroesophageal reflux disease), High cholesterol (12/08/2016), Hyperlipidemia, Hypertension, Sleep apnea, and TIA (transient ischemic attack) (2017).   reports that she has been smoking cigarettes.  She has a 34.00 pack-year smoking history. she has never used smokeless tobacco.  Past Surgical History:  Procedure Laterality Date  . APPENDECTOMY    . JOINT REPLACEMENT     rt knee 11 yrs ago  . NASAL SINUS SURGERY      Allergies  Allergen Reactions   . Bactrim Rash  . Sulfa Antibiotics Rash    Immunization History  Administered Date(s) Administered  . Influenza,inj,Quad PF,6+ Mos 04/16/2014, 04/16/2017  . Influenza,inj,quad, With Preservative 04/20/2017  . Influenza-Unspecified 04/25/2015  . Tdap 02/16/2012    Family History  Problem Relation Age of Onset  . Diabetes Father   . Stroke Father   . Heart disease Father   . Lung cancer Mother      Current Outpatient Medications:  .  albuterol (PROVENTIL HFA;VENTOLIN HFA) 108 (90 Base) MCG/ACT inhaler, Proventil HFA 90 mcg/actuation aerosol inhaler  Inhale 2 puffs 4 times a day by inhalation route as needed., Disp: , Rfl:  .  ALPRAZolam (XANAX) 0.5 MG tablet, Take 0.5 mg by mouth as  needed for anxiety., Disp: , Rfl:  .  Ascorbic Acid (VITAMIN C) 1000 MG tablet, Take 1,000 mg by mouth daily., Disp: , Rfl:  .  aspirin 325 MG tablet, Take 325 mg by mouth daily., Disp: , Rfl:  .  budesonide-formoterol (SYMBICORT) 80-4.5 MCG/ACT inhaler, Take 2 puffs first thing in am and then another 2 puffs about 12 hours later., Disp: 1 Inhaler, Rfl: 11 .  FLUoxetine (PROZAC) 20 MG capsule, Take 1 capsule by mouth daily., Disp: , Rfl:  .  HYDROcodone-acetaminophen (NORCO) 10-325 MG per tablet, Take 1 tablet by mouth 2 (two) times daily. , Disp: , Rfl:  .  irbesartan-hydrochlorothiazide (AVALIDE) 300-12.5 MG per tablet, Take 1 tablet by mouth daily., Disp: , Rfl:  .  levofloxacin (LEVAQUIN) 500 MG tablet, Take by mouth., Disp: , Rfl:  .  Multiple Vitamin (MULTIVITAMIN) capsule, Take 1 capsule by mouth daily., Disp: , Rfl:  .  pantoprazole (PROTONIX) 40 MG tablet, Take 30- 60 min before your first and last meals of the day, Disp: , Rfl:  .  simvastatin (ZOCOR) 20 MG tablet, Take 20 mg by mouth daily., Disp: , Rfl:    Review of Systems     Objective:   Physical Exam  Constitutional: She is oriented to person, place, and time. She appears well-developed and well-nourished. No distress.  obese   HENT:  Head: Normocephalic and atraumatic.  Right Ear: External ear normal.  Left Ear: External ear normal.  Mouth/Throat: Oropharynx is clear and moist. No oropharyngeal exudate.  Tobacco smell  Eyes: Conjunctivae and EOM are normal. Pupils are equal, round, and reactive to light. Right eye exhibits no discharge. Left eye exhibits no discharge. No scleral icterus.  Neck: Normal range of motion. Neck supple. No JVD present. No tracheal deviation present. No thyromegaly present.  Cardiovascular: Normal rate, regular rhythm, normal heart sounds and intact distal pulses. Exam reveals no gallop and no friction rub.  No murmur heard. Pulmonary/Chest: Effort normal and breath sounds normal. No respiratory distress. She has no wheezes. She has no rales. She exhibits no tenderness.  Abdominal: Soft. Bowel sounds are normal. She exhibits no distension and no mass. There is no tenderness. There is no rebound and no guarding.  Musculoskeletal: Normal range of motion. She exhibits no edema or tenderness.  Lymphadenopathy:    She has no cervical adenopathy.  Neurological: She is alert and oriented to person, place, and time. She has normal reflexes. No cranial nerve deficit. She exhibits normal muscle tone. Coordination normal.  Skin: Skin is warm and dry. No rash noted. She is not diaphoretic. No erythema. No pallor.  Psychiatric: Her behavior is normal. Judgment and thought content normal.  Flat affect  Vitals reviewed.  Vitals:   06/25/17 0905  BP: 124/76  Pulse: 66  SpO2: 97%  Weight: 213 lb 12.8 oz (97 kg)  Height: 5' 3"  (1.6 m)    Estimated body mass index is 37.87 kg/m as calculated from the following:   Height as of this encounter: 5' 3"  (1.6 m).   Weight as of this encounter: 213 lb 12.8 oz (97 kg).     Assessment:       ICD-10-CM   1. 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 History of smoking 25-50 pack years Z87.891   3. Family history of COPD  (chronic obstructive pulmonary disease) Z82.5   4. Chronic fatigue R53.82   5. Dyspnea, unspecified type R06.00  Plan:      DO blood work for Hypersensitivity pneumnitis panel, ANA, RF, CCP, SSA/SSB, SCL-70, ESR Do HRCT supine and prone Do alpha 1 genetic test for copd Continue symbicort as before  Followup Will call with results Return in 3 months or sooner if needed   > 50% of this > 25 min visit spent in face to face counseling or coordination of care    Dr. Brand Males, M.D., [facility].C.P Pulmonary and Critical Care Medicine Staff Physician, [place] Director - Interstitial Lung Disease  Program  Pulmonary [place] at [place], [place], [address]  Pager: [phone], If no answer or between  15:00h - 7:00h: call [phone] Telephone: [phone]

## 2017-07-03 LAB — ANA: Anti Nuclear Antibody(ANA): NEGATIVE

## 2017-07-03 LAB — HYPERSENSITIVITY PNUEMONITIS PROFILE
ASPERGILLUS FUMIGATUS: NEGATIVE
Faenia retivirgula: NEGATIVE
PIGEON SERUM: NEGATIVE
S. VIRIDIS: NEGATIVE
T. CANDIDUS: NEGATIVE
T. VULGARIS: NEGATIVE

## 2017-07-03 LAB — CYCLIC CITRUL PEPTIDE ANTIBODY, IGG: Cyclic Citrullin Peptide Ab: <16 UNITS

## 2017-07-03 LAB — RHEUMATOID FACTOR: Rhuematoid fact SerPl-aCnc: <14 IU/mL (ref <14)

## 2017-07-03 LAB — ALPHA-1 ANTITRYPSIN PHENOTYPE: A-1 Antitrypsin, Ser: 173 mg/dL (ref 83–199)

## 2017-07-03 LAB — ANTI-SCLERODERMA ANTIBODY: SCLERODERMA (SCL-70) (ENA) ANTIBODY, IGG: NEGATIVE AI

## 2017-07-07 ENCOUNTER — Ambulatory Visit (HOSPITAL_COMMUNITY)
Admission: RE | Admit: 2017-07-07 | Discharge: 2017-07-07 | Disposition: A | Discharge status: Home / Self Care | Payer: Medicare HMO | Source: Ambulatory Visit | Attending: Internal Medicine | Admitting: Internal Medicine

## 2017-07-07 DIAGNOSIS — K8020 Calculus of gallbladder without cholecystitis without obstruction: Secondary | ICD-10-CM | POA: Diagnosis not present

## 2017-07-07 DIAGNOSIS — J219 Acute bronchiolitis, unspecified: Secondary | ICD-10-CM | POA: Insufficient documentation

## 2017-07-07 DIAGNOSIS — I700 Atherosclerosis of aorta: Secondary | ICD-10-CM | POA: Diagnosis not present

## 2017-07-07 DIAGNOSIS — I2510 Atherosclerotic heart disease of native coronary artery without angina pectoris: Secondary | ICD-10-CM | POA: Insufficient documentation

## 2017-07-07 DIAGNOSIS — J849 Interstitial pulmonary disease, unspecified: Secondary | ICD-10-CM | POA: Insufficient documentation

## 2017-07-07 DIAGNOSIS — J668 Airway disease due to other specific organic dusts: Secondary | ICD-10-CM | POA: Diagnosis present

## 2017-07-14 ENCOUNTER — Telehealth: Payer: Self-pay | Admitting: Internal Medicine

## 2017-07-14 NOTE — Telephone Encounter (Signed)
Let Jenna Hancock klnpw on CT  A.  does have coronary artery calcification and if no normal cardiac stress test past few years; please refer to cardiologist - [facility] or Dr Einar Gip, first available  B. Asymptomatic gall stones - she should talk top Pomposini, Cherly Anderson, MD  C. Findings in lung about smoker's lung disease - only way out is to quit smoking   Dr. Brand Males, M.D., [facility].C.P Pulmonary and Critical Care Medicine Staff Physician, [place] Director - Interstitial Lung Disease  Program  Pulmonary [place] at [place], [place], [address]  Pager: [phone], If no answer or between  15:00h - 7:00h: call [phone] Telephone: [phone]      IMPRESSION: 1. Minimal patchy ground-glass centrilobular micronodularity in the upper lobes. 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 2. Mild to moderate patchy air trapping in both lungs, indicative of small airways disease. 3. One vessel coronary atherosclerosis. 4. Cholelithiasis.  Aortic Atherosclerosis (ICD10-I70.0).   Electronically Signed   By: Ilona Sorrel M.D.   On: 07/07/2017 17:08IMPRESSION: 1. Minimal patchy ground-glass centrilobular micronodularity in the upper lobes. 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 2. Mild to moderate patchy air trapping in both lungs, indicative of small airways disease. 3. One vessel coronary atherosclerosis. 4. Cholelithiasis.  Aortic Atherosclerosis (ICD10-I70.0).   Electronically Signed   By: Ilona Sorrel M.D.   On: 07/07/2017 17:08

## 2017-07-15 NOTE — Telephone Encounter (Signed)
Talked with patient, Jenna Hancock, about results per Dr. Chase Caller. Patient reported that she has a cardiologist in [place], [place] whom she saw over the summer months and had a stress test. Patient stated she will follow up with Dr. Harmon Pier about gall stones but that she has been doing a low fat diet/diabetic diet. Patient was told that it is recommended that she quit smoking. Patient stated she will try to stop smoking. Patient has no further questions at this time.

## 2017-11-11 IMAGING — DX DG CHEST 2V
2 series · 2 of 2 positions shown · non-contrast
Comparison: 10/26/2016

CLINICAL DATA: Short of breath

EXAM:
CHEST  2 VIEW

[chest pa]
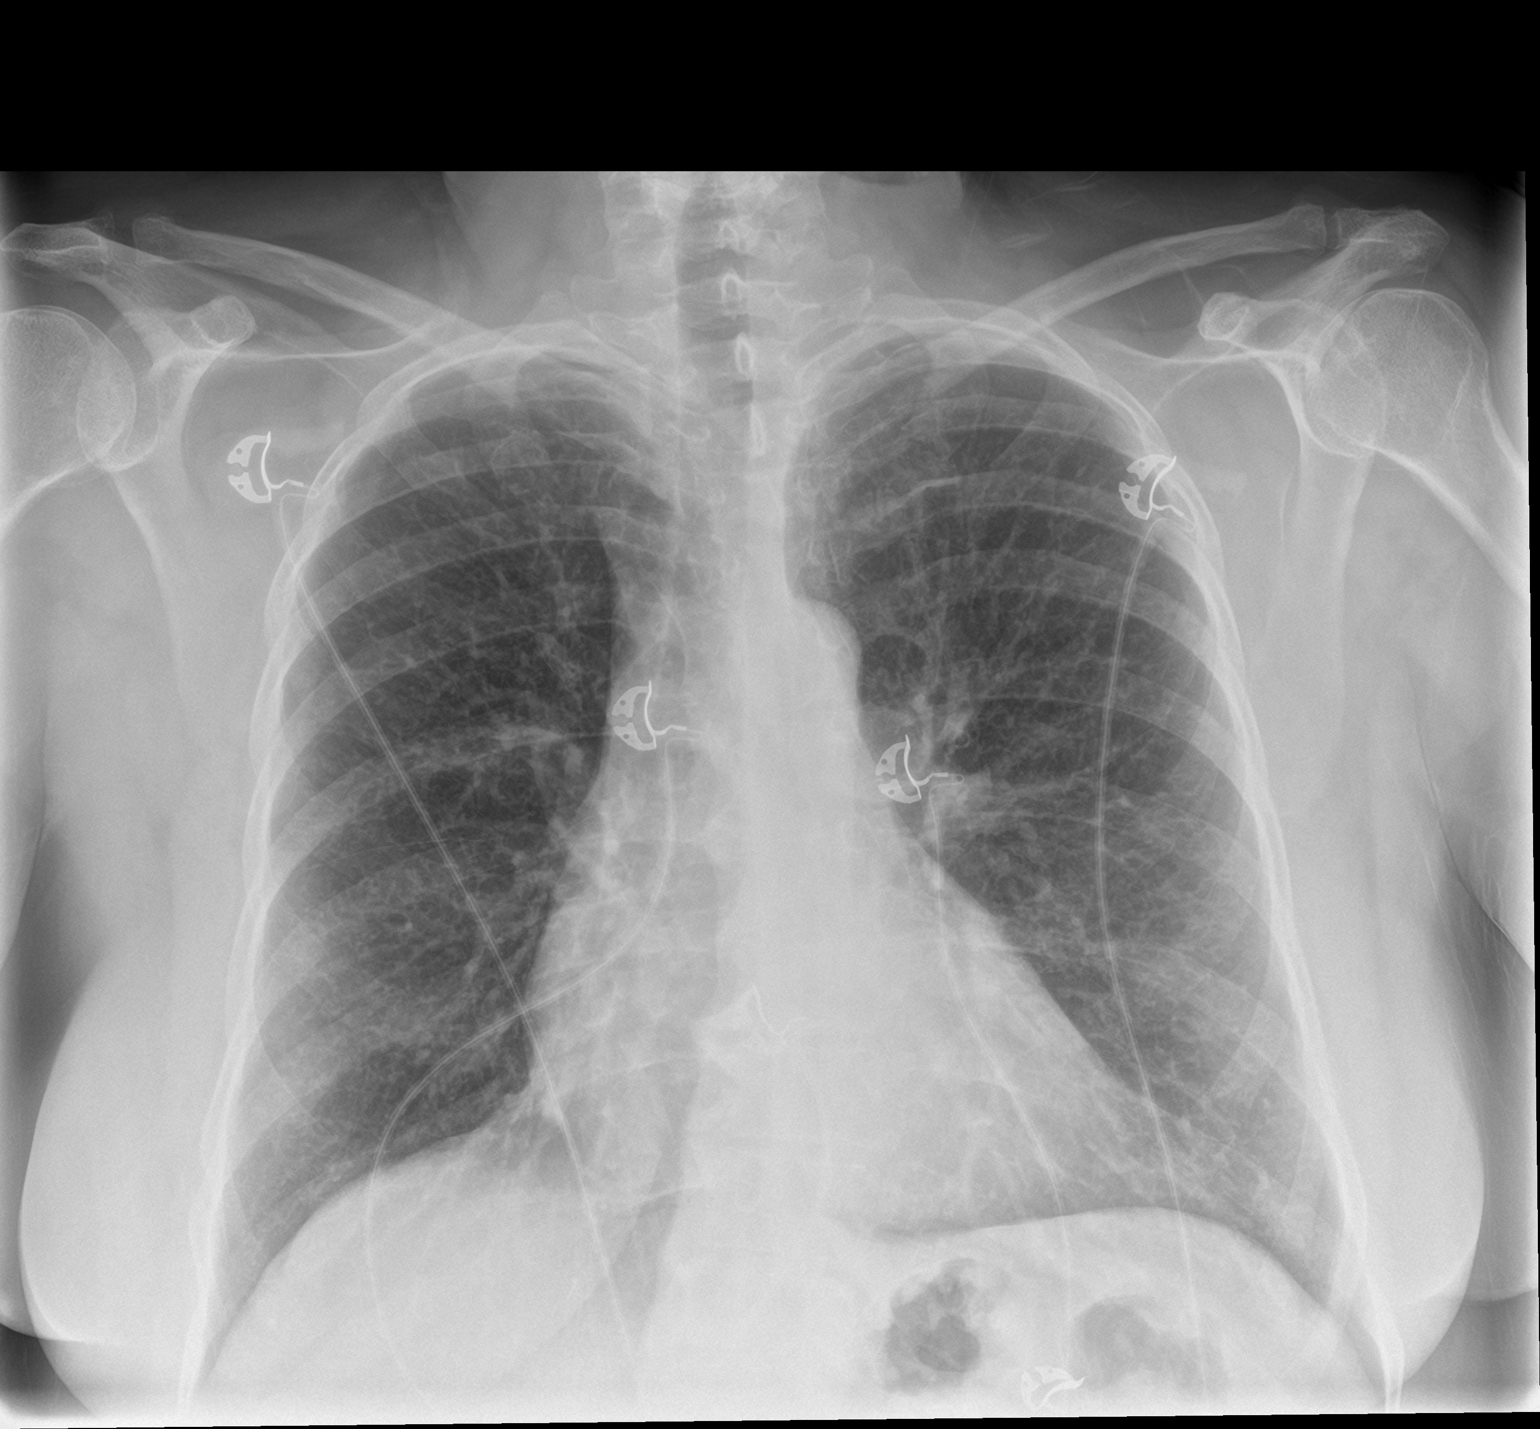

[chest lat]
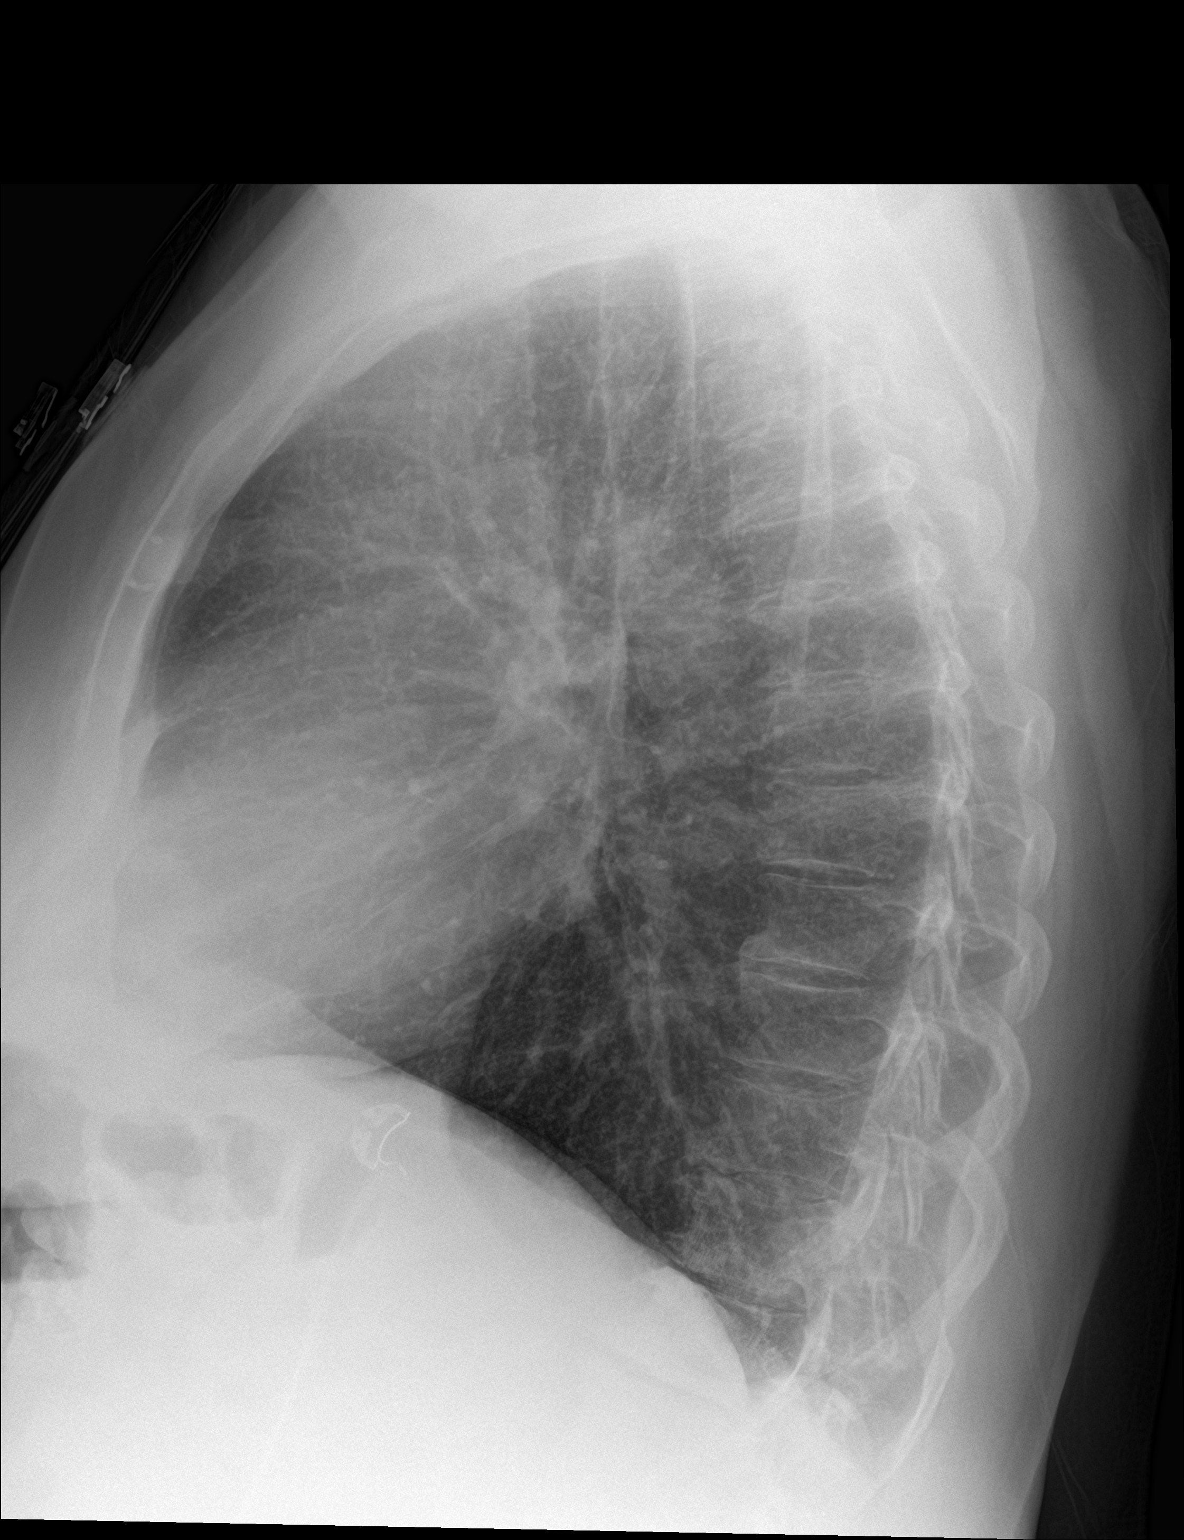

[2 of 2 positions shown; findings below may reference images not displayed]

FINDINGS: COPD with pulmonary hyperinflation. Negative for infiltrate or
effusion. Negative for mass or adenopathy. Heart size upper normal.
IMPRESSION: COPD without acute abnormality.

## 2017-11-23 ENCOUNTER — Encounter: Payer: Self-pay | Admitting: Internal Medicine

## 2017-11-23 ENCOUNTER — Ambulatory Visit (INDEPENDENT_AMBULATORY_CARE_PROVIDER_SITE_OTHER): Payer: Medicare HMO | Admitting: Internal Medicine

## 2017-11-23 VITALS — BP 124/72 | HR 66 | Ht 63.0 in | Wt 215.2 lb

## 2017-11-23 DIAGNOSIS — Z87891 Personal history of nicotine dependence: Secondary | ICD-10-CM | POA: Diagnosis not present

## 2017-11-23 DIAGNOSIS — J84115 Respiratory bronchiolitis interstitial lung disease: Secondary | ICD-10-CM | POA: Diagnosis not present

## 2017-11-23 MED ORDER — VARENICLINE TARTRATE 0.5 MG PO TABS: ORAL_TABLET | ORAL | 0 refills | Status: DC

## 2017-11-23 NOTE — Patient Instructions (Addendum)
ICD-10-CM   1. Respiratory bronchiolitis interstitial lung disease ([place]) J84.115   2. History of smoking 25-50 pack years Z87.891     you have inflammation in lung tissue - a disease called RB-ILD  Plan  disease can be cured if you quii smoking Understand chantix too expensive   - so take discount card  - take another prescription - #SMoking  - glad you want to quit and I understand you are using expired samples -  start chantix as directed  - Days 1-3: 0.5 mg once daily  - Days 4-7: 0.5 mg twice daily  - Maintenance (? Day 8):  1 mg twice daily for 11 weeks   - if you have bad dream stop medication and call us  - take the medicaiton as instructed and after food - quit smoking  1 week after starting chantix     Followup 9 months or sooner if needed

## 2017-11-23 NOTE — Progress Notes (Signed)
Subjective:     Patient ID: Jenna Hancock, female   DOB: 15-Jan-1961, 57 y.o.   MRN: [id number]  HPI PCP Pomposini, Cherly Anderson, MD   HPI   IOV 03/19/2017  Chief Complaint  Patient presents with  . Advice Only    Abnormal cxr 01/22/17 and due to that, pt requested to come to our office for appt. C/o SOB on exertion with occ. dry cough and occ. chest tightness. Pt stated that in May and June she was cleaning her place out due to having rat infestation and became real sick then and hasn't felt the same since.   Jenna Hancock 1960-09-12 : 57 year old female whose mother Atiana Levier that I take care of for COPD. Patient herself is a smoker greater than 30 pack per day. But at baseline she has never had any respiratory issues. [place] with her boyfriend who is here with her today Then approximately the first week of May 2018: She took it upon herself to enter and closed small room that are not being visited in 1 year. The room contained chicken feed. There was significant rat infestation. She used a leaf blower without a mass and for 3 hours guarded of the rats. During this time a lot of rat feces she inhale and was exposed to. One day after this she started having fever and wheezing and shortness of breath and feeling extremely rundown. Then on 10/26/2016 She went to the ER in May 2018 with fever and generalized body aches following tick bite 2. She did not tell the emergency room doctors about exposure to rat feces because of embarrassment.  During this time wheezing was noticed on the physical exam. She was discharged on doxycycline. It looks like she did not get prednisone. After this she reports she's been to other urgent care but did not get prednisone and steroids. Then in June 2018 she saw primary care for chronic diarrhea At this visit she reported she lost 22 pounds. C. difficile test was negative. Then in 02/01/2017 she went to the emergency department because of shortness of  breath after mowing grass on a hot humid day. In the emergency room she did not have wheezing. She was discharged after albuterol and dexamethasone taper. She tells me that till this day that she is much better but she still has wheezing and shortness of breath and feeling fatigued and is not fully better. She is extremely worried about her respiratory issues    Chest x-ray 02/01/2017: Reported a COPD without any abnormality but to me on my personal visualization is only mildly hyperinflated but otherwise clear  Blood lab work 01/22/2017: Troponin normal, BMP normal, liver function test normal and creatinine normal and white count normal  Walking desaturation test 185 feet 3 laps on room air with a full head probe: Resting heart rate 57/m. Peak heart rate 85/m. Resting pulse ox 100%. Final pulse ox 99% and she became short of breath. She did not desaturate.   Exhaled nitric oxide in the office 03/19/2017: 7 ppb  Spirometry 03/19/2017 -> fev1 1/9L/70%, Ratio 76 - office spirometry  Past medical history review shows that she's had a history of pneumonia, sinus infections and sinus nasal surgeries.      06/11/2017 acute extended ov/Wert re:  Chief Complaint  Patient presents with  . Acute Visit    Pt c/o increased SOB, wheezing, chest tightness and prod cough with minimal green sputum- onset was 6 days ago. She  had fever 1 day ago.   cough off and on since May 2018 while on breo and using lots of saba / assoc chest tight better p saba  Shoemaker eval 2 week prior to [facility]  Did not rec abx  Acute worse cough with purulent sputum esp in am since 06/05/17 assoc with nasal congestion/ sore throat/    sob with watery nasal d/c  rx pred / omnicef 12/21/- 12/26 and no better    No obvious day to day or daytime variability or assoc    mucus plugs or hemoptysis or cp or  or overt   hb symptoms. No unusual exposure hx or h/o childhood pna/ asthma or knowledge of premature birth.  Sleeping ok flat  without nocturnal    exacerbation  of respiratory  c/o's or need for noct saba. Also denies any obvious fluctuation of symptoms with weather or environmental changes or other aggravating or alleviating factors except as outlined above    OV 06/25/2017  Chief Complaint  Patient presents with  . Follow-up    Pt was [facility] by [facility]11[facility]17 and was dx with bronchitis. Pt went to ENT, Dr. Wilburn Cornelia and is currently on an abx. Pt has complaints of a cough with yellow mucus. Denies any SOB or CP   Jenna Hancock returns for follow-up.  She has respiratory symptoms following rat  antigen exposure in May 2018.  She continues to smoke and she continues to have respiratory symptoms.  Around Christmas 2018 she started feeling sick.  June 11, 2017 she saw my colleague Dr. Christinia Gully who advised her to stop smoking given antibiotic and prednisone.  Did a chest x-ray that I personally visualized that shows bronchitic changes but also my opinion that might be interstitial lung disease changes.  She started feeling better with and followed up with ENT doctor Dr. Wilburn Cornelia who has her on prolonged antibiotic therapy right now.  Overall she is better but she feels stable.  Nevertheless she feels she is a new new low baseline.  She feels fatigued all the time.  She is frustrated with  symptomatology but she continues to smoke.   PFT 04/22/17 - normal FENO 12 ppb 06/25/2017   OV 11/23/2017  Chief Complaint  Patient presents with  . Follow-up    Pt states she has been doing well since last visit and denies any complaints or concerns.    Jenna Hancock , 57 y.o. , with dob March 11, 1961 and female ,Not Hispanic or Latino from [address] [place] [address] - presents to pulm clinic for cough and resp symptoms due to smoking and chicken coop expisure with rapid antigen.  At the end of last visit in January 2019 we did a high-resolution CT scan of the chest and this shows RB ILD findings.  Autoimmune and hypersensitivity  pneumonitis panel was negative.  Currently she says she has cut down on smoking and with this her respiratory symptoms have improved.  In addition she also has cut down exposure to the chicken coop.  In fact she has eliminated this.  She still has some symptoms that are described below on the CAT score even though she does not have COPD.  The symptoms are relatively new compared to the pre-exposure days but they are improving.  Albuterol does help this.  We discussed quitting smoking because she still continues to smoke.  Several years ago she quit with the help of Chantix for 1-1/2 years but then relapsed.  She says  Chantix because her $200 a month while she is spending only 25 or $30 a month for smoking.  She is try to get a lower price on the Chantix.  Apparently a friend of hers is trying to help her with the discount program.  We did give her a discount card.     CAT COPD Symptom & Quality of Life Score (GSK trademark) 0 is no burden. 5 is highest burden 11/23/2017   Never Cough -> Cough all the time 3  No phlegm in chest -> Chest is full of phlegm 1  No chest tightness -> Chest feels very tight 2  No dyspnea for 1 flight stairs/hill -> Very dyspneic for 1 flight of stairs 3  No limitations for ADL at home -> Very limited with ADL at home 0  Confident leaving home -> Not at all confident leaving home -0  Sleep soundly -> Do not sleep soundly because of lung condition 2  Lots of Energy -> No energy at all 2  TOTAL Score (max 40)  13         has a past medical history of Anxiety, Essential hypertension, benign (12/08/2016), GERD (gastroesophageal reflux disease), High cholesterol (12/08/2016), Hyperlipidemia, Hypertension, Sleep apnea, and TIA (transient ischemic attack) (2017).   reports that she has been smoking cigarettes.  She has a 34.00 pack-year smoking history. She has never used smokeless tobacco.  Past Surgical History:  Procedure Laterality Date  . APPENDECTOMY    . JOINT  REPLACEMENT     rt knee 11 yrs ago  . NASAL SINUS SURGERY      Allergies  Allergen Reactions  . Bactrim Rash  . Sulfa Antibiotics Rash    Immunization History  Administered Date(s) Administered  . Influenza,inj,Quad PF,6+ Mos 04/16/2014, 04/16/2017  . Influenza,inj,quad, With Preservative 04/20/2017  . Influenza-Unspecified 04/25/2015  . Tdap 02/16/2012    Family History  Problem Relation Age of Onset  . Diabetes Father   . Stroke Father   . Heart disease Father   . Lung cancer Mother      Current Outpatient Medications:  .  albuterol (PROVENTIL HFA;VENTOLIN HFA) 108 (90 Base) MCG/ACT inhaler, Proventil HFA 90 mcg/actuation aerosol inhaler  Inhale 2 puffs 4 times a day by inhalation route as needed., Disp: , Rfl:  .  ALPRAZolam (XANAX) 0.5 MG tablet, Take 0.5 mg by mouth as needed for anxiety., Disp: , Rfl:  .  Ascorbic Acid (VITAMIN C) 1000 MG tablet, Take 1,000 mg by mouth daily., Disp: , Rfl:  .  aspirin 325 MG tablet, Take 325 mg by mouth daily., Disp: , Rfl:  .  budesonide-formoterol (SYMBICORT) 80-4.5 MCG/ACT inhaler, Inhale 2 puffs into the lungs 2 (two) times daily., Disp: , Rfl:  .  FLUoxetine (PROZAC) 20 MG capsule, Take 1 capsule by mouth daily., Disp: , Rfl:  .  HYDROcodone-acetaminophen (NORCO) 10-325 MG per tablet, Take 1 tablet by mouth 2 (two) times daily. , Disp: , Rfl:  .  irbesartan-hydrochlorothiazide (AVALIDE) 300-12.5 MG per tablet, Take 1 tablet by mouth daily., Disp: , Rfl:  .  Multiple Vitamin (MULTIVITAMIN) capsule, Take 1 capsule by mouth daily., Disp: , Rfl:  .  Omega-3 Fatty Acids (FISH OIL) 1000 MG CAPS, Take 1,000 mg by mouth 2 (two) times daily., Disp: , Rfl:  .  pantoprazole (PROTONIX) 40 MG tablet, Take 30- 60 min before your first and last meals of the day, Disp: , Rfl:  .  simvastatin (ZOCOR) 20 MG tablet, Take 20  mg by mouth daily., Disp: , Rfl:     Review of Systems     Objective:   Physical Exam  Constitutional: She is oriented  to person, place, and time. She appears well-developed and well-nourished. No distress.  HENT:  Head: Normocephalic and atraumatic.  Right Ear: External ear normal.  Left Ear: External ear normal.  Mouth/Throat: Oropharynx is clear and moist. No oropharyngeal exudate.  Eyes: Pupils are equal, round, and reactive to light. Conjunctivae and EOM are normal. Right eye exhibits no discharge. Left eye exhibits no discharge. No scleral icterus.  Neck: Normal range of motion. Neck supple. No JVD present. No tracheal deviation present. No thyromegaly present.  Cardiovascular: Normal rate, regular rhythm, normal heart sounds and intact distal pulses. Exam reveals no gallop and no friction rub.  No murmur heard. Pulmonary/Chest: Effort normal and breath sounds normal. No respiratory distress. She has no wheezes. She has no rales. She exhibits no tenderness.  Abdominal: Soft. Bowel sounds are normal. She exhibits no distension and no mass. There is no tenderness. There is no rebound and no guarding.  Musculoskeletal: Normal range of motion. She exhibits no edema or tenderness.  Lymphadenopathy:    She has no cervical adenopathy.  Neurological: She is alert and oriented to person, place, and time. She has normal reflexes. No cranial nerve deficit. She exhibits normal muscle tone. Coordination normal.  Skin: Skin is warm and dry. No rash noted. She is not diaphoretic. No erythema. No pallor.  Psychiatric: She has a normal mood and affect. Her behavior is normal. Judgment and thought content normal.  Vitals reviewed.  Vitals:   11/23/17 0944  BP: 124/72  Pulse: 66  SpO2: 98%  Weight: 215 lb 3.2 oz (97.6 kg)  Height: 5\' 3"  (1.6 m)    Estimated body mass index is 38.12 kg/m as calculated from the following:   Height as of this encounter: 5\' 3"  (1.6 m).   Weight as of this encounter: 215 lb 3.2 oz (97.6 kg).     Assessment:       ICD-10-CM   1. Respiratory bronchiolitis interstitial lung disease  ([place]) J84.115   2. 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 Day 8):  1 mg twice daily for 11 weeks   - if you have bad dream stop medication and call us  - take the medicaiton as instructed and after food - quit smoking  1 week after starting chantix     Followup 9 months or sooner if needed  > 50% of this > 25 min visit spent in face to face counseling or coordination of care    Dr. Brand Males, M.D., [facility].C.P Pulmonary and Critical Care Medicine Staff Physician, [place] Director - Interstitial Lung Disease  Program  Pulmonary [place] [facility] [place], [place], [address]  Pager: [phone], If no answer or between  15:00h - 7:00h: call [phone]  [place]: [phone]

## 2018-04-26 IMAGING — CT CT CHEST HIGH RESOLUTION W/O CM
2 of 6 series · 15 of 36 positions shown, 18 images · non-contrast
Comparison: 06/11/2017 chest radiograph.  1159 chest CT angiogram.

CLINICAL DATA: Persistent cough and congestion. History of smoking
for 35 years.

EXAM:
CT CHEST WITHOUT CONTRAST
TECHNIQUE: Multidetector CT imaging of the chest was performed following the
standard protocol without intravenous contrast. High resolution
imaging of the lungs, as well as inspiratory and expiratory imaging,
was performed.

[Series 4: thorax · axial · 0.67mm/px · z∈[+1242,+1536]mm · 12 of 165 slices shown, 15 images]
[im 9/165  mediastinal]
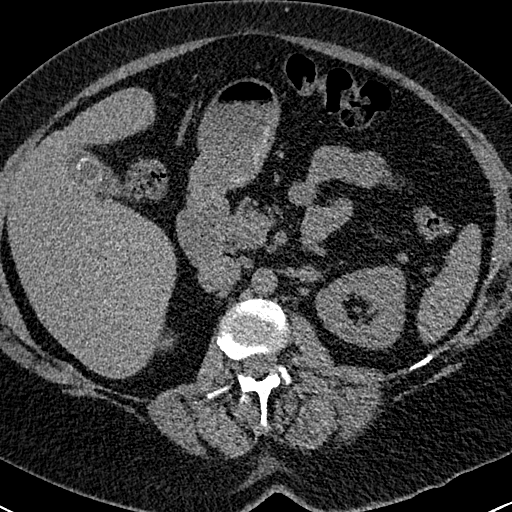
[im 9/165  lung]
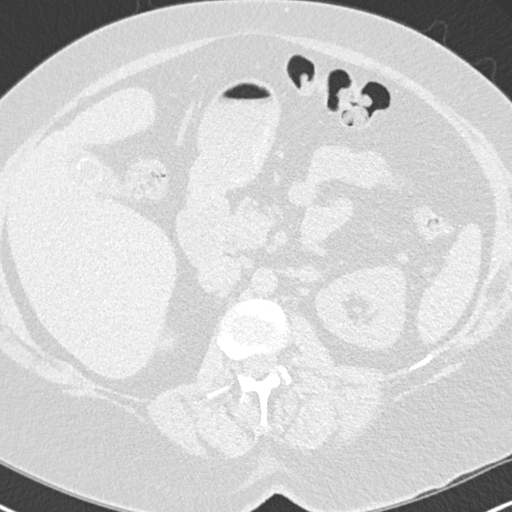
[im 26/165  lung]
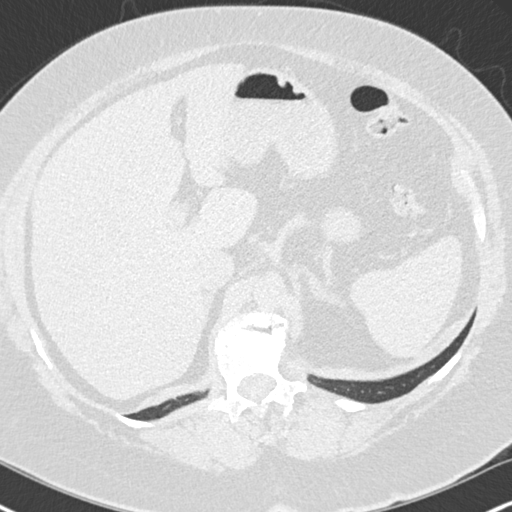
[im 35/165  lung]
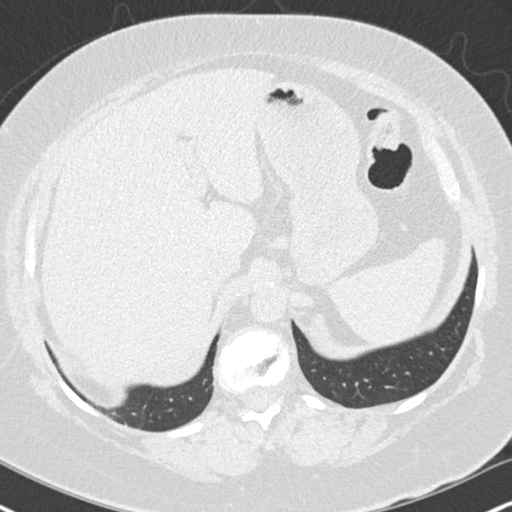
[im 52/165  lung]
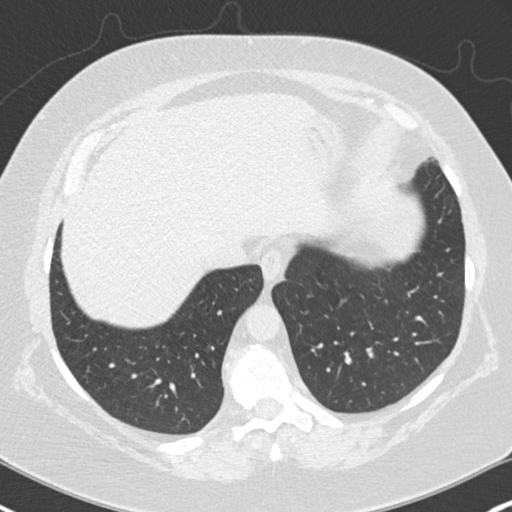
[im 61/165  mediastinal]
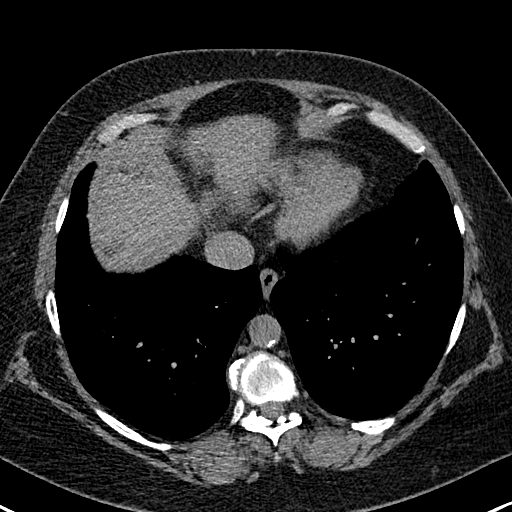
[im 61/165  lung]
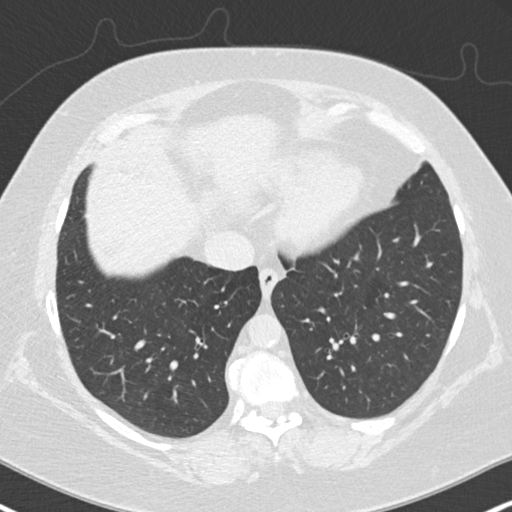
[im 78/165  lung]
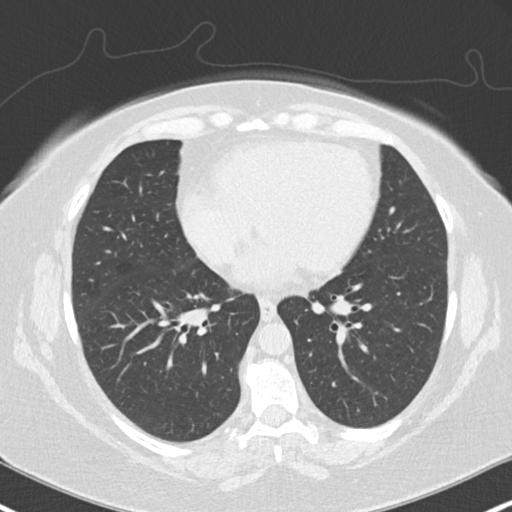
[im 87/165  lung]
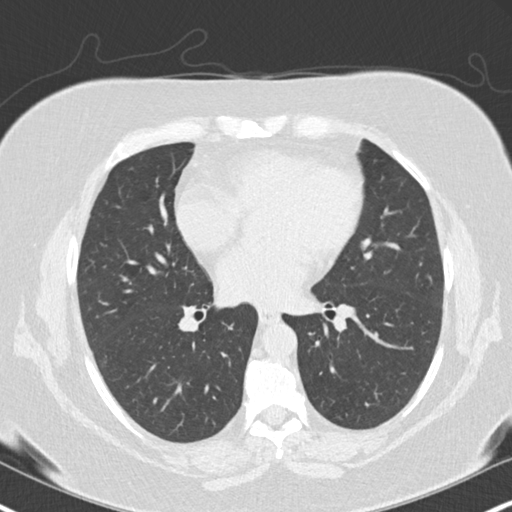
[im 104/165  lung]
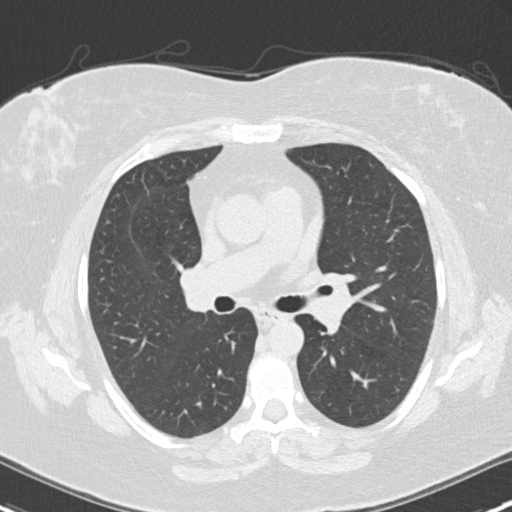
[im 113/165  mediastinal]
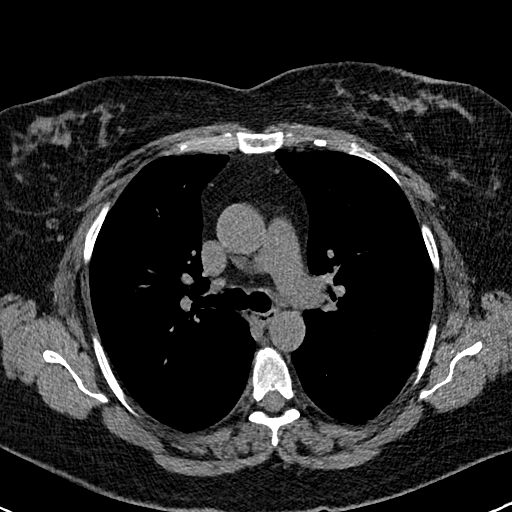
[im 113/165  lung]
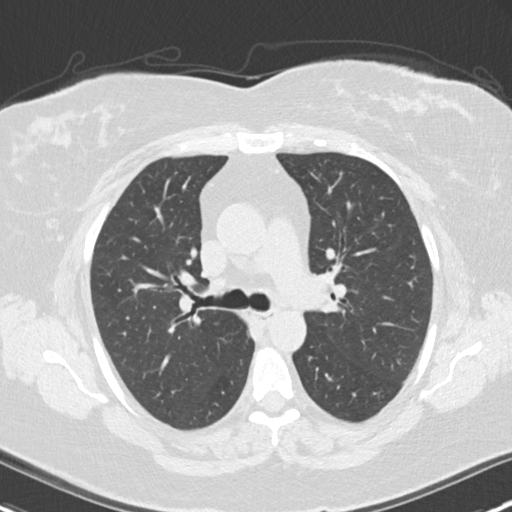
[im 130/165  lung]
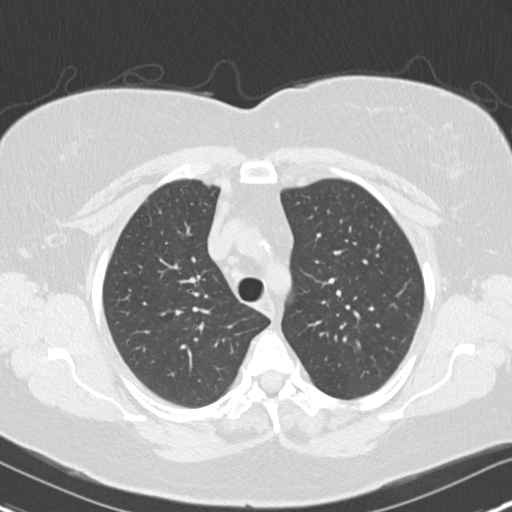
[im 139/165  lung]
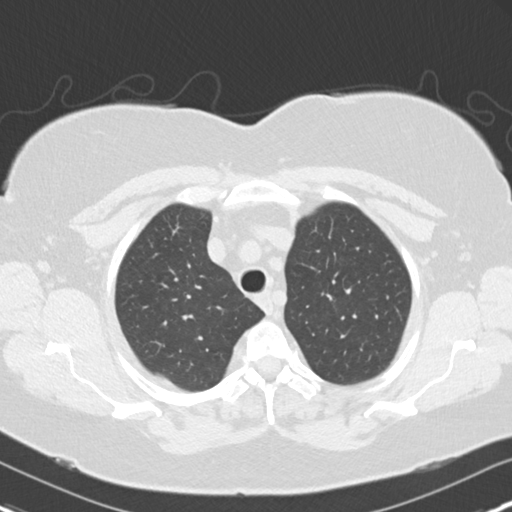
[im 156/165  lung]
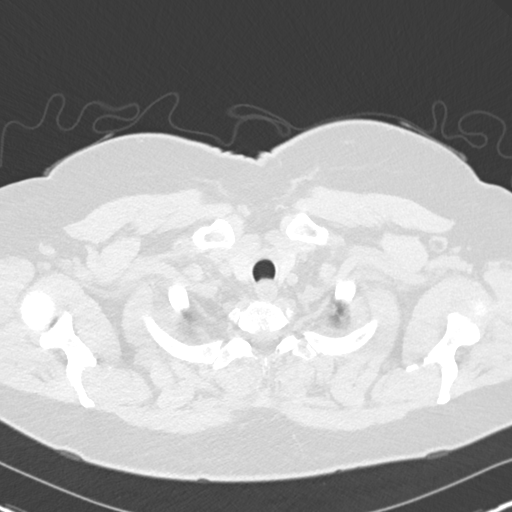

[Series 8: coronal · coronal · 0.69mm/px · 3 of 101 slices shown]
[im 21/101  lung]
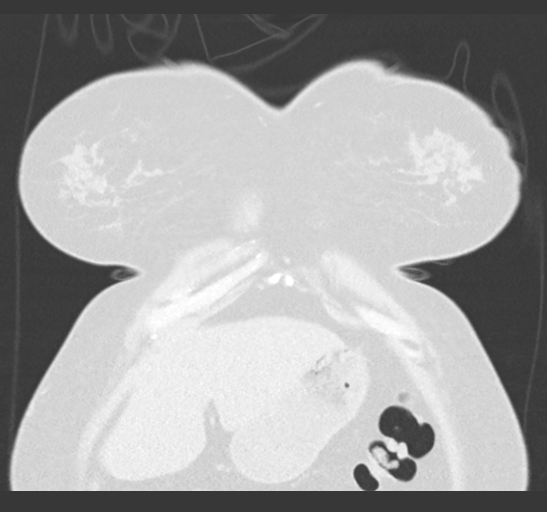
[im 41/101  lung]
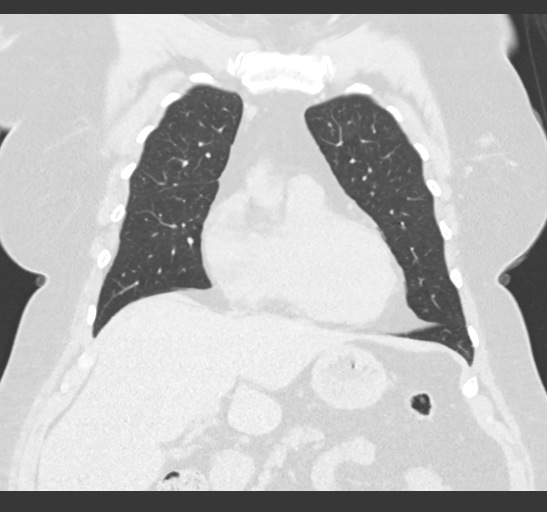
[im 61/101  lung]
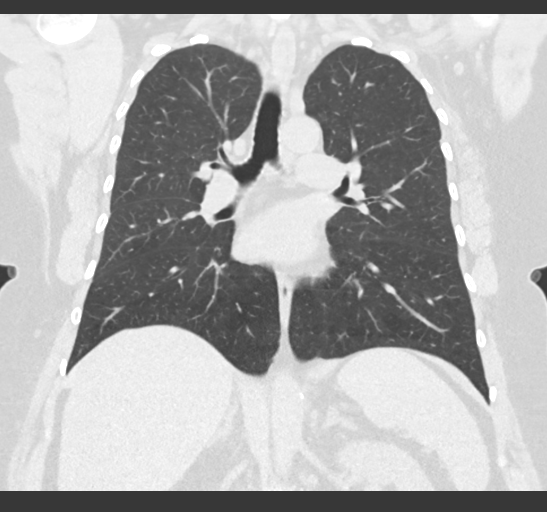

[15 of 36 positions shown; findings below may reference images not displayed]

FINDINGS: Cardiovascular: Normal heart size. Left anterior descending coronary
atherosclerosis. No significant pericardial effusion/thickening.
Atherosclerotic nonaneurysmal thoracic aorta. Normal caliber
pulmonary arteries.

Mediastinum/Nodes: No discrete thyroid nodules. Unremarkable
esophagus. No pathologically enlarged axillary, mediastinal or gross
hilar lymph nodes, noting limited sensitivity for the detection of
hilar adenopathy on this noncontrast study.

Lungs/Pleura: No pneumothorax. No pleural effusion. No acute
consolidative airspace disease or lung masses. Mild-to-moderate
patchy air trapping in both lungs on the expiration sequence.
Minimal patchy ground-glass centrilobular micro nodularity in the
upper lobes. No significant regions of subpleural reticulation,
parenchymal banding, traction bronchiectasis, architectural
distortion or frank honeycombing.

Upper abdomen: Cholelithiasis.

Musculoskeletal: No aggressive appearing focal osseous lesions.
Marked thoracic spondylosis.
IMPRESSION: 1. Minimal patchy ground-glass centrilobular micronodularity in the
upper lobes. Findings suggest mild respiratory
bronchiolitis-interstitial lung disease (MOVIL) in this current
symptomatic smoker.
2. Mild to moderate patchy air trapping in both lungs, indicative of
small airways disease.
3. One vessel coronary atherosclerosis.
4. Cholelithiasis.

Aortic Atherosclerosis ([id number]).

## 2018-08-17 ENCOUNTER — Encounter: Payer: Self-pay | Admitting: Internal Medicine

## 2018-08-17 ENCOUNTER — Ambulatory Visit (INDEPENDENT_AMBULATORY_CARE_PROVIDER_SITE_OTHER): Payer: Medicare PPO | Admitting: Internal Medicine

## 2018-08-17 VITALS — BP 128/64 | HR 69 | Ht 64.0 in | Wt 223.0 lb

## 2018-08-17 DIAGNOSIS — Z825 Family history of asthma and other chronic lower respiratory diseases: Secondary | ICD-10-CM | POA: Diagnosis not present

## 2018-08-17 DIAGNOSIS — J84115 Respiratory bronchiolitis interstitial lung disease: Secondary | ICD-10-CM

## 2018-08-17 DIAGNOSIS — J849 Interstitial pulmonary disease, unspecified: Secondary | ICD-10-CM

## 2018-08-17 DIAGNOSIS — Z87891 Personal history of nicotine dependence: Secondary | ICD-10-CM

## 2018-08-17 NOTE — Progress Notes (Signed)
Subjective:     Patient ID: Jenna Hancock, female   DOB: 15-Oct-1960, 58 y.o.   MRN: [id number]  HPI PCP Pomposini, Cherly Anderson, MD   HPI   IOV 03/19/2017  Chief Complaint  Patient presents with  . Advice Only    Abnormal cxr 01/22/17 and due to that, pt requested to come to our office for appt. C/o SOB on exertion with occ. dry cough and occ. chest tightness. Pt stated that in May and June she was cleaning her place out due to having rat infestation and became real sick then and hasn't felt the same since.   Jenna Hancock 01/18/1961 : 58 year old female whose mother Selinda Korzeniewski that I take care of for COPD. Patient herself is a smoker greater than 30 pack per day. But at baseline she has never had any respiratory issues. [place] with her boyfriend who is here with her today Then approximately the first week of May 2018: She took it upon herself to enter and closed small room that are not being visited in 1 year. The room contained chicken feed. There was significant rat infestation. She used a leaf blower without a mass and for 3 hours guarded of the rats. During this time a lot of rat feces she inhale and was exposed to. One day after this she started having fever and wheezing and shortness of breath and feeling extremely rundown. Then on 10/26/2016 She went to the ER in May 2018 with fever and generalized body aches following tick bite 2. She did not tell the emergency room doctors about exposure to rat feces because of embarrassment.  During this time wheezing was noticed on the physical exam. She was discharged on doxycycline. It looks like she did not get prednisone. After this she reports she's been to other urgent care but did not get prednisone and steroids. Then in June 2018 she saw primary care for chronic diarrhea At this visit she reported she lost 22 pounds. C. difficile test was negative. Then in 02/01/2017 she went to the emergency department because of shortness  of breath after mowing grass on a hot humid day. In the emergency room she did not have wheezing. She was discharged after albuterol and dexamethasone taper. She tells me that till this day that she is much better but she still has wheezing and shortness of breath and feeling fatigued and is not fully better. She is extremely worried about her respiratory issues    Chest x-ray 02/01/2017: Reported a COPD without any abnormality but to me on my personal visualization is only mildly hyperinflated but otherwise clear  Blood lab work 01/22/2017: Troponin normal, BMP normal, liver function test normal and creatinine normal and white count normal  Walking desaturation test 185 feet 3 laps on room air with a full head probe: Resting heart rate 57/m. Peak heart rate 85/m. Resting pulse ox 100%. Final pulse ox 99% and she became short of breath. She did not desaturate.   Exhaled nitric oxide in the office 03/19/2017: 7 ppb  Spirometry 03/19/2017 -> fev1 1/9L/70%, Ratio 76 - office spirometry  Past medical history review shows that she's had a history of pneumonia, sinus infections and sinus nasal surgeries.      06/11/2017 acute extended ov/Wert re:  Chief Complaint  Patient presents with  . Acute Visit    Pt c/o increased SOB, wheezing, chest tightness and prod cough with minimal green sputum- onset was 6 days  ago. She had fever 1 day ago.   cough off and on since May 2018 while on breo and using lots of saba / assoc chest tight better p saba  Shoemaker eval 2 week prior to [place]  Did not rec abx  Acute worse cough with purulent sputum esp in am since 06/05/17 assoc with nasal congestion/ sore throat/    sob with watery nasal d/c  rx pred / omnicef 12/21/- 12/26 and no better    No obvious day to day or daytime variability or assoc    mucus plugs or hemoptysis or cp or  or overt   hb symptoms. No unusual exposure hx or h/o childhood pna/ asthma or knowledge of premature birth.  Sleeping ok flat  without nocturnal    exacerbation  of respiratory  c/o's or need for noct saba. Also denies any obvious fluctuation of symptoms with weather or environmental changes or other aggravating or alleviating factors except as outlined above    OV 06/25/2017  Chief Complaint  Patient presents with  . Follow-up    Pt was seen by [facility] 06/11/17 and was dx with bronchitis. Pt went to ENT, Dr. Wilburn Cornelia and is currently on an abx. Pt has complaints of a cough with yellow mucus. Denies any SOB or CP   Jenna Hancock returns for follow-up.  She has respiratory symptoms following rat  antigen exposure in May 2018.  She continues to smoke and she continues to have respiratory symptoms.  Around Christmas 2018 she started feeling sick.  June 11, 2017 she saw my colleague Dr. Christinia Gully who advised her to stop smoking given antibiotic and prednisone.  Did a chest x-ray that I personally visualized that shows bronchitic changes but also my opinion that might be interstitial lung disease changes.  She started feeling better with and followed up with ENT doctor Dr. Wilburn Cornelia who has her on prolonged antibiotic therapy right now.  Overall she is better but she feels stable.  Nevertheless she feels she is a new new low baseline.  She feels fatigued all the time.  She is frustrated with  symptomatology but she continues to smoke.   PFT 04/22/17 - normal FENO 12 ppb 06/25/2017   OV 11/23/2017  Chief Complaint  Patient presents with  . Follow-up    Pt states she has been doing well since last visit and denies any complaints or concerns.    Jenna Hancock , 58 y.o. , with dob 1961/02/25 and female ,Not Hispanic or Latino from [address] [place] [address] - presents to pulm clinic for cough and resp symptoms due to smoking and chicken coop expisure with rapid antigen.  At the end of last visit in January 2019 we did a high-resolution CT scan of the chest and this shows RB ILD findings.  Autoimmune and hypersensitivity  pneumonitis panel was negative.  Currently she says she has cut down on smoking and with this her respiratory symptoms have improved.  In addition she also has cut down exposure to the chicken coop.  In fact she has eliminated this.  She still has some symptoms that are described below on the CAT score even though she does not have COPD.  The symptoms are relatively new compared to the pre-exposure days but they are improving.  Albuterol does help this.  We discussed quitting smoking because she still continues to smoke.  Several years ago she quit with the help of Chantix for 1-1/2 years but then relapsed.  She says Chantix because her $200 a month while she is spending only 25 or $30 a month for smoking.  She is try to get a lower price on the Chantix.  Apparently a friend of hers is trying to help her with the discount program.  We did give her a discount card.       OV 08/17/2018  Subjective:  Patient ID: Jenna Hancock, female , DOB: 06-18-1960 , age 25 y.o. , MRN: [id number] , ADDRESS: [place] [id number]   08/17/2018 -   Chief Complaint  Patient presents with  . Follow-up    Pt states she has been doing well since last visit and denies any concerns.  FU smoking FU RB-ILD  HPI Jenna Hancock 58 y.o. -returns for follow-up.  Last seen June 2019.  With the help of Chantix she quit smoking but the remission last 3 months and approximately 2 months ago she started smoking a cigarette here and there when she tapered her Chantix down to once a day.  Now for the last 2 weeks she is gone back up on the Chantix to twice daily and smoking has been in remission.  Nevertheless these efforts have yielded significant improvement in symptoms and she feels good.  She is trying to avoid the chicken coop.  She reports no new problems.  Chart review shows that she had coronary artery calcification on the previous CT scan of the chest but she denies any chest pain or shortness of breath or wheezing  or cough.    CAT COPD Symptom & Quality of Life Score (GSK trademark) 0 is no burden. 5 is highest burden 11/23/2017   Never Cough -> Cough all the time 3  No phlegm in chest -> Chest is full of phlegm 1  No chest tightness -> Chest feels very tight 2  No dyspnea for 1 flight stairs/hill -> Very dyspneic for 1 flight of stairs 3  No limitations for ADL at home -> Very limited with ADL at home 0  Confident leaving home -> Not at all confident leaving home -0  Sleep soundly -> Do not sleep soundly because of lung condition 2  Lots of Energy -> No energy at all 2  TOTAL Score (max 40)  13    CT chest IMPRESSION: 1. Minimal patchy ground-glass centrilobular micronodularity in the upper lobes. 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 2. Mild to moderate patchy air trapping in both lungs, indicative of small airways disease. 3. One vessel coronary atherosclerosis. 4. Cholelithiasis.  Aortic Atherosclerosis (ICD10-I70.0).   Electronically Signed   By: Ilona Sorrel M.D.   On: 07/07/2017 17:08  ROS - per HPI     has a past medical history of Anxiety, Essential hypertension, benign (12/08/2016), GERD (gastroesophageal reflux disease), High cholesterol (12/08/2016), Hyperlipidemia, Hypertension, Sleep apnea, and TIA (transient ischemic attack) (2017).   reports that she quit smoking about 5 months ago. Her smoking use included cigarettes. She has a 34.00 pack-year smoking history. She has never used smokeless tobacco.  Past Surgical History:  Procedure Laterality Date  . APPENDECTOMY    . JOINT REPLACEMENT     rt knee 11 yrs ago  . NASAL SINUS SURGERY      Allergies  Allergen Reactions  . Bactrim Rash  . Sulfa Antibiotics Rash    Immunization History  Administered Date(s) Administered  . Influenza Inj Mdck Quad With Preservative 04/05/2018  . Influenza,inj,Quad  PF,6+ Mos 04/16/2014,  04/16/2017  . Influenza,inj,quad, With Preservative 04/20/2017  . Influenza-Unspecified 04/25/2015  . Tdap 02/16/2012  . Zoster Recombinat (Shingrix) 03/08/2018    Family History  Problem Relation Age of Onset  . Diabetes Father   . Stroke Father   . Heart disease Father   . Lung cancer Mother      Current Outpatient Medications:  .  albuterol (PROVENTIL HFA;VENTOLIN HFA) 108 (90 Base) MCG/ACT inhaler, Proventil HFA 90 mcg/actuation aerosol inhaler  Inhale 2 puffs 4 times a day by inhalation route as needed., Disp: , Rfl:  .  ALPRAZolam (XANAX) 0.5 MG tablet, Take 0.5 mg by mouth as needed for anxiety., Disp: , Rfl:  .  Ascorbic Acid (VITAMIN C) 1000 MG tablet, Take 1,000 mg by mouth daily., Disp: , Rfl:  .  aspirin 325 MG tablet, Take 325 mg by mouth daily., Disp: , Rfl:  .  budesonide-formoterol (SYMBICORT) 80-4.5 MCG/ACT inhaler, Inhale 2 puffs into the lungs 2 (two) times daily., Disp: , Rfl:  .  FLUoxetine (PROZAC) 20 MG capsule, Take 1 capsule by mouth daily., Disp: , Rfl:  .  HYDROcodone-acetaminophen (NORCO) 10-325 MG per tablet, Take 1 tablet by mouth 2 (two) times daily. , Disp: , Rfl:  .  irbesartan-hydrochlorothiazide (AVALIDE) 300-12.5 MG per tablet, Take 1 tablet by mouth daily., Disp: , Rfl:  .  Multiple Vitamin (MULTIVITAMIN) capsule, Take 1 capsule by mouth daily., Disp: , Rfl:  .  Omega-3 Fatty Acids (FISH OIL) 1000 MG CAPS, Take 1,000 mg by mouth 2 (two) times daily., Disp: , Rfl:  .  pantoprazole (PROTONIX) 40 MG tablet, Take 30- 60 min before your first and last meals of the day, Disp: , Rfl:  .  simvastatin (ZOCOR) 20 MG tablet, Take 20 mg by mouth daily., Disp: , Rfl:  .  varenicline (CHANTIX) 0.5 MG tablet, - Days 1-3: 0.5 mg once daily- Days 4-7: 0.5 mg twice daily- Maintenance dose:  1 mg twice daily for 11 weeks, Disp: 120 tablet, Rfl: 0      Objective:   Vitals:   08/17/18 0928  BP: 128/64  Pulse: 69  SpO2: 97%  Weight: 223 lb (101.2 kg)  Height:  5\' 4"  (1.626 m)    Estimated body mass index is 38.28 kg/m as calculated from the following:   Height as of this encounter: 5\' 4"  (1.626 m).   Weight as of this encounter: 223 lb (101.2 kg).  @WEIGHTCHANGE @  [employer]   08/17/18 0928  Weight: 223 lb (101.2 kg)     Physical Exam  General Appearance:    Alert, cooperative, no distress, appears stated age - yes , Deconditioned looking - yes , OBESE  - y3s, Sitting on Wheelchair -  no  Head:    Normocephalic, without obvious abnormality, atraumatic  Eyes:    PERRL, conjunctiva/corneas clear,  Ears:    Normal TM's and external ear canals, both ears  Nose:   Nares normal, septum midline, mucosa normal, no drainage    or sinus tenderness. OXYGEN ON  - no . Patient is @ ra   Throat:   Lips, mucosa, and tongue normal; teeth and gums normal. Cyanosis on lips - no  Neck:   Supple, symmetrical, trachea midline, no adenopathy;    thyroid:  no enlargement/tenderness/nodules; no carotid   bruit or JVD  Back:     Symmetric, no curvature, ROM normal, no CVA tenderness  Lungs:     Distress - no , Wheeze no,  Barrell Chest - no, Purse lip breathing - no, Crackles - no   Chest Wall:    No tenderness or deformity.    Heart:    Regular rate and rhythm, S1 and S2 normal, no rub   or gallop, Murmur - no  Breast Exam:    NOT DONE  Abdomen:     Soft, non-tender, bowel sounds active all four quadrants,    no masses, no organomegaly. Visceral obesity - yes  Genitalia:   NOT DONE  Rectal:   NOT DONE  Extremities:   Extremities - normal, Has Cane - no, Clubbing - no, Edema - no  Pulses:   2+ and symmetric all extremities  Skin:   Stigmata of Connective Tissue Disease - no  Lymph nodes:   Cervical, supraclavicular, and axillary nodes normal  Psychiatric:  Neurologic:   Pleasant - yes, Anxious - no, Flat affect - yes  CAm-ICU - neg, Alert and Oriented x 3 - yes, Moves all 4s - yes, Speech - normal, Cognition - intact           Assessment:         ICD-10-CM   1. 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 History of smoking 25-50 pack years Z87.891   3. Family history of COPD (chronic obstructive pulmonary disease) Z82.5        Plan:     Patient Instructions     ICD-10-CM   1. 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 History of smoking 25-50 pack years Z87.891   3. Family history of COPD (chronic obstructive pulmonary disease) Z82.5     Glad much better Glad significant inroads into quitting smoking  Plan  - cotninue chantix for another 3 months without a single puff of cigarette before we taper to off  - do repeat HRCT in 3 months  Provided you are smoke free -if repeat HRCT also shows coronary artery calcification - then we can look at cardiology referral - Please talk to PCP Pomposini, Cherly Anderson, MD -  and ensure you get  shingrix ([place]) inactivated vaccine against shingles   Folllwup 3 months but after CT chest     SIGNATURE    Dr. Brand Males, M.D., F.C.C.P,  Pulmonary and Critical Care Medicine Staff Physician, [place] Director - Interstitial Lung Disease  Program  [facility] [place] [facility] [place], [place], [address]  Pager: [phone], If no answer or between  15:00h - 7:00h: call [phone] Telephone: [phone]  10:28 AM 08/17/2018

## 2018-08-17 NOTE — Patient Instructions (Addendum)
ICD-10-CM   1. 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 History of smoking 25-50 pack years Z87.891   3. Family history of COPD (chronic obstructive pulmonary disease) Z82.5     Glad much better Glad significant inroads into quitting smoking  Plan  - cotninue chantix for another 3 months without a single puff of cigarette before we taper to off  - do repeat HRCT in 3 months  Provided you are smoke free -if repeat HRCT also shows coronary artery calcification - then we can look at cardiology referral - Please talk to PCP Pomposini, Cherly Anderson, MD -  and ensure you get  shingrix ([place]) inactivated vaccine against shingles   Folllwup 3 months but after CT chest

## 2018-11-12 ENCOUNTER — Ambulatory Visit: Payer: Medicare PPO | Admitting: Internal Medicine

## 2018-11-17 ENCOUNTER — Ambulatory Visit (HOSPITAL_COMMUNITY): Admission: RE | Admit: 2018-11-17 | Payer: Medicare PPO | Source: Ambulatory Visit

## 2019-02-22 ENCOUNTER — Telehealth: Payer: Self-pay | Admitting: Internal Medicine

## 2019-02-22 NOTE — Telephone Encounter (Signed)
ATC, line went to voicemail. LMTCB x1. 

## 2019-02-23 NOTE — Telephone Encounter (Signed)
LMTCB x2 for pt 

## 2019-02-24 ENCOUNTER — Telehealth: Payer: Self-pay | Admitting: Internal Medicine

## 2019-02-24 MED ORDER — PNEUMOVAX 23 25 MCG/0.5ML IJ INJ: 0.5000 mL | INJECTION | Freq: Once | INTRAMUSCULAR | 0 refills | Status: AC

## 2019-02-24 NOTE — Telephone Encounter (Signed)
Spoke with the pt and notified of recs per MR  Rx was sent to her pharm with note to pharmacist of need for vaccine  Nothing further needed

## 2019-02-24 NOTE — Telephone Encounter (Signed)
Patient has ILD and smoking history - so qualifies for pneumovax esp with ILD dx for age < 4

## 2019-02-24 NOTE — Telephone Encounter (Signed)
Spoke with pt, she states she needs a prescription stating she needs the pneumonia vaccine because her insurance will not pay for it because she is not 58 years old. She would like [name] to fax the Rx to [facility] in [place]. Their fax number is [phone]. MR please advise if we can send Rx with reasoning why she should have pneumonia vaccine.

## 2019-02-24 NOTE — Telephone Encounter (Signed)
LMTCB x3 for pt. We have attempted to contact pt several times with no success or call back from pt. Per triage protocol, message will be closed.   

## 2019-04-01 ENCOUNTER — Other Ambulatory Visit: Payer: Self-pay

## 2019-04-01 ENCOUNTER — Encounter (HOSPITAL_COMMUNITY): Payer: Self-pay | Admitting: Emergency Medicine

## 2019-04-01 ENCOUNTER — Emergency Department (HOSPITAL_COMMUNITY): Payer: Medicare PPO

## 2019-04-01 ENCOUNTER — Emergency Department (HOSPITAL_COMMUNITY)
Admission: EM | Admit: 2019-04-01 | Discharge: 2019-04-01 | Disposition: A | Discharge status: Home / Self Care | Payer: Medicare PPO | Attending: Emergency Medicine | Admitting: Emergency Medicine

## 2019-04-01 DIAGNOSIS — R509 Fever, unspecified: Secondary | ICD-10-CM | POA: Diagnosis present

## 2019-04-01 DIAGNOSIS — Z5321 Procedure and treatment not carried out due to patient leaving prior to being seen by health care provider: Secondary | ICD-10-CM | POA: Insufficient documentation

## 2019-04-01 NOTE — ED Notes (Signed)
Upon entering room pt not in room.

## 2019-04-01 NOTE — ED Triage Notes (Signed)
Pt co headaches at night, fever, fatigue, chills, nausea, denies vomiting since Tuesday, pt reports she has been taking Tylenol for headaches and fever, pt reports she tested for flu that was negative

## 2019-04-01 NOTE — ED Provider Notes (Signed)
Nurse reports when radiology techs went in the room patient was not there.   Rolland Porter, MD 04/01/19 [phone]

## 2019-11-15 ENCOUNTER — Telehealth: Payer: Self-pay

## 2019-11-15 NOTE — Telephone Encounter (Signed)
Left message for patient to return call to office. Patient left message on nurse line-has some questions.

## 2019-11-16 ENCOUNTER — Ambulatory Visit: Payer: Medicare PPO | Admitting: Internal Medicine

## 2020-01-20 ENCOUNTER — Ambulatory Visit: Payer: Self-pay | Admitting: Orthopedic Surgery

## 2020-01-25 NOTE — Progress Notes (Signed)
DUE TO COVID-19 ONLY ONE VISITOR IS ALLOWED TO COME WITH YOU AND STAY IN THE WAITING ROOM ONLY DURING PRE OP AND PROCEDURE DAY OF SURGERY. THE 1 VISITOR  MAY VISIT WITH YOU AFTER SURGERY IN YOUR PRIVATE ROOM DURING VISITING HOURS ONLY!  YOU NEED TO HAVE A COVID 19 TEST ON_8/14/21 ______ @_______ , THIS TEST MUST BE DONE BEFORE SURGERY,  COVID TESTING SITE [address] [place] [address], IT IS ON THE RIGHT GOING OUT [address] APPROXIMATELY  2 MINUTES PAST [employer] ON THE RIGHT. ONCE YOUR COVID TEST IS COMPLETED,  PLEASE BEGIN THE QUARANTINE INSTRUCTIONS AS OUTLINED IN YOUR HANDOUT.                Jenna Hancock  01/25/2020   Your procedure is scheduled on:              02/01/20   Report to [facility] Main  Entrance   Report to admitting at    0600 AM     Call this number if you have problems the morning of surgery [phone]    Remember: Do not eat food   :After Midnight. BRUSH YOUR TEETH MORNING OF SURGERY AND RINSE YOUR MOUTH OUT, NO CHEWING GUM CANDY OR MINTS.   NO SOLID FOOD AFTER MIDNIGHT THE NIGHT PRIOR TO SURGERY. NOTHING BY MOUTH EXCEPT CLEAR LIQUIDS UNTIL    0530am . PLEASE FINISH ENSURE DRINK PER SURGEON ORDER  WHICH NEEDS TO BE COMPLETED AT .0530am    CLEAR LIQUID DIET   Foods Allowed                                                                      Coffee and tea, regular and decaf                             l Plain Jell-O any favor except red or purple                                       Fruit ices (not with fruit pulp)                                      Iced Popsicles                                    Carbonated beverages, regular and diet                                    Cranberry, grape and apple juices Sports drinks like Gatorade Lightly seasoned clear broth or consume(fat free) Sugar, honey syrup  Coffee or tea _____________________________________________________________________    Take these medicines the morning of surgery with A SIP OF WATER:  Inhalers as usual and bring, Xanax if needed, Wellbutrin, Prozac, Protonix                                 You may not have any metal on your body including hair pins and              piercings  Do not wear jewelry, make-up, lotions, powders or perfumes, deodorant             Do not wear nail polish on your fingernails.  Do not shave  48 hours prior to surgery.              Men may shave face and neck.   Do not bring valuables to the hospital. [place].  Contacts, dentures or bridgework may not be worn into surgery.  Leave suitcase in the car. After surgery it may be brought to your room.     Patients discharged the day of surgery will not be allowed to drive home. IF YOU ARE HAVING SURGERY AND GOING HOME THE SAME DAY, YOU MUST HAVE AN ADULT TO DRIVE YOU HOME AND BE WITH YOU FOR 24 HOURS. YOU MAY GO HOME BY TAXI OR UBER OR ORTHERWISE, BUT AN ADULT MUST ACCOMPANY YOU HOME AND STAY WITH YOU FOR 24 HOURS.  Name and phone number of your driver:                Please read over the following fact sheets you were given: _____________________________________________________________________  [facility] - Preparing for Surgery Before surgery, you can play an important role.  Because skin is not sterile, your skin needs to be as free of germs as possible.  You can reduce the number of germs on your skin by washing with CHG (chlorahexidine gluconate) soap before surgery.  CHG is an antiseptic cleaner which kills germs and bonds with the skin to continue killing germs even after washing. Please DO NOT use if you have an allergy to CHG or antibacterial soaps.  If your skin becomes reddened/irritated stop using the CHG and inform your nurse when you arrive at  Short Stay. Do not shave (including legs and underarms) for at least 48 hours prior to the first CHG shower.  You may shave your face/neck. Please follow these instructions carefully:  1.  Shower with CHG Soap the night before surgery and the  morning of Surgery.  2.  If you choose to wash your hair, wash your hair first as usual with your  normal  shampoo.  3.  After you shampoo, rinse your hair and body thoroughly to remove the  shampoo.                           4.  Use CHG as you would any other liquid soap.  You can apply chg directly  to the skin and wash                       Gently with a scrungie or clean washcloth.  5.  Apply the CHG Soap to your body ONLY FROM THE NECK DOWN.   Do not use on  face/ open                           Wound or open sores. Avoid contact with eyes, ears mouth and genitals (private parts).                       Wash face,  Genitals (private parts) with your normal soap.             6.  Wash thoroughly, paying special attention to the area where your surgery  will be performed.  7.  Thoroughly rinse your body with warm water from the neck down.  8.  DO NOT shower/wash with your normal soap after using and rinsing off  the CHG Soap.                9.  Pat yourself dry with a clean towel.            10.  Wear clean pajamas.            11.  Place clean sheets on your bed the night of your first shower and do not  sleep with pets. Day of Surgery : Do not apply any lotions/deodorants the morning of surgery.  Please wear clean clothes to the hospital/surgery center.  FAILURE TO FOLLOW THESE INSTRUCTIONS MAY RESULT IN THE CANCELLATION OF YOUR SURGERY PATIENT SIGNATURE_________________________________  NURSE SIGNATURE__________________________________  ________________________________________________________________________   Jenna Hancock  An incentive spirometer is a tool that can help keep your lungs clear and active. This tool measures how well you are  filling your lungs with each breath. Taking long deep breaths may help reverse or decrease the chance of developing breathing (pulmonary) problems (especially infection) following:  A long period of time when you are unable to move or be active. BEFORE THE PROCEDURE   If the spirometer includes an indicator to show your best effort, your nurse or respiratory therapist will set it to a desired goal.  If possible, sit up straight or lean slightly forward. Try not to slouch.  Hold the incentive spirometer in an upright position. INSTRUCTIONS FOR USE  1. Sit on the edge of your bed if possible, or sit up as far as you can in bed or on a chair. 2. Hold the incentive spirometer in an upright position. 3. Breathe out normally. 4. Place the mouthpiece in your mouth and seal your lips tightly around it. 5. Breathe in slowly and as deeply as possible, raising the piston or the ball toward the top of the column. 6. Hold your breath for 3-5 seconds or for as long as possible. Allow the piston or ball to fall to the bottom of the column. 7. Remove the mouthpiece from your mouth and breathe out normally. 8. Rest for a few seconds and repeat Steps 1 through 7 at least 10 times every 1-2 hours when you are awake. Take your time and take a few normal breaths between deep breaths. 9. The spirometer may include an indicator to show your best effort. Use the indicator as a goal to work toward during each repetition. 10. After each set of 10 deep breaths, practice coughing to be sure your lungs are clear. If you have an incision (the cut made at the time of surgery), support your incision when coughing by placing a pillow or rolled up towels firmly against it. Once you are able to get out of bed, walk around indoors  and cough well. You may stop using the incentive spirometer when instructed by your caregiver.  RISKS AND COMPLICATIONS  Take your time so you do not get dizzy or light-headed.  If you are in pain,  you may need to take or ask for pain medication before doing incentive spirometry. It is harder to take a deep breath if you are having pain. AFTER USE  Rest and breathe slowly and easily.  It can be helpful to keep track of a log of your progress. Your caregiver can provide you with a simple table to help with this. If you are using the spirometer at home, follow these instructions: [place] IF:   You are having difficultly using the spirometer.  You have trouble using the spirometer as often as instructed.  Your pain medication is not giving enough relief while using the spirometer.  You develop fever of 100.5 F (38.1 C) or higher. SEEK IMMEDIATE MEDICAL CARE IF:   You cough up bloody sputum that had not been present before.  You develop fever of 102 F (38.9 C) or greater.  You develop worsening pain at or near the incision site. MAKE SURE YOU:   Understand these instructions.  Will watch your condition.  Will get help right away if you are not doing well or get worse. Document Released: 10/13/2006 Document Revised: 08/25/2011 Document Reviewed: 12/14/2006 [facility] Patient Information 2014 [place], [place].   ________________________________________________________________________

## 2020-01-26 ENCOUNTER — Encounter (HOSPITAL_COMMUNITY)
Admission: RE | Admit: 2020-01-26 | Discharge: 2020-01-26 | Disposition: A | Discharge status: Home / Self Care | Payer: Medicare PPO | Source: Ambulatory Visit | Attending: Specialist | Admitting: Specialist

## 2020-01-26 ENCOUNTER — Encounter (HOSPITAL_COMMUNITY): Payer: Self-pay

## 2020-01-26 ENCOUNTER — Other Ambulatory Visit: Payer: Self-pay

## 2020-01-26 DIAGNOSIS — Z01818 Encounter for other preprocedural examination: Secondary | ICD-10-CM | POA: Diagnosis present

## 2020-01-26 HISTORY — DX: Cerebral infarction, unspecified: I63.9

## 2020-01-26 HISTORY — DX: Depression, unspecified: F32.A

## 2020-01-26 HISTORY — DX: Nausea with vomiting, unspecified: R11.2

## 2020-01-26 HISTORY — DX: Other specified postprocedural states: Z98.890

## 2020-01-26 HISTORY — DX: Unspecified osteoarthritis, unspecified site: M19.90

## 2020-01-26 HISTORY — DX: Cardiac murmur, unspecified: R01.1

## 2020-01-26 LAB — URINALYSIS, ROUTINE W REFLEX MICROSCOPIC
Bilirubin Urine: NEGATIVE
Glucose, UA: NEGATIVE mg/dL
Hgb urine dipstick: NEGATIVE
Ketones, ur: 5 mg/dL — AB
Leukocytes,Ua: NEGATIVE
Nitrite: NEGATIVE
Protein, ur: NEGATIVE mg/dL
Specific Gravity, Urine: 1.028 (ref 1.005–1.030)
pH: 5 (ref 5.0–8.0)

## 2020-01-26 LAB — SURGICAL PCR SCREEN
MRSA, PCR: NEGATIVE
Staphylococcus aureus: NEGATIVE

## 2020-01-26 LAB — CBC
HCT: 42.6 % (ref 36.0–46.0)
Hemoglobin: 13.9 g/dL (ref 12.0–15.0)
MCH: 29.9 pg (ref 26.0–34.0)
MCHC: 32.6 g/dL (ref 30.0–36.0)
MCV: 91.6 fL (ref 80.0–100.0)
Platelets: 230 10*3/uL (ref 150–400)
RBC: 4.65 MIL/uL (ref 3.87–5.11)
RDW: 12.4 % (ref 11.5–15.5)
WBC: 7.2 10*3/uL (ref 4.0–10.5)
nRBC: 0 % (ref 0.0–0.2)

## 2020-01-26 LAB — BASIC METABOLIC PANEL
Anion gap: 8 (ref 5–15)
BUN: 18 mg/dL (ref 6–20)
CO2: 26 mmol/L (ref 22–32)
Calcium: 8.9 mg/dL (ref 8.9–10.3)
Chloride: 104 mmol/L (ref 98–111)
Creatinine, Ser: 0.68 mg/dL (ref 0.44–1.00)
GFR calc Af Amer: >60 mL/min (ref >60)
GFR calc non Af Amer: >60 mL/min (ref >60)
Glucose, Bld: 93 mg/dL (ref 70–99)
Potassium: 4.1 mmol/L (ref 3.5–5.1)
Sodium: 138 mmol/L (ref 135–145)

## 2020-01-26 LAB — PROTIME-INR
INR: 1 (ref 0.8–1.2)
Prothrombin Time: 12.8 seconds (ref 11.4–15.2)

## 2020-01-26 LAB — APTT: aPTT: 31 seconds (ref 24–36)

## 2020-01-26 NOTE — Progress Notes (Signed)
[facility] and requested ekg , most recent office visit note and yearly physical office visit.  They are to fax.

## 2020-01-26 NOTE — Progress Notes (Signed)
U/A done 01/26/20 faxed via epic to Dr Tonita Cong.

## 2020-01-27 ENCOUNTER — Ambulatory Visit: Payer: Self-pay | Admitting: Orthopedic Surgery

## 2020-01-27 NOTE — Progress Notes (Signed)
Anesthesia Review:  HTM:BP [facility]  Office visit of 01/24/20 for sinus infection on chart - Pt placed on Omnicef  01/16/20- preop clearance office visit on chart with clearance  Cardiologist : Chest x-ray : EKG :01/16/20 on chart Echo : Stress test: Cardiac Cath :  Activity level: can do a flight of steps without difficulty  Sleep Study/ CPAP : Fasting Blood Sugar :      / Checks Blood Sugar -- times a day:   Blood Thinner/ Instructions /Last Dose: ASA / Instructions/ Last Dose :

## 2020-01-27 NOTE — H&P (View-Only) (Signed)
Jenna Hancock is an 59 y.o. female.   Chief Complaint: L knee pain HPI: Visit For: New patient Location: left; knee Duration: 1 1/2 years Quality: aching; burning; stabbing; sharp Context: NKI Alleviating Factors: narcotics (Hydrocodone) Aggravating Factors: weightbearing Associated Symptoms: no catching/locking; popping/clicking; grinding; instability Previous Surgery: date: (10/2018 left knee arthroscopy Dr. Megan Salon) Previous Injections: The patient states that she had 1 visco injection in May, but it did not help. She did not complete the series. She states that cortisone does not help. Notes: She presents today for evaluation of left knee pain. She reports initial onset approximately year and a half ago, underwent an arthroscopy and was told she had significant arthritic changes. Her pain seemed to be worse after the arthroscopy which was done in May of last year. She had cortisone prior to surgery but none since. She did undergo a Visco supplementation injection earlier this year, approximately 2 months ago with no relief. She is wearing a knee sleeve which does seem to help provide stability. She reports daily swelling. She is on chronic pain medication at this point for the knee, hydrocodone. She has difficulty sleeping at night despite everything that she is doing to treat this. She is interested in proceeding with a total knee replacement and was recommended to see Dr. Tonita Cong by a friend.  Past Medical History:  Diagnosis Date  . Anxiety   . Arthritis   . Depression   . Essential hypertension, benign 12/08/2016  . GERD (gastroesophageal reflux disease)   . Heart murmur    AS A CHILD   . High cholesterol 12/08/2016  . Hyperlipidemia   . Hypertension   . PONV (postoperative nausea and vomiting)   . Sleep apnea    DOES NOT USE CPAP   . Stroke ([place])    HX OF MINI STROKE 2016   . TIA (transient ischemic attack) 2017    Past Surgical History:  Procedure Laterality Date  .  APPENDECTOMY    . JOINT REPLACEMENT     rt knee 11 yrs ago  . KNEE ARTHROSCOPY Left   . NASAL SINUS SURGERY      Family History  Problem Relation Age of Onset  . Diabetes Father   . Stroke Father   . Heart disease Father   . Lung cancer Mother    Social History:  reports that she has been smoking cigarettes. She has a 8.50 pack-year smoking history. She has never used smokeless tobacco. She reports that she does not drink alcohol and does not use drugs.  Allergies:  Allergies  Allergen Reactions  . Bactrim Rash  . Sulfa Antibiotics Rash    (Not in a hospital admission)   Results for orders placed or performed during the hospital encounter of 01/26/20 (from the past 48 hour(s))  APTT     Status: None   Collection Time: 01/26/20 11:06 AM  Result Value Ref Range   aPTT 31 24 - 36 seconds    Comment: Performed at [facility], [place] [address], [place], [place] [address]  Basic metabolic panel     Status: None   Collection Time: 01/26/20 11:06 AM  Result Value Ref Range   Sodium 138 135 - 145 mmol/L   Potassium 4.1 3.5 - 5.1 mmol/L   Chloride 104 98 - 111 mmol/L   CO2 26 22 - 32 mmol/L   Glucose, Bld 93 70 - 99 mg/dL    Comment: Glucose reference range applies only to samples taken after  fasting for at least 8 hours.   BUN 18 6 - 20 mg/dL   Creatinine, Ser 0.68 0.44 - 1.00 mg/dL   Calcium 8.9 8.9 - 10.3 mg/dL   GFR calc non Af Amer >60 >60 mL/min   GFR calc Af Amer >60 >60 mL/min   Anion gap 8 5 - 15    Comment: Performed at [facility], [place] [address] [place], [place] [address]  CBC     Status: None   Collection Time: 01/26/20 11:06 AM  Result Value Ref Range   WBC 7.2 4.0 - 10.5 K/uL   RBC 4.65 3.87 - 5.11 MIL/uL   Hemoglobin 13.9 12.0 - 15.0 g/dL   HCT 42.6 36 - 46 %   MCV 91.6 80.0 - 100.0 fL   MCH 29.9 26.0 - 34.0 pg   MCHC 32.6 30.0 - 36.0 g/dL   RDW 12.4 11.5 - 15.5 %   Platelets 230 150 - 400 K/uL   nRBC 0.0 0.0 -  0.2 %    Comment: Performed at [facility], [place] [address] [place], [place] [address]  Protime-INR     Status: None   Collection Time: 01/26/20 11:06 AM  Result Value Ref Range   Prothrombin Time 12.8 11.4 - 15.2 seconds   INR 1.0 0.8 - 1.2    Comment: (NOTE) INR goal varies based on device and disease states. Performed at [facility], [place] [address] [place], [place] [address]   Urinalysis, Routine w reflex microscopic     Status: Abnormal   Collection Time: 01/26/20 11:06 AM  Result Value Ref Range   Color, Urine YELLOW YELLOW   APPearance CLEAR CLEAR   Specific Gravity, Urine 1.028 1.005 - 1.030   pH 5.0 5.0 - 8.0   Glucose, UA NEGATIVE NEGATIVE mg/dL   Hgb urine dipstick NEGATIVE NEGATIVE   Bilirubin Urine NEGATIVE NEGATIVE   Ketones, ur 5 (A) NEGATIVE mg/dL   Protein, ur NEGATIVE NEGATIVE mg/dL   Nitrite NEGATIVE NEGATIVE   Leukocytes,Ua NEGATIVE NEGATIVE    Comment: Performed at [facility] [address] [place], [place] [address]  Surgical pcr screen     Status: None   Collection Time: 01/26/20 11:06 AM   Specimen: Nasal Mucosa; Nasal Swab  Result Value Ref Range   MRSA, PCR NEGATIVE NEGATIVE   Staphylococcus aureus NEGATIVE NEGATIVE    Comment: (NOTE) The Xpert SA Assay (FDA approved for NASAL specimens in patients 82 years of age and older), is one component of a comprehensive surveillance program. It is not intended to diagnose infection nor to guide or monitor treatment. Performed at [facility], [place] [address], [place], [place] [address]    No results found.  Review of Systems  Musculoskeletal: Positive for arthralgias and joint swelling.  All other systems reviewed and are negative.   There were no vitals taken for this visit. Physical Exam Constitutional:      Appearance: Normal appearance.  HENT:     Head: Normocephalic.     Right Ear: External ear normal.      Left Ear: External ear normal.     Nose: Nose normal.     Mouth/Throat:     Pharynx: Oropharynx is clear.  Cardiovascular:     Rate and Rhythm: Normal rate and regular rhythm.     Pulses: Normal pulses.     Heart sounds: Normal heart sounds.  Pulmonary:     Effort: Pulmonary effort  is normal.     Breath sounds: Normal breath sounds.  Abdominal:     General: Bowel sounds are normal.     Palpations: Abdomen is soft.  Musculoskeletal:     Cervical back: Normal range of motion and neck supple.     Comments: On examination of the left knee, tender on palpation of the medial joint line. Nontender lateral joint line, patellar tendon, quadriceps tendon, patella, peroneal nerve and popliteal space. No calf pain or sign of DVT. No pain or laxity with varus or valgus stress. No instability noted. Painful medial McMurray's. Trace effusion noted. Range of motion approximately -5-110. Positive patellofemoral crepitus. No patellofemoral pain on compression. Sensation intact distally. X-rays ordered, obtained, reviewed today with no fracture, subluxation, dislocation, lytic or blastic lesions. Left knee with end-stage degenerative changes bone-on-bone medial compartment, varus deformity. Right knee she is status post a medial unicompartmental replacement which appears stable.  Skin:    General: Skin is warm and dry.  Neurological:     Mental Status: She is alert.      Assessment/Plan End-stage left knee osteoarthritis refractory to conservative treatment including Visco supplementation, cortisone injections, knee arthroscopy with worsening pain postoperatively, bracing, quad strengthening, home exercise program, narcotic medication  Plan:Discussed relevant anatomy and etiology of symptoms. Demonstrated quad strengthening exercises. Recommend arch supports and avoiding walking barefoot. Discussed activity modifications to avoid exacerbations. We discussed anti-inflammatories, ice and elevation, as needed  for swelling, pain or exacerbations. We discussed options. Given that she has failed conservative treatment as listed above we discussed proceeding with a total knee replacement on the left. She is otherwise healthy but we will obtain preoperative clearance from her PCP. She has no history of DVT or MRSA. She is able to tolerate aspirin and has been cleared to hold it accordingly for surgery. Since she has been on hydrocodone for this we will likely plan to use Percocet postoperatively for better pain control for her. We will start the scheduling process after we obtain clearance from her PCP.   Plan left total knee arthroplasty  Cecilie Kicks, PA-C for Dr. Tonita Cong 01/27/2020, 3:49 PM

## 2020-01-27 NOTE — H&P (Signed)
Jenna Hancock is an 59 y.o. female.   Chief Complaint: L knee pain HPI: Visit For: New patient Location: left; knee Duration: 1 1/2 years Quality: aching; burning; stabbing; sharp Context: NKI Alleviating Factors: narcotics (Hydrocodone) Aggravating Factors: weightbearing Associated Symptoms: no catching/locking; popping/clicking; grinding; instability Previous Surgery: date: (10/2018 left knee arthroscopy Dr. Megan Salon) Previous Injections: The patient states that she had 1 visco injection in May, but it did not help. She did not complete the series. She states that cortisone does not help. Notes: She presents today for evaluation of left knee pain. She reports initial onset approximately year and a half ago, underwent an arthroscopy and was told she had significant arthritic changes. Her pain seemed to be worse after the arthroscopy which was done in May of last year. She had cortisone prior to surgery but none since. She did undergo a Visco supplementation injection earlier this year, approximately 2 months ago with no relief. She is wearing a knee sleeve which does seem to help provide stability. She reports daily swelling. She is on chronic pain medication at this point for the knee, hydrocodone. She has difficulty sleeping at night despite everything that she is doing to treat this. She is interested in proceeding with a total knee replacement and was recommended to see Dr. Tonita Cong by a friend.  Past Medical History:  Diagnosis Date  . Anxiety   . Arthritis   . Depression   . Essential hypertension, benign 12/08/2016  . GERD (gastroesophageal reflux disease)   . Heart murmur    AS A CHILD   . High cholesterol 12/08/2016  . Hyperlipidemia   . Hypertension   . PONV (postoperative nausea and vomiting)   . Sleep apnea    DOES NOT USE CPAP   . Stroke ([place])    HX OF MINI STROKE 2016   . TIA (transient ischemic attack) 2017    Past Surgical History:  Procedure Laterality Date  .  APPENDECTOMY    . JOINT REPLACEMENT     rt knee 11 yrs ago  . KNEE ARTHROSCOPY Left   . NASAL SINUS SURGERY      Family History  Problem Relation Age of Onset  . Diabetes Father   . Stroke Father   . Heart disease Father   . Lung cancer Mother    Social History:  reports that she has been smoking cigarettes. She has a 8.50 pack-year smoking history. She has never used smokeless tobacco. She reports that she does not drink alcohol and does not use drugs.  Allergies:  Allergies  Allergen Reactions  . Bactrim Rash  . Sulfa Antibiotics Rash    (Not in a hospital admission)   Results for orders placed or performed during the hospital encounter of 01/26/20 (from the past 48 hour(s))  APTT     Status: None   Collection Time: 01/26/20 11:06 AM  Result Value Ref Range   aPTT 31 24 - 36 seconds    Comment: Performed at [facility], [place] [address] [place], [place] [address]  Basic metabolic panel     Status: None   Collection Time: 01/26/20 11:06 AM  Result Value Ref Range   Sodium 138 135 - 145 mmol/L   Potassium 4.1 3.5 - 5.1 mmol/L   Chloride 104 98 - 111 mmol/L   CO2 26 22 - 32 mmol/L   Glucose, Bld 93 70 - 99 mg/dL    Comment: Glucose reference range applies only to samples taken after  fasting for at least 8 hours.   BUN 18 6 - 20 mg/dL   Creatinine, Ser 0.68 0.44 - 1.00 mg/dL   Calcium 8.9 8.9 - 10.3 mg/dL   GFR calc non Af Amer >60 >60 mL/min   GFR calc Af Amer >60 >60 mL/min   Anion gap 8 5 - 15    Comment: Performed at [facility], [place] [address] [place], [place] [address]  CBC     Status: None   Collection Time: 01/26/20 11:06 AM  Result Value Ref Range   WBC 7.2 4.0 - 10.5 K/uL   RBC 4.65 3.87 - 5.11 MIL/uL   Hemoglobin 13.9 12.0 - 15.0 g/dL   HCT 42.6 36 - 46 %   MCV 91.6 80.0 - 100.0 fL   MCH 29.9 26.0 - 34.0 pg   MCHC 32.6 30.0 - 36.0 g/dL   RDW 12.4 11.5 - 15.5 %   Platelets 230 150 - 400 K/uL   nRBC 0.0 0.0 -  0.2 %    Comment: Performed at [facility], [place] [address], [place], [place] [address]  Protime-INR     Status: None   Collection Time: 01/26/20 11:06 AM  Result Value Ref Range   Prothrombin Time 12.8 11.4 - 15.2 seconds   INR 1.0 0.8 - 1.2    Comment: (NOTE) INR goal varies based on device and disease states. Performed at [facility], [place] [address] [place], [place] [address]   Urinalysis, Routine w reflex microscopic     Status: Abnormal   Collection Time: 01/26/20 11:06 AM  Result Value Ref Range   Color, Urine YELLOW YELLOW   APPearance CLEAR CLEAR   Specific Gravity, Urine 1.028 1.005 - 1.030   pH 5.0 5.0 - 8.0   Glucose, UA NEGATIVE NEGATIVE mg/dL   Hgb urine dipstick NEGATIVE NEGATIVE   Bilirubin Urine NEGATIVE NEGATIVE   Ketones, ur 5 (A) NEGATIVE mg/dL   Protein, ur NEGATIVE NEGATIVE mg/dL   Nitrite NEGATIVE NEGATIVE   Leukocytes,Ua NEGATIVE NEGATIVE    Comment: Performed at [place] [address], [place], [place] [address]  Surgical pcr screen     Status: None   Collection Time: 01/26/20 11:06 AM   Specimen: Nasal Mucosa; Nasal Swab  Result Value Ref Range   MRSA, PCR NEGATIVE NEGATIVE   Staphylococcus aureus NEGATIVE NEGATIVE    Comment: (NOTE) The Xpert SA Assay (FDA approved for NASAL specimens in patients 2 years of age and older), is one component of a comprehensive surveillance program. It is not intended to diagnose infection nor to guide or monitor treatment. Performed at [facility], [place] [address], [place],  [address]    No results found.  Review of Systems  Musculoskeletal: Positive for arthralgias and joint swelling.  All other systems reviewed and are negative.   There were no vitals taken for this visit. Physical Exam Constitutional:      Appearance: Normal appearance.  HENT:     Head: Normocephalic.     Right Ear: External ear normal.      Left Ear: External ear normal.     Nose: Nose normal.     Mouth/Throat:     Pharynx: Oropharynx is clear.  Cardiovascular:     Rate and Rhythm: Normal rate and regular rhythm.     Pulses: Normal pulses.     Heart sounds: Normal heart sounds.  Pulmonary:     Effort: Pulmonary effort  is normal.     Breath sounds: Normal breath sounds.  Abdominal:     General: Bowel sounds are normal.     Palpations: Abdomen is soft.  Musculoskeletal:     Cervical back: Normal range of motion and neck supple.     Comments: On examination of the left knee, tender on palpation of the medial joint line. Nontender lateral joint line, patellar tendon, quadriceps tendon, patella, peroneal nerve and popliteal space. No calf pain or sign of DVT. No pain or laxity with varus or valgus stress. No instability noted. Painful medial McMurray's. Trace effusion noted. Range of motion approximately -5-110. Positive patellofemoral crepitus. No patellofemoral pain on compression. Sensation intact distally. X-rays ordered, obtained, reviewed today with no fracture, subluxation, dislocation, lytic or blastic lesions. Left knee with end-stage degenerative changes bone-on-bone medial compartment, varus deformity. Right knee she is status post a medial unicompartmental replacement which appears stable.  Skin:    General: Skin is warm and dry.  Neurological:     Mental Status: She is alert.      Assessment/Plan End-stage left knee osteoarthritis refractory to conservative treatment including Visco supplementation, cortisone injections, knee arthroscopy with worsening pain postoperatively, bracing, quad strengthening, home exercise program, narcotic medication  Plan:Discussed relevant anatomy and etiology of symptoms. Demonstrated quad strengthening exercises. Recommend arch supports and avoiding walking barefoot. Discussed activity modifications to avoid exacerbations. We discussed anti-inflammatories, ice and elevation, as needed  for swelling, pain or exacerbations. We discussed options. Given that she has failed conservative treatment as listed above we discussed proceeding with a total knee replacement on the left. She is otherwise healthy but we will obtain preoperative clearance from her PCP. She has no history of DVT or MRSA. She is able to tolerate aspirin and has been cleared to hold it accordingly for surgery. Since she has been on hydrocodone for this we will likely plan to use Percocet postoperatively for better pain control for her. We will start the scheduling process after we obtain clearance from her PCP.   Plan left total knee arthroplasty  Cecilie Kicks, PA-C for Dr. Tonita Cong 01/27/2020, 3:49 PM

## 2020-01-28 ENCOUNTER — Other Ambulatory Visit (HOSPITAL_COMMUNITY)
Admission: RE | Admit: 2020-01-28 | Discharge: 2020-01-28 | Disposition: A | Discharge status: Home / Self Care | Payer: Medicare PPO | Source: Ambulatory Visit | Attending: Specialist | Admitting: Specialist

## 2020-01-28 DIAGNOSIS — Z01812 Encounter for preprocedural laboratory examination: Secondary | ICD-10-CM | POA: Insufficient documentation

## 2020-01-28 DIAGNOSIS — Z20822 Contact with and (suspected) exposure to covid-19: Secondary | ICD-10-CM | POA: Insufficient documentation

## 2020-01-28 LAB — SARS CORONAVIRUS 2 (TAT 6-24 HRS): SARS Coronavirus 2: NEGATIVE

## 2020-01-31 ENCOUNTER — Encounter (HOSPITAL_COMMUNITY): Payer: Self-pay | Admitting: Specialist

## 2020-02-01 ENCOUNTER — Encounter (HOSPITAL_COMMUNITY): Payer: Self-pay | Admitting: Specialist

## 2020-02-01 ENCOUNTER — Inpatient Hospital Stay (HOSPITAL_COMMUNITY): Payer: Medicare PPO

## 2020-02-01 ENCOUNTER — Other Ambulatory Visit: Payer: Self-pay

## 2020-02-01 ENCOUNTER — Inpatient Hospital Stay (HOSPITAL_COMMUNITY)
Admission: RE | Admit: 2020-02-01 | Discharge: 2020-02-03 | DRG: 470 | Disposition: A | Discharge status: Home Health | Payer: Medicare PPO | Attending: Specialist | Admitting: Specialist

## 2020-02-01 ENCOUNTER — Ambulatory Visit (HOSPITAL_COMMUNITY): Payer: Medicare PPO | Admitting: Anesthesiology

## 2020-02-01 ENCOUNTER — Ambulatory Visit (HOSPITAL_COMMUNITY): Payer: Medicare PPO | Admitting: Physician Assistant

## 2020-02-01 ENCOUNTER — Encounter (HOSPITAL_COMMUNITY)
Admission: RE | Disposition: A | Discharge status: Home Health | Payer: Self-pay | Source: Home / Self Care | Attending: Specialist

## 2020-02-01 DIAGNOSIS — G4730 Sleep apnea, unspecified: Secondary | ICD-10-CM | POA: Diagnosis present

## 2020-02-01 DIAGNOSIS — K219 Gastro-esophageal reflux disease without esophagitis: Secondary | ICD-10-CM | POA: Diagnosis present

## 2020-02-01 DIAGNOSIS — Z801 Family history of malignant neoplasm of trachea, bronchus and lung: Secondary | ICD-10-CM

## 2020-02-01 DIAGNOSIS — Z20822 Contact with and (suspected) exposure to covid-19: Secondary | ICD-10-CM | POA: Diagnosis present

## 2020-02-01 DIAGNOSIS — Z96659 Presence of unspecified artificial knee joint: Secondary | ICD-10-CM

## 2020-02-01 DIAGNOSIS — Z8673 Personal history of transient ischemic attack (TIA), and cerebral infarction without residual deficits: Secondary | ICD-10-CM

## 2020-02-01 DIAGNOSIS — E785 Hyperlipidemia, unspecified: Secondary | ICD-10-CM | POA: Diagnosis present

## 2020-02-01 DIAGNOSIS — M1712 Unilateral primary osteoarthritis, left knee: Principal | ICD-10-CM | POA: Diagnosis present

## 2020-02-01 DIAGNOSIS — Z823 Family history of stroke: Secondary | ICD-10-CM

## 2020-02-01 DIAGNOSIS — F17210 Nicotine dependence, cigarettes, uncomplicated: Secondary | ICD-10-CM | POA: Diagnosis present

## 2020-02-01 DIAGNOSIS — F418 Other specified anxiety disorders: Secondary | ICD-10-CM | POA: Diagnosis present

## 2020-02-01 DIAGNOSIS — Z8249 Family history of ischemic heart disease and other diseases of the circulatory system: Secondary | ICD-10-CM

## 2020-02-01 DIAGNOSIS — I10 Essential (primary) hypertension: Secondary | ICD-10-CM | POA: Diagnosis present

## 2020-02-01 DIAGNOSIS — Z833 Family history of diabetes mellitus: Secondary | ICD-10-CM

## 2020-02-01 HISTORY — PX: TOTAL KNEE ARTHROPLASTY: SHX125

## 2020-02-01 SURGERY — ARTHROPLASTY, KNEE, TOTAL
Anesthesia: Regional | Site: Knee | Laterality: Left

## 2020-02-01 MED ORDER — CHLORHEXIDINE GLUCONATE 0.12 % MT SOLN
15.0000 mL | Freq: Once | OROMUCOSAL | Status: AC
  Administered 2020-02-01: 15 mL via OROMUCOSAL

## 2020-02-01 MED ORDER — CEFAZOLIN SODIUM-DEXTROSE 2-4 GM/100ML-% IV SOLN
2.0000 g | Freq: Four times a day (QID) | INTRAVENOUS | Status: AC
  Administered 2020-02-01 – 2020-02-02 (×3): 2 g via INTRAVENOUS
  Filled 2020-02-01 (×3): qty 100

## 2020-02-01 MED ORDER — RISAQUAD PO CAPS
1.0000 | ORAL_CAPSULE | Freq: Every day | ORAL | Status: DC
  Administered 2020-02-01 – 2020-02-03 (×3): 1 via ORAL
  Filled 2020-02-01 (×3): qty 1

## 2020-02-01 MED ORDER — LIDOCAINE 2% (20 MG/ML) 5 ML SYRINGE
INTRAMUSCULAR | Status: DC | PRN
  Administered 2020-02-01: 100 mg via INTRAVENOUS

## 2020-02-01 MED ORDER — OXYCODONE HCL 5 MG PO TABS: 5.0000 mg | ORAL_TABLET | Freq: Once | ORAL | Status: DC | PRN

## 2020-02-01 MED ORDER — DIPHENHYDRAMINE HCL 12.5 MG/5ML PO ELIX: 12.5000 mg | ORAL_SOLUTION | ORAL | Status: DC | PRN

## 2020-02-01 MED ORDER — FLUOXETINE HCL 40 MG PO CAPS: 40.0000 mg | ORAL_CAPSULE | Freq: Every day | ORAL | Status: DC

## 2020-02-01 MED ORDER — OXYCODONE HCL 5 MG PO TABS
5.0000 mg | ORAL_TABLET | ORAL | Status: DC | PRN
  Administered 2020-02-03 (×2): 10 mg via ORAL
  Filled 2020-02-01: qty 1
  Filled 2020-02-01 (×3): qty 2

## 2020-02-01 MED ORDER — PROMETHAZINE HCL 25 MG/ML IJ SOLN: 6.2500 mg | INTRAMUSCULAR | Status: DC | PRN

## 2020-02-01 MED ORDER — PROPOFOL 500 MG/50ML IV EMUL
INTRAVENOUS | Status: AC
  Filled 2020-02-01: qty 50

## 2020-02-01 MED ORDER — ACETAMINOPHEN 500 MG PO TABS
1000.0000 mg | ORAL_TABLET | Freq: Four times a day (QID) | ORAL | Status: AC
  Administered 2020-02-01 – 2020-02-02 (×4): 1000 mg via ORAL
  Filled 2020-02-01 (×4): qty 2

## 2020-02-01 MED ORDER — DEXMEDETOMIDINE (PRECEDEX) IN NS 20 MCG/5ML (4 MCG/ML) IV SYRINGE
PREFILLED_SYRINGE | INTRAVENOUS | Status: DC | PRN
  Administered 2020-02-01 (×4): 4 ug via INTRAVENOUS

## 2020-02-01 MED ORDER — ACETAMINOPHEN 10 MG/ML IV SOLN
1000.0000 mg | INTRAVENOUS | Status: AC
  Administered 2020-02-01: 1000 mg via INTRAVENOUS
  Filled 2020-02-01: qty 100

## 2020-02-01 MED ORDER — DEXAMETHASONE SODIUM PHOSPHATE 10 MG/ML IJ SOLN
INTRAMUSCULAR | Status: DC | PRN
  Administered 2020-02-01: 4 mg via INTRAVENOUS

## 2020-02-01 MED ORDER — METHOCARBAMOL 500 MG IVPB - SIMPLE MED
500.0000 mg | Freq: Four times a day (QID) | INTRAVENOUS | Status: DC | PRN
  Filled 2020-02-01: qty 50

## 2020-02-01 MED ORDER — FENTANYL CITRATE (PF) 100 MCG/2ML IJ SOLN
INTRAMUSCULAR | Status: DC | PRN
  Administered 2020-02-01: 50 ug via INTRAVENOUS
  Administered 2020-02-01 (×2): 25 ug via INTRAVENOUS

## 2020-02-01 MED ORDER — METOPROLOL TARTRATE 25 MG PO TABS
25.0000 mg | ORAL_TABLET | Freq: Every evening | ORAL | Status: DC
  Administered 2020-02-01 – 2020-02-02 (×2): 25 mg via ORAL
  Filled 2020-02-01 (×2): qty 1

## 2020-02-01 MED ORDER — PHENOL 1.4 % MT LIQD: 1.0000 | OROMUCOSAL | Status: DC | PRN

## 2020-02-01 MED ORDER — METHOCARBAMOL 500 MG PO TABS
500.0000 mg | ORAL_TABLET | Freq: Four times a day (QID) | ORAL | Status: DC | PRN
  Administered 2020-02-01 – 2020-02-03 (×6): 500 mg via ORAL
  Filled 2020-02-01 (×6): qty 1

## 2020-02-01 MED ORDER — OXYCODONE HCL 5 MG PO TABS
10.0000 mg | ORAL_TABLET | ORAL | Status: DC | PRN
  Administered 2020-02-01 (×2): 10 mg via ORAL
  Administered 2020-02-02 (×5): 15 mg via ORAL
  Administered 2020-02-03: 10 mg via ORAL
  Filled 2020-02-01 (×4): qty 3
  Filled 2020-02-01 (×3): qty 2

## 2020-02-01 MED ORDER — BUPIVACAINE-EPINEPHRINE 0.5% -1:200000 IJ SOLN
INTRAMUSCULAR | Status: DC | PRN
  Administered 2020-02-01: 60 mL

## 2020-02-01 MED ORDER — ORAL CARE MOUTH RINSE: 15.0000 mL | Freq: Once | OROMUCOSAL | Status: AC

## 2020-02-01 MED ORDER — PROPOFOL 10 MG/ML IV BOLUS
INTRAVENOUS | Status: AC
  Filled 2020-02-01: qty 40

## 2020-02-01 MED ORDER — ONDANSETRON HCL 4 MG/2ML IJ SOLN
INTRAMUSCULAR | Status: DC | PRN
  Administered 2020-02-01: 4 mg via INTRAVENOUS

## 2020-02-01 MED ORDER — HYDROCHLOROTHIAZIDE 12.5 MG PO CAPS
12.5000 mg | ORAL_CAPSULE | Freq: Every day | ORAL | Status: DC
  Administered 2020-02-02 – 2020-02-03 (×2): 12.5 mg via ORAL
  Filled 2020-02-01 (×2): qty 1

## 2020-02-01 MED ORDER — PROPOFOL 10 MG/ML IV BOLUS
INTRAVENOUS | Status: DC | PRN
  Administered 2020-02-01: 50 mg via INTRAVENOUS
  Administered 2020-02-01: 75 ug/kg/min via INTRAVENOUS

## 2020-02-01 MED ORDER — ALBUTEROL SULFATE HFA 108 (90 BASE) MCG/ACT IN AERS: 1.0000 | INHALATION_SPRAY | Freq: Four times a day (QID) | RESPIRATORY_TRACT | Status: DC | PRN

## 2020-02-01 MED ORDER — ROPIVACAINE HCL 5 MG/ML IJ SOLN
INTRAMUSCULAR | Status: DC | PRN
  Administered 2020-02-01: 20 mL via PERINEURAL

## 2020-02-01 MED ORDER — HYDROMORPHONE HCL 1 MG/ML IJ SOLN: 0.2500 mg | INTRAMUSCULAR | Status: DC | PRN

## 2020-02-01 MED ORDER — ONDANSETRON HCL 4 MG PO TABS
4.0000 mg | ORAL_TABLET | Freq: Four times a day (QID) | ORAL | Status: DC | PRN
  Administered 2020-02-02: 4 mg via ORAL
  Filled 2020-02-01: qty 1

## 2020-02-01 MED ORDER — BUPROPION HCL ER (SR) 150 MG PO TB12
150.0000 mg | ORAL_TABLET | Freq: Two times a day (BID) | ORAL | Status: DC
  Administered 2020-02-01 – 2020-02-03 (×4): 150 mg via ORAL
  Filled 2020-02-01 (×4): qty 1

## 2020-02-01 MED ORDER — ONDANSETRON HCL 4 MG/2ML IJ SOLN: 4.0000 mg | Freq: Four times a day (QID) | INTRAMUSCULAR | Status: DC | PRN

## 2020-02-01 MED ORDER — LIDOCAINE 2% (20 MG/ML) 5 ML SYRINGE
INTRAMUSCULAR | Status: AC
  Filled 2020-02-01: qty 10

## 2020-02-01 MED ORDER — CEFAZOLIN SODIUM-DEXTROSE 2-4 GM/100ML-% IV SOLN
2.0000 g | INTRAVENOUS | Status: AC
  Administered 2020-02-01: 2 g via INTRAVENOUS
  Filled 2020-02-01: qty 100

## 2020-02-01 MED ORDER — IRBESARTAN 150 MG PO TABS
300.0000 mg | ORAL_TABLET | Freq: Every day | ORAL | Status: DC
  Administered 2020-02-02 – 2020-02-03 (×2): 300 mg via ORAL
  Filled 2020-02-01 (×2): qty 2

## 2020-02-01 MED ORDER — ACETAMINOPHEN 325 MG PO TABS
325.0000 mg | ORAL_TABLET | Freq: Four times a day (QID) | ORAL | Status: DC | PRN
  Administered 2020-02-02 – 2020-02-03 (×2): 650 mg via ORAL
  Filled 2020-02-01 (×2): qty 2

## 2020-02-01 MED ORDER — BUPIVACAINE IN DEXTROSE 0.75-8.25 % IT SOLN
INTRATHECAL | Status: DC | PRN
Start: 2020-02-01 — End: 2020-02-01
  Administered 2020-02-01: 1.8 mL via INTRATHECAL

## 2020-02-01 MED ORDER — TRANEXAMIC ACID-NACL 1000-0.7 MG/100ML-% IV SOLN
1000.0000 mg | INTRAVENOUS | Status: AC
  Administered 2020-02-01: 1000 mg via INTRAVENOUS
  Filled 2020-02-01: qty 100

## 2020-02-01 MED ORDER — BISACODYL 5 MG PO TBEC: 5.0000 mg | DELAYED_RELEASE_TABLET | Freq: Every day | ORAL | Status: DC | PRN

## 2020-02-01 MED ORDER — MIDAZOLAM HCL 2 MG/2ML IJ SOLN
INTRAMUSCULAR | Status: AC
  Filled 2020-02-01: qty 2

## 2020-02-01 MED ORDER — POLYETHYLENE GLYCOL 3350 17 G PO PACK
17.0000 g | PACK | Freq: Every day | ORAL | Status: DC
  Administered 2020-02-02 – 2020-02-03 (×2): 17 g via ORAL
  Filled 2020-02-01 (×3): qty 1

## 2020-02-01 MED ORDER — FLUOXETINE HCL 20 MG PO CAPS
40.0000 mg | ORAL_CAPSULE | Freq: Every day | ORAL | Status: DC
  Administered 2020-02-02 – 2020-02-03 (×2): 40 mg via ORAL
  Filled 2020-02-01 (×2): qty 2

## 2020-02-01 MED ORDER — DOCUSATE SODIUM 100 MG PO CAPS
100.0000 mg | ORAL_CAPSULE | Freq: Two times a day (BID) | ORAL | Status: DC
  Administered 2020-02-01 – 2020-02-03 (×4): 100 mg via ORAL
  Filled 2020-02-01 (×4): qty 1

## 2020-02-01 MED ORDER — OXYCODONE HCL 5 MG/5ML PO SOLN: 5.0000 mg | Freq: Once | ORAL | Status: DC | PRN

## 2020-02-01 MED ORDER — LACTATED RINGERS IV SOLN: INTRAVENOUS | Status: DC

## 2020-02-01 MED ORDER — FENTANYL CITRATE (PF) 100 MCG/2ML IJ SOLN
INTRAMUSCULAR | Status: AC
  Filled 2020-02-01: qty 2

## 2020-02-01 MED ORDER — PHENYLEPHRINE HCL-NACL 10-0.9 MG/250ML-% IV SOLN
INTRAVENOUS | Status: DC | PRN
  Administered 2020-02-01: 25 ug/min via INTRAVENOUS

## 2020-02-01 MED ORDER — MOMETASONE FURO-FORMOTEROL FUM 100-5 MCG/ACT IN AERO
2.0000 | INHALATION_SPRAY | Freq: Two times a day (BID) | RESPIRATORY_TRACT | Status: DC
  Administered 2020-02-01 – 2020-02-03 (×4): 2 via RESPIRATORY_TRACT
  Filled 2020-02-01: qty 8.8

## 2020-02-01 MED ORDER — BUPIVACAINE-EPINEPHRINE (PF) 0.5% -1:200000 IJ SOLN
INTRAMUSCULAR | Status: AC
  Filled 2020-02-01: qty 60

## 2020-02-01 MED ORDER — METOCLOPRAMIDE HCL 5 MG PO TABS: 5.0000 mg | ORAL_TABLET | Freq: Three times a day (TID) | ORAL | Status: DC | PRN

## 2020-02-01 MED ORDER — FOLIC ACID 1 MG PO TABS
0.5000 mg | ORAL_TABLET | Freq: Every day | ORAL | Status: DC
  Administered 2020-02-02 – 2020-02-03 (×2): 0.5 mg via ORAL
  Filled 2020-02-01 (×2): qty 1

## 2020-02-01 MED ORDER — ALPRAZOLAM 0.5 MG PO TABS
0.5000 mg | ORAL_TABLET | Freq: Two times a day (BID) | ORAL | Status: DC | PRN
  Administered 2020-02-02 (×2): 0.5 mg via ORAL
  Filled 2020-02-01 (×2): qty 1

## 2020-02-01 MED ORDER — METOCLOPRAMIDE HCL 5 MG/ML IJ SOLN: 5.0000 mg | Freq: Three times a day (TID) | INTRAMUSCULAR | Status: DC | PRN

## 2020-02-01 MED ORDER — SODIUM CHLORIDE 0.9 % IV SOLN
INTRAVENOUS | Status: AC
  Filled 2020-02-01: qty 500000

## 2020-02-01 MED ORDER — SODIUM CHLORIDE 0.9 % IV SOLN
INTRAVENOUS | Status: DC | PRN
  Administered 2020-02-01: 500 mL

## 2020-02-01 MED ORDER — IRBESARTAN-HYDROCHLOROTHIAZIDE 300-12.5 MG PO TABS: 1.0000 | ORAL_TABLET | Freq: Every day | ORAL | Status: DC

## 2020-02-01 MED ORDER — EZETIMIBE 10 MG PO TABS
10.0000 mg | ORAL_TABLET | Freq: Every evening | ORAL | Status: DC
  Administered 2020-02-01 – 2020-02-02 (×2): 10 mg via ORAL
  Filled 2020-02-01 (×2): qty 1

## 2020-02-01 MED ORDER — BUPIVACAINE HCL 0.25 % IJ SOLN
INTRAMUSCULAR | Status: AC
  Filled 2020-02-01: qty 1

## 2020-02-01 MED ORDER — FOLIC ACID 400 MCG PO TABS: 400.0000 ug | ORAL_TABLET | Freq: Every day | ORAL | Status: DC

## 2020-02-01 MED ORDER — PANTOPRAZOLE SODIUM 40 MG PO TBEC
40.0000 mg | DELAYED_RELEASE_TABLET | Freq: Every day | ORAL | Status: DC
  Administered 2020-02-02 – 2020-02-03 (×2): 40 mg via ORAL
  Filled 2020-02-01 (×2): qty 1

## 2020-02-01 MED ORDER — PHENYLEPHRINE HCL-NACL 10-0.9 MG/250ML-% IV SOLN
INTRAVENOUS | Status: AC
  Filled 2020-02-01: qty 500

## 2020-02-01 MED ORDER — SODIUM CHLORIDE 0.9 % IR SOLN
Status: DC | PRN
  Administered 2020-02-01: 3000 mL

## 2020-02-01 MED ORDER — MIDAZOLAM HCL 5 MG/5ML IJ SOLN
INTRAMUSCULAR | Status: DC | PRN
  Administered 2020-02-01: 2 mg via INTRAVENOUS

## 2020-02-01 MED ORDER — DEXMEDETOMIDINE (PRECEDEX) IN NS 20 MCG/5ML (4 MCG/ML) IV SYRINGE
PREFILLED_SYRINGE | INTRAVENOUS | Status: AC
  Filled 2020-02-01: qty 5

## 2020-02-01 MED ORDER — MENTHOL 3 MG MT LOZG: 1.0000 | LOZENGE | OROMUCOSAL | Status: DC | PRN

## 2020-02-01 MED ORDER — ASPIRIN EC 325 MG PO TBEC
325.0000 mg | DELAYED_RELEASE_TABLET | Freq: Every day | ORAL | Status: DC
  Administered 2020-02-02 – 2020-02-03 (×2): 325 mg via ORAL
  Filled 2020-02-01 (×2): qty 1

## 2020-02-01 MED ORDER — ALUM & MAG HYDROXIDE-SIMETH 200-200-20 MG/5ML PO SUSP: 30.0000 mL | ORAL | Status: DC | PRN

## 2020-02-01 MED ORDER — HYDROMORPHONE HCL 1 MG/ML IJ SOLN
0.5000 mg | INTRAMUSCULAR | Status: DC | PRN
  Administered 2020-02-01 – 2020-02-02 (×3): 0.5 mg via INTRAVENOUS
  Administered 2020-02-02 (×3): 1 mg via INTRAVENOUS
  Filled 2020-02-01 (×5): qty 1

## 2020-02-01 MED ORDER — ALBUTEROL SULFATE (2.5 MG/3ML) 0.083% IN NEBU: 2.5000 mg | INHALATION_SOLUTION | Freq: Four times a day (QID) | RESPIRATORY_TRACT | Status: DC | PRN

## 2020-02-01 MED ORDER — MAGNESIUM CITRATE PO SOLN: 1.0000 | Freq: Once | ORAL | Status: DC | PRN

## 2020-02-01 MED ORDER — KCL IN DEXTROSE-NACL 20-5-0.45 MEQ/L-%-% IV SOLN
INTRAVENOUS | Status: AC
  Filled 2020-02-01 (×2): qty 1000

## 2020-02-01 SURGICAL SUPPLY — 83 items
ATTUNE PS FEM LT SZ 7 CEM KNEE (Femur) ×3 IMPLANT
ATTUNE PSRP INSR SZ7 5 KNEE (Insert) ×2 IMPLANT
ATTUNE PSRP INSR SZ7 5MM KNEE (Insert) ×1 IMPLANT
BAG DECANTER FOR FLEXI CONT (MISCELLANEOUS) ×3 IMPLANT
BAG ZIPLOCK 12X15 (MISCELLANEOUS) IMPLANT
BASE TIBIAL ROT PLAT SZ 5 KNEE (Knees) ×1 IMPLANT
BLADE SAG 18X100X1.27 (BLADE) ×3 IMPLANT
BLADE SAW SGTL 11.0X1.19X90.0M (BLADE) ×3 IMPLANT
BLADE SAW SGTL 13.0X1.19X90.0M (BLADE) ×3 IMPLANT
BLADE SURG SZ10 CARB STEEL (BLADE) ×6 IMPLANT
BNDG COHESIVE 4X5 TAN STRL (GAUZE/BANDAGES/DRESSINGS) ×3 IMPLANT
BNDG ELASTIC 4X5.8 VLCR STR LF (GAUZE/BANDAGES/DRESSINGS) ×3 IMPLANT
BNDG ELASTIC 6X5.8 VLCR STR LF (GAUZE/BANDAGES/DRESSINGS) ×3 IMPLANT
CEMENT HV SMART SET (Cement) ×6 IMPLANT
CLOSURE STERI-STRIP 1/2X4 (GAUZE/BANDAGES/DRESSINGS) ×1
CLOSURE WOUND 1/2 X4 (GAUZE/BANDAGES/DRESSINGS)
CLSR STERI-STRIP ANTIMIC 1/2X4 (GAUZE/BANDAGES/DRESSINGS) ×2 IMPLANT
COVER SURGICAL LIGHT HANDLE (MISCELLANEOUS) ×3 IMPLANT
COVER WAND RF STERILE (DRAPES) IMPLANT
CUFF TOURN SGL QUICK 34 (TOURNIQUET CUFF) ×2
CUFF TRNQT CYL 34X4.125X (TOURNIQUET CUFF) ×1 IMPLANT
DECANTER SPIKE VIAL GLASS SM (MISCELLANEOUS) ×3 IMPLANT
DRAPE INCISE IOBAN 66X45 STRL (DRAPES) IMPLANT
DRAPE ORTHO SPLIT 77X108 STRL (DRAPES) ×4
DRAPE SHEET LG 3/4 BI-LAMINATE (DRAPES) ×6 IMPLANT
DRAPE SURG ORHT 6 SPLT 77X108 (DRAPES) ×2 IMPLANT
DRAPE U-SHAPE 47X51 STRL (DRAPES) ×3 IMPLANT
DRSG AQUACEL AG ADV 3.5X10 (GAUZE/BANDAGES/DRESSINGS) ×3 IMPLANT
DRSG TEGADERM 4X4.75 (GAUZE/BANDAGES/DRESSINGS) IMPLANT
DURAPREP 26ML APPLICATOR (WOUND CARE) ×3 IMPLANT
ELECT BLADE TIP CTD 4 INCH (ELECTRODE) ×3 IMPLANT
ELECT REM PT RETURN 15FT ADLT (MISCELLANEOUS) ×3 IMPLANT
EVACUATOR 1/8 PVC DRAIN (DRAIN) IMPLANT
GAUZE SPONGE 2X2 8PLY STRL LF (GAUZE/BANDAGES/DRESSINGS) IMPLANT
GLOVE BIOGEL PI IND STRL 7.5 (GLOVE) ×1 IMPLANT
GLOVE BIOGEL PI IND STRL 8 (GLOVE) ×1 IMPLANT
GLOVE BIOGEL PI INDICATOR 7.5 (GLOVE) ×2
GLOVE BIOGEL PI INDICATOR 8 (GLOVE) ×2
GLOVE SURG SS PI 7.5 STRL IVOR (GLOVE) ×6 IMPLANT
GLOVE SURG SS PI 8.0 STRL IVOR (GLOVE) ×6 IMPLANT
GOWN STRL REUS W/TWL XL LVL3 (GOWN DISPOSABLE) ×6 IMPLANT
HANDPIECE INTERPULSE COAX TIP (DISPOSABLE) ×2
HEMOSTAT SPONGE AVITENE ULTRA (HEMOSTASIS) ×3 IMPLANT
HOLDER FOLEY CATH W/STRAP (MISCELLANEOUS) IMPLANT
IMMOBILIZER KNEE 20 (SOFTGOODS) ×3
IMMOBILIZER KNEE 20 THIGH 36 (SOFTGOODS) ×1 IMPLANT
KIT TURNOVER KIT A (KITS) IMPLANT
MANIFOLD NEPTUNE II (INSTRUMENTS) ×3 IMPLANT
NDL SAFETY ECLIPSE 18X1.5 (NEEDLE) IMPLANT
NEEDLE HYPO 18GX1.5 SHARP (NEEDLE)
NS IRRIG 1000ML POUR BTL (IV SOLUTION) IMPLANT
PACK TOTAL KNEE CUSTOM (KITS) ×3 IMPLANT
PATELLA MEDIAL ATTUN 35MM KNEE (Knees) ×3 IMPLANT
PENCIL SMOKE EVACUATOR (MISCELLANEOUS) IMPLANT
PIN FIX SIGMA LCS THRD HI (PIN) ×3 IMPLANT
PIN STEINMAN FIXATION KNEE (PIN) ×3 IMPLANT
PROTECTOR NERVE ULNAR (MISCELLANEOUS) ×3 IMPLANT
SEALER BIPOLAR AQUA 6.0 (INSTRUMENTS) IMPLANT
SET HNDPC FAN SPRY TIP SCT (DISPOSABLE) ×1 IMPLANT
SPONGE GAUZE 2X2 STER 10/PKG (GAUZE/BANDAGES/DRESSINGS)
SPONGE SURGIFOAM ABS GEL 100 (HEMOSTASIS) IMPLANT
STAPLER VISISTAT (STAPLE) IMPLANT
STRIP CLOSURE SKIN 1/2X4 (GAUZE/BANDAGES/DRESSINGS) IMPLANT
SUT BONE WAX W31G (SUTURE) IMPLANT
SUT MNCRL AB 3-0 PS2 18 (SUTURE) ×3 IMPLANT
SUT MNCRL AB 4-0 PS2 18 (SUTURE) ×3 IMPLANT
SUT STRATAFIX 0 PDS 27 VIOLET (SUTURE) ×3
SUT VIC AB 1 CT1 27 (SUTURE) ×6
SUT VIC AB 1 CT1 27XBRD ANTBC (SUTURE) ×3 IMPLANT
SUT VIC AB 1 CTX 36 (SUTURE)
SUT VIC AB 1 CTX36XBRD ANBCTR (SUTURE) IMPLANT
SUT VIC AB 2-0 CT1 27 (SUTURE) ×8
SUT VIC AB 2-0 CT1 TAPERPNT 27 (SUTURE) ×4 IMPLANT
SUTURE STRATFX 0 PDS 27 VIOLET (SUTURE) ×1 IMPLANT
SYR 3ML LL SCALE MARK (SYRINGE) IMPLANT
SYR 50ML LL SCALE MARK (SYRINGE) IMPLANT
TIBIAL BASE ROT PLAT SZ 5 KNEE (Knees) ×3 IMPLANT
TOWER CARTRIDGE SMART MIX (DISPOSABLE) ×3 IMPLANT
TRAY FOLEY MTR SLVR 16FR STAT (SET/KITS/TRAYS/PACK) ×3 IMPLANT
WATER STERILE IRR 1000ML POUR (IV SOLUTION) ×3 IMPLANT
WIPE CHG CHLORHEXIDINE 2% (PERSONAL CARE ITEMS) ×3 IMPLANT
WRAP KNEE MAXI GEL POST OP (GAUZE/BANDAGES/DRESSINGS) ×3 IMPLANT
YANKAUER SUCT BULB TIP 10FT TU (MISCELLANEOUS) ×3 IMPLANT

## 2020-02-01 NOTE — Anesthesia Postprocedure Evaluation (Signed)
Anesthesia Post Note  Patient: Jenna Hancock  Procedure(s) Performed: TOTAL KNEE ARTHROPLASTY (Left Knee)     Patient location during evaluation: PACU Anesthesia Type: Regional Level of consciousness: awake and alert Pain management: pain level controlled Vital Signs Assessment: post-procedure vital signs reviewed and stable Respiratory status: spontaneous breathing, nonlabored ventilation and respiratory function stable Cardiovascular status: blood pressure returned to baseline and stable Postop Assessment: no apparent nausea or vomiting Anesthetic complications: no   No complications documented.  Last Vitals:  Vitals:   02/01/20 1245 02/01/20 1337  BP: (!) 141/66 (!) 155/73  Pulse: 62 62  Resp: 15 17  Temp: 36.8 C   SpO2: 96% 100%    Last Pain:  Vitals:   02/01/20 1245  TempSrc:   PainSc: 0-No pain                 Lynda Rainwater

## 2020-02-01 NOTE — Anesthesia Procedure Notes (Signed)
Spinal  Patient location during procedure: OR Start time: 02/01/2020 8:40 AM End time: 02/01/2020 8:53 AM Staffing Performed: resident/CRNA  Anesthesiologist: Lynda Rainwater, MD Resident/CRNA: Lavina Hamman, CRNA Preanesthetic Checklist Completed: patient identified, IV checked, site marked, risks and benefits discussed, surgical consent, monitors and equipment checked, pre-op evaluation and timeout performed Spinal Block Patient position: sitting Prep: DuraPrep Patient monitoring: heart rate, cardiac monitor, continuous pulse ox and blood pressure Approach: midline Location: L4-5 Injection technique: single-shot Needle Needle type: Sprotte  Needle gauge: 24 G Needle length: 9 cm Needle insertion depth: 8 cm Assessment Sensory level: T4 Additional Notes IV functioning, monitors applied to pt. Expiration date of kit checked and confirmed to be in date. Sterile prep and drape, hand hygiene and sterile gloved used. Pt was positioned and spine was prepped in sterile fashion. Skin was anesthetized with lidocaine. Free flow of clear CSF obtained prior to injecting local anesthetic into CSF x 1 attempt. Spinal needle aspirated freely following injection. Needle was carefully withdrawn, and pt tolerated procedure well. Loss of motor and sensory on exam post injection.

## 2020-02-01 NOTE — Anesthesia Preprocedure Evaluation (Addendum)
Anesthesia Evaluation  Patient identified by MRN, date of birth, ID band Patient awake    Reviewed: Allergy & Precautions, NPO status , Patient's Chart, lab work & pertinent test results, reviewed documented beta blocker date and time   History of Anesthesia Complications (+) PONV  Airway Mallampati: II  TM Distance: >3 FB Neck ROM: Full    Dental no notable dental hx.    Pulmonary asthma , sleep apnea , Current Smoker and Patient abstained from smoking.,    Pulmonary exam normal breath sounds clear to auscultation       Cardiovascular hypertension, Pt. on medications and Pt. on home beta blockers negative cardio ROS Normal cardiovascular exam Rhythm:Regular Rate:Normal     Neuro/Psych Anxiety Depression TIACVA negative psych ROS   GI/Hepatic Neg liver ROS, GERD  ,  Endo/Other  negative endocrine ROS  Renal/GU negative Renal ROS  negative genitourinary   Musculoskeletal  (+) Arthritis , Osteoarthritis,    Abdominal (+) + obese,   Peds negative pediatric ROS (+)  Hematology negative hematology ROS (+)   Anesthesia Other Findings   Reproductive/Obstetrics negative OB ROS                            Anesthesia Physical Anesthesia Plan  ASA: III  Anesthesia Plan: Spinal and Regional   Post-op Pain Management:  Regional for Post-op pain   Induction:   PONV Risk Score and Plan: 2 and Ondansetron, Midazolam and Treatment may vary due to age or medical condition  Airway Management Planned: Simple Face Mask  Additional Equipment:   Intra-op Plan:   Post-operative Plan:   Informed Consent: I have reviewed the patients History and Physical, chart, labs and discussed the procedure including the risks, benefits and alternatives for the proposed anesthesia with the patient or authorized representative who has indicated his/her understanding and acceptance.     Dental advisory  given  Plan Discussed with: CRNA  Anesthesia Plan Comments:         Anesthesia Quick Evaluation

## 2020-02-01 NOTE — Plan of Care (Signed)
  Problem: Education: Goal: Knowledge of General Education information will improve Description: Including pain rating scale, medication(s)/side effects and non-pharmacologic comfort measures Outcome: Progressing   Problem: Health Behavior/Discharge Planning: Goal: Ability to manage health-related needs will improve Outcome: Progressing   Problem: Clinical Measurements: Goal: Ability to maintain clinical measurements within normal limits will improve Outcome: Progressing Goal: Will remain free from infection Outcome: Progressing Goal: Diagnostic test results will improve Outcome: Progressing Goal: Respiratory complications will improve Outcome: Progressing Goal: Cardiovascular complication will be avoided Outcome: Progressing   Problem: Activity: Goal: Risk for activity intolerance will decrease Outcome: Progressing   Problem: Nutrition: Goal: Adequate nutrition will be maintained Outcome: Progressing   Problem: Coping: Goal: Level of anxiety will decrease Outcome: Progressing   Problem: Elimination: Goal: Will not experience complications related to bowel motility Outcome: Progressing Goal: Will not experience complications related to urinary retention Outcome: Progressing   Problem: Pain Managment: Goal: General experience of comfort will improve Outcome: Progressing   Problem: Safety: Goal: Ability to remain free from injury will improve Outcome: Progressing   Problem: Skin Integrity: Goal: Risk for impaired skin integrity will decrease Outcome: Progressing   Problem: Education: Goal: Knowledge of the prescribed therapeutic regimen will improve Outcome: Progressing Goal: Individualized Educational Video(s) Outcome: Progressing   Problem: Activity: Goal: Ability to avoid complications of mobility impairment will improve Outcome: Progressing   Problem: Activity: Goal: Range of joint motion will improve Outcome: Progressing   Problem: Clinical  Measurements: Goal: Postoperative complications will be avoided or minimized Outcome: Progressing   Problem: Pain Management: Goal: Pain level will decrease with appropriate interventions Outcome: Progressing   Problem: Skin Integrity: Goal: Will show signs of wound healing Outcome: Progressing

## 2020-02-01 NOTE — Care Management CC44 (Signed)
Condition Code 44 Documentation Completed  Patient Details  Name: ALMIRA PHETTEPLACE MRN: [id number] Date of Birth: 1960-07-20   Condition Code 44 given:  Yes Patient signature on Condition Code 44 notice:  Yes Documentation of 2 MD's agreement:  Yes Code 44 added to claim:  Yes    Seneca Hoback, [place] 02/01/2020, 2:03 PM

## 2020-02-01 NOTE — Op Note (Signed)
NAME: Jenna Hancock, Jenna Hancock MEDICAL RECORD YS:[id number] ACCOUNT [id number] DATE OF BIRTH:Nov 01, 1960 FACILITY: [facility] LOCATION: [facility] PHYSICIAN:Karinda Cabriales Windy Kalata, MD  OPERATIVE REPORT  DATE OF PROCEDURE:  02/01/2020  PREOPERATIVE DIAGNOSIS:  End-stage osteoarthritis of the left knee.  POSTOPERATIVE DIAGNOSIS:  End-stage osteoarthritis of the left knee.  PROCEDURE PERFORMED:  Left total knee arthroplasty utilizing DePuy rotating platform 7 femur, 5 tibia, 5 insert, 35 patella.  ANESTHESIA:  Spinal.  ASSISTANT:  Nehemiah Massed, PA-C  HISTORY:  A 59 year old bone-on-bone arthrosis, medial compartment of the left knee refractory to conservative treatment.  Negative affect to her activities of daily living.  Indicated for replacement of the degenerative joint.  Risks and benefits  discussed including bleeding, infection, damage to neurovascular structures, DVT, PE, anesthetic complications, need for revision, arthrofibrosis, etc.  TECHNIQUE:  With the patient in supine position after induction of adequate spinal anesthesia, she was given 2 grams of Kefzol.  Following that, the left lower extremity was prepped and draped in the usual sterile fashion, exsanguinated with a thigh  tourniquet inflated to 250 mmHg.  Midline incision was then made over the patella.  Full thickness flaps developed.  Median parapatellar arthrotomy performed.  Patella was gently everted.  There were infrapatellar adhesions noted.  These were excised.   Patella everted and knee flexed.  Tricompartmental osteoarthrosis was noted particularly in the medial compartment.  There was noted bone at the distal medial femur and proximal tibia that was dysvascular.  I removed the remnants of the medial and  lateral meniscus, removed osteophytes with a rongeur.  I used a step drill then to enter the femoral canal.  This was irrigated and a T-handle placed, an intramedullary guide placed with 9 measured off the distal femur.  This was  subsequently pinned.  A  distal femoral cut applied protecting soft tissues at all times.  Next, we sized the distal femur off the anterior cortex to a 7.  It was actually between a 6 and a 7.  We chose a 7.  This was 5-degree left, 3 degrees of external rotation, it was pinned.   We used a 7 block.  We performed our anterior cut and we felt that this was insufficient.  I repinned it 1/2 pin level below, 1 mm.  Repinned the distal femoral jig and then recut the anterior portion and then the posterior portion with the posterior  soft tissues protected at all times with a curved Crego.  Then, the chamfer cuts were then performed as well.  We were near flush to the anterior cortex.  I then subluxed the tibia with a McHale.  Eburnated bone was noted medially.  Some deficit.  Using  external alignment guide to help the defect, which was medial.  Parallel to the tibial shaft bisecting the tibiotalar joint, 3 degrees of slope.  After this was pinned performed a proximal tibial cut.  Prior to this, the exposure included elevated and  soft tissues medially protecting the MCL.  Osteophytes were removed from the proximal medial tibia.  This is a release.  After the tibia cut then we used a spacer block.  This was satisfactory in full extension and full flexion and stable with varus and  valgus stressing.  Following this, we reflexed the tibia, subluxed the tibia.  I used a tibial tray, first a 7, which seemed to overhang significantly medially, a 6 overhung just slightly, as well.  This was sized to a 5.  Pinned for maximal coverage  just the  medial aspect of the tibial tubercle.  We then harvested bone from the proximal tibia.  We then drilled it centrally and used our punch guide.  We then turned our attention back to the femur performing our box cut bisecting the canal.  This was  performed with an oscillating saw.  We removed additional bone within the box cut itself.  We put a trial femur in following this and it  fit flush.  I used a 5 insert and inserted it, reduced the knee and we had full extension, full flexion, good  stability with varus valgus stressing at 30 degrees and negative anterior drawer.  We then turned our attention to the patella.  It measured to a 22.  We planed it to a 15 utilizing the external patellar guide.  This was utilizing an oscillating saw.   This was then measured residual at 15.  We used a trial patella, which was a 35, medializing this, drilling our peg holes, and then placing a trial patella, reducing the patella and we had excellent patellofemoral tracking following this.  I removed all  trials.  We checked posteriorly, removed further residuals of menisci.  Popliteus was intact.  We used an Aquamantys for electrocautery of the geniculates.  Following this, we used pulsatile lavage within the knee.  We flexed the knee, subluxed the  tibia.  Mixed cement on the back table in appropriate fashion.  After drying all surfaces thoroughly we injected cement into the tibial canal digitally pressurizing it.  Cement was placed in the tibial tray and impacted.  Redundant cement removed.  We  cemented and impacted the femur as well with an excellent fit.  I placed a 5 insert and reduced the knee, held in extension with axial load throughout the curing of the cement.  Redundant cement was removed.  I cemented and clamped the patella.   Following adequate curing of the cement and removed all redundant cement.  Tourniquet was deflated and any small bleeders were cauterized.  This is at 78 minutes.  Following this, curing of the cement, flexed and extended the knee.  Had good extension,  good flexion, good stability with varus valgus stressing at 0-30 degrees.  Negative anterior drawer.  I then removed the 5 insert.  Selected a permanent 5 insert and meticulously removed all redundant cement and copiously irrigated the wound with  antibiotic irrigation.  Subluxed the tibia, placed a permanent 5  insert, reduced the knee and again had full extension, full flexion, good stability to varus valgus stress and negative anterior drawer and excellent patellofemoral tracking.  I then held  the knee in slight flexion, reapproximated the patellar arthrotomy with #1 Vicryl interrupted figure-of-eight sutures and then overrunning with a StrataFix.  Following this, I had full flexion, full extension, excellent patellofemoral tracking, and  excellent stability with varus, valgus stressing at 0-30 degrees.  Copiously irrigated the wound again, subcutaneous with 2-0 and skin with a Monocryl.  Sterile dressing applied, placed in immobilizer and transported to recovery room in satisfactory  condition.  The patient tolerated the procedure well.  No complications.    BLOOD LOSS:  50 mL.  CN/NUANCE  D:02/01/2020 T:02/01/2020 JOB:[id number]

## 2020-02-01 NOTE — Anesthesia Procedure Notes (Signed)
Anesthesia Regional Block: Adductor canal block   Pre-Anesthetic Checklist: ,, timeout performed, Correct Patient, Correct Site, Correct Laterality, Correct Procedure, Correct Position, site marked, Risks and benefits discussed,  Surgical consent,  Pre-op evaluation,  At surgeon's request and post-op pain management  Laterality: Left  Prep: chloraprep       Needles:  Injection technique: Single-shot  Needle Type: Stimiplex     Needle Length: 9cm  Needle Gauge: 21     Additional Needles:   Procedures:,,,, ultrasound used (permanent image in chart),,,,  Narrative:  Start time: 02/01/2020 8:01 AM End time: 02/01/2020 8:06 AM Injection made incrementally with aspirations every 5 mL.  Performed by: Personally  Anesthesiologist: Lynda Rainwater, MD

## 2020-02-01 NOTE — Transfer of Care (Signed)
Immediate Anesthesia Transfer of Care Note  Patient: Jenna Hancock  Procedure(s) Performed: Procedure(s) with comments: TOTAL KNEE ARTHROPLASTY (Left) - general or spinal  Patient Location: PACU  Anesthesia Type:Spinal  Level of Consciousness:  sedated, patient cooperative and responds to stimulation  Airway & Oxygen Therapy:Patient Spontanous Breathing and Patient connected to face mask oxgen  Post-op Assessment:  Report given to PACU RN and Post -op Vital signs reviewed and stable  Post vital signs:  Reviewed and stable  Last Vitals:  Vitals:   02/01/20 0649 02/01/20 1151  BP: (!) 177/73 126/61  Pulse: 76   Resp: 16   Temp: 36.9 C   SpO2: 32%     Complications: No apparent anesthesia complications

## 2020-02-01 NOTE — Evaluation (Signed)
Physical Therapy Evaluation Patient Details Name: Jenna Hancock MRN: [id number] DOB: [birth year] Today's Date: 02/01/2020   History of Present Illness  Patient 59 y.o. female s/p Lt TKA on 02/01/20 with PMH significant for TIA, HTN, HLD, GERD, OA, depression, anxitey, Rt TKA.    Clinical Impression  Jenna Hancock is a 59 y.o. female POD 0 s/p Lt TKA. Patient reports independence with mobility at baseline. Patient is now limited by functional impairments (see PT problem list below) and requires min assist for transfers and gait with RW. Patient was able to ambulate ~30 feet with RW and min assist. Patient instructed in exercise to facilitate ROM and circulation. Patient will benefit from continued skilled PT interventions to address impairments and progress towards PLOF. Acute PT will follow to progress mobility and stair training in preparation for safe discharge home.      Follow Up Recommendations Follow surgeon's recommendation for DC plan and follow-up therapies;Home health PT    Equipment Recommendations  3in1 (PT)    Recommendations for Other Services       Precautions / Restrictions Precautions Precautions: Fall Restrictions Weight Bearing Restrictions: No      Mobility  Bed Mobility Overal bed mobility: Needs Assistance Bed Mobility: Supine to Sit     Supine to sit: Min assist;HOB elevated     General bed mobility comments: VC's for use of bed rail, assist for Lt LE mobility and to raise trunk to sit EOB.  Transfers Overall transfer level: Needs assistance Equipment used: Rolling walker (2 wheeled) Transfers: Sit to/from Stand Sit to Stand: Min assist         General transfer comment: Cues for technique with RW, assist for power up and to steady with rising.   Ambulation/Gait Ambulation/Gait assistance: Min assist Gait Distance (Feet): 30 Feet Assistive device: Rolling walker (2 wheeled) Gait Pattern/deviations: Step-to pattern;Decreased step length -  right;Decreased step length - left;Decreased stride length;Decreased stance time - left;Decreased weight shift to left Gait velocity: decr   General Gait Details: cues for safe step pattern and proximity to RW, noovert LOB noted or buckling at Lt knee.   Stairs            Wheelchair Mobility    Modified Rankin (Stroke Patients Only)       Balance Overall balance assessment: Needs assistance Sitting-balance support: Feet supported Sitting balance-Leahy Scale: Good     Standing balance support: During functional activity;Bilateral upper extremity supported Standing balance-Leahy Scale: Poor                Pertinent Vitals/Pain Pain Assessment: 0-10 Pain Score: 8  Pain Location: Lt knee Pain Descriptors / Indicators: Aching;Dull;Discomfort Pain Intervention(s): Limited activity within patient's tolerance;Monitored during session;Repositioned;Patient requesting pain meds-RN notified;RN gave pain meds during session;Ice applied    Home Living Family/patient expects to be discharged to:: Private residence Living Arrangements: Spouse/significant other Available Help at Discharge: Friend(s) Type of Home: House Home Access: Stairs to enter Entrance Stairs-Rails: Psychiatric nurse of Steps: 3 Home Layout: One level Home Equipment: Environmental consultant - 2 wheels;Toilet riser;Cane - single point      Prior Function Level of Independence: Independent with assistive device(s)         Comments: SPC around the house     Hand Dominance   Dominant Hand: Right    Extremity/Trunk Assessment   Upper Extremity Assessment Upper Extremity Assessment: Overall WFL for tasks assessed    Lower Extremity Assessment Lower Extremity Assessment: LLE deficits/detail LLE Deficits /  Details: good quad acitvaiton, no extensor lag with SLR LLE Sensation: WNL LLE Coordination: WNL    Cervical / Trunk Assessment Cervical / Trunk Assessment: Normal  Communication    Communication: No difficulties  Cognition Arousal/Alertness: Awake/alert Behavior During Therapy: WFL for tasks assessed/performed Overall Cognitive Status: Within Functional Limits for tasks assessed                   General Comments      Exercises Total Joint Exercises Ankle Circles/Pumps: AROM;Both;20 reps;Seated Quad Sets: AROM;Left;5 reps;Seated Heel Slides: AROM;Left;5 reps;Seated   Assessment/Plan    PT Assessment Patient needs continued PT services  PT Problem List Decreased strength;Decreased range of motion;Decreased activity tolerance;Decreased balance;Decreased mobility;Decreased knowledge of use of DME;Decreased knowledge of precautions;Pain       PT Treatment Interventions DME instruction;Gait training;Stair training;Functional mobility training;Therapeutic activities;Therapeutic exercise;Balance training;Patient/family education    PT Goals (Current goals can be found in the Care Plan section)  Acute Rehab PT Goals Patient Stated Goal: get back to independence and gardening PT Goal Formulation: With patient Time For Goal Achievement: 02/08/20 Potential to Achieve Goals: Good    Frequency 7X/week   Barriers to discharge           AM-PAC PT "6 Clicks" Mobility  Outcome Measure Help needed turning from your back to your side while in a flat bed without using bedrails?: A Little Help needed moving from lying on your back to sitting on the side of a flat bed without using bedrails?: A Little Help needed moving to and from a bed to a chair (including a wheelchair)?: A Little Help needed standing up from a chair using your arms (e.g., wheelchair or bedside chair)?: A Little Help needed to walk in hospital room?: A Little Help needed climbing 3-5 steps with a railing? : A Little 6 Click Score: 18    End of Session Equipment Utilized During Treatment: Gait belt Activity Tolerance: Patient tolerated treatment well Patient left: in chair;with call  bell/phone within reach;with chair alarm set Nurse Communication: Mobility status;Patient requests pain meds PT Visit Diagnosis: Muscle weakness (generalized) (M62.81);Difficulty in walking, not elsewhere classified (R26.2)    Time: 9179-1505 PT Time Calculation (min) (ACUTE ONLY): 21 min   Charges:   PT Evaluation $PT Eval Low Complexity: 1 Low          Verner Mould, DPT Acute Rehabilitation Services  Office [phone] Pager [phone]  02/01/2020 4:39 PM

## 2020-02-01 NOTE — Interval H&P Note (Signed)
History and Physical Interval Note:  02/01/2020 8:18 AM  Jenna Hancock  has presented today for surgery, with the diagnosis of Left knee degenerative joint disease.  The various methods of treatment have been discussed with the patient and family. After consideration of risks, benefits and other options for treatment, the patient has consented to  Procedure(s) with comments: TOTAL KNEE ARTHROPLASTY (Left) - general or spinal as a surgical intervention.  The patient's history has been reviewed, patient examined, no change in status, stable for surgery.  I have reviewed the patient's chart and labs.  Questions were answered to the patient's satisfaction.     Johnn Hai

## 2020-02-01 NOTE — Care Management Obs Status (Signed)
[name] NOTIFICATION   Patient Details  Name: Jenna Hancock MRN: [id number] Date of Birth: Sep 06, 1960   Medicare Observation Status Notification Given:  Macario Golds, [place] 02/01/2020, 2:03 PM

## 2020-02-01 NOTE — Brief Op Note (Signed)
02/01/2020  11:17 AM  PATIENT:  Jenna Hancock  59 y.o. female  PRE-OPERATIVE DIAGNOSIS:  Left knee degenerative joint disease  POST-OPERATIVE DIAGNOSIS:  Left knee degenerative joint disease  PROCEDURE:  Procedure(s) with comments: TOTAL KNEE ARTHROPLASTY (Left) - general or spinal  SURGEON:  Surgeon(s) and Role:    Susa Day, MD - Primary  PHYSICIAN ASSISTANT:   ASSISTANTS: Nehemiah Massed   ANESTHESIA:   spinal  EBL:  50 mL   BLOOD ADMINISTERED:none  DRAINS: none   LOCAL MEDICATIONS USED:  MARCAINE     SPECIMEN:  No Specimen  DISPOSITION OF SPECIMEN:  N/A  COUNTS:  YES  TOURNIQUET:   Total Tourniquet Time Documented: Thigh (Left) - 79 minutes Total: Thigh (Left) - 79 minutes   DICTATION: .Other Dictation: Dictation Number [id number]  PLAN OF CARE: Admit to inpatient   PATIENT DISPOSITION:  PACU - hemodynamically stable.   Delay start of Pharmacological VTE agent (>24hrs) due to surgical blood loss or risk of bleeding: no

## 2020-02-02 DIAGNOSIS — F17210 Nicotine dependence, cigarettes, uncomplicated: Secondary | ICD-10-CM | POA: Diagnosis present

## 2020-02-02 DIAGNOSIS — G4730 Sleep apnea, unspecified: Secondary | ICD-10-CM | POA: Diagnosis present

## 2020-02-02 DIAGNOSIS — K219 Gastro-esophageal reflux disease without esophagitis: Secondary | ICD-10-CM | POA: Diagnosis present

## 2020-02-02 DIAGNOSIS — M1712 Unilateral primary osteoarthritis, left knee: Secondary | ICD-10-CM | POA: Diagnosis present

## 2020-02-02 DIAGNOSIS — Z8673 Personal history of transient ischemic attack (TIA), and cerebral infarction without residual deficits: Secondary | ICD-10-CM | POA: Diagnosis not present

## 2020-02-02 DIAGNOSIS — Z823 Family history of stroke: Secondary | ICD-10-CM | POA: Diagnosis not present

## 2020-02-02 DIAGNOSIS — Z20822 Contact with and (suspected) exposure to covid-19: Secondary | ICD-10-CM | POA: Diagnosis present

## 2020-02-02 DIAGNOSIS — Z8249 Family history of ischemic heart disease and other diseases of the circulatory system: Secondary | ICD-10-CM | POA: Diagnosis not present

## 2020-02-02 DIAGNOSIS — F418 Other specified anxiety disorders: Secondary | ICD-10-CM | POA: Diagnosis present

## 2020-02-02 DIAGNOSIS — Z833 Family history of diabetes mellitus: Secondary | ICD-10-CM | POA: Diagnosis not present

## 2020-02-02 DIAGNOSIS — I10 Essential (primary) hypertension: Secondary | ICD-10-CM | POA: Diagnosis present

## 2020-02-02 DIAGNOSIS — E785 Hyperlipidemia, unspecified: Secondary | ICD-10-CM | POA: Diagnosis present

## 2020-02-02 DIAGNOSIS — Z801 Family history of malignant neoplasm of trachea, bronchus and lung: Secondary | ICD-10-CM | POA: Diagnosis not present

## 2020-02-02 LAB — CBC
HCT: 34.8 % — ABNORMAL LOW (ref 36.0–46.0)
Hemoglobin: 11.3 g/dL — ABNORMAL LOW (ref 12.0–15.0)
MCH: 30.1 pg (ref 26.0–34.0)
MCHC: 32.5 g/dL (ref 30.0–36.0)
MCV: 92.8 fL (ref 80.0–100.0)
Platelets: 234 10*3/uL (ref 150–400)
RBC: 3.75 MIL/uL — ABNORMAL LOW (ref 3.87–5.11)
RDW: 12 % (ref 11.5–15.5)
WBC: 14.5 10*3/uL — ABNORMAL HIGH (ref 4.0–10.5)
nRBC: 0 % (ref 0.0–0.2)

## 2020-02-02 LAB — BASIC METABOLIC PANEL
Anion gap: 11 (ref 5–15)
BUN: 14 mg/dL (ref 6–20)
CO2: 25 mmol/L (ref 22–32)
Calcium: 8.7 mg/dL — ABNORMAL LOW (ref 8.9–10.3)
Chloride: 101 mmol/L (ref 98–111)
Creatinine, Ser: 0.63 mg/dL (ref 0.44–1.00)
GFR calc Af Amer: >60 mL/min (ref >60)
GFR calc non Af Amer: >60 mL/min (ref >60)
Glucose, Bld: 148 mg/dL — ABNORMAL HIGH (ref 70–99)
Potassium: 3.8 mmol/L (ref 3.5–5.1)
Sodium: 137 mmol/L (ref 135–145)

## 2020-02-02 MED ORDER — IBUPROFEN 400 MG PO TABS
800.0000 mg | ORAL_TABLET | Freq: Three times a day (TID) | ORAL | Status: DC | PRN
  Administered 2020-02-02: 800 mg via ORAL
  Filled 2020-02-02: qty 2

## 2020-02-02 MED ORDER — CLONIDINE HCL 0.1 MG PO TABS
0.1000 mg | ORAL_TABLET | Freq: Once | ORAL | Status: AC
  Administered 2020-02-02: 0.1 mg via ORAL
  Filled 2020-02-02: qty 1

## 2020-02-02 NOTE — Progress Notes (Signed)
Patient's blood pressure 189/71, temperature 100.9 (Tylenol 650 administered 21:43). Paged [facility].

## 2020-02-02 NOTE — Progress Notes (Signed)
Physical Therapy Treatment Patient Details Name: Jenna Hancock MRN: [id number] DOB: [birth year] Today's Date: 02/02/2020    History of Present Illness Patient 59 y.o. female s/p Lt TKA on 02/01/20 with PMH significant for TIA, HTN, HLD, GERD, OA, depression, anxitey, Rt TKA.    PT Comments    Pt very cooperative and noted to ambulate slightly increased distance in hall and requiring decreased assist for most tasks.  Pt ltd by pain and fatigues easily but appears motivated to progress.   Follow Up Recommendations  Follow surgeon's recommendation for DC plan and follow-up therapies;Home health PT     Equipment Recommendations  3in1 (PT)    Recommendations for Other Services       Precautions / Restrictions Precautions Precautions: Fall Restrictions Weight Bearing Restrictions: No Other Position/Activity Restrictions: WBAT    Mobility  Bed Mobility Overal bed mobility: Needs Assistance Bed Mobility: Supine to Sit     Supine to sit: Min assist;HOB elevated     General bed mobility comments: min assist for L LE  Transfers Overall transfer level: Needs assistance Equipment used: Rolling walker (2 wheeled) Transfers: Sit to/from Stand Sit to Stand: Min assist         General transfer comment: Cues for technique with RW, assist for power up and to steady with rising.   Ambulation/Gait Ambulation/Gait assistance: Min assist;Min guard Gait Distance (Feet): 35 Feet Assistive device: Rolling walker (2 wheeled) Gait Pattern/deviations: Step-to pattern;Decreased step length - right;Decreased step length - left;Decreased stride length;Decreased stance time - left;Decreased weight shift to left Gait velocity: decr   General Gait Details: cues for sequence, posture and position from [employer]             Wheelchair Mobility    Modified Rankin (Stroke Patients Only)       Balance Overall balance assessment: Needs assistance Sitting-balance support: Feet  supported Sitting balance-Leahy Scale: Good     Standing balance support: During functional activity;Bilateral upper extremity supported Standing balance-Leahy Scale: Poor                              Cognition Arousal/Alertness: Awake/alert Behavior During Therapy: WFL for tasks assessed/performed Overall Cognitive Status: Within Functional Limits for tasks assessed                                        Exercises Total Joint Exercises Ankle Circles/Pumps: AROM;Both;20 reps;Seated Quad Sets: AROM;Left;10 reps;Supine Heel Slides: AROM;Left;10 reps;Supine Straight Leg Raises: AAROM;Left;10 reps;Supine    General Comments        Pertinent Vitals/Pain Pain Assessment: 0-10 Pain Score: 6  Pain Location: Lt knee Pain Descriptors / Indicators: Aching;Dull;Discomfort Pain Intervention(s): Limited activity within patient's tolerance;Monitored during session;Premedicated before session;Ice applied    Home Living                      Prior Function            PT Goals (current goals can now be found in the care plan section) Acute Rehab PT Goals Patient Stated Goal: get back to independence and gardening PT Goal Formulation: With patient Time For Goal Achievement: 02/08/20 Potential to Achieve Goals: Good Progress towards PT goals: Progressing toward goals    Frequency    7X/week      PT Plan Current plan  remains appropriate    Co-evaluation              AM-PAC PT "6 Clicks" Mobility   Outcome Measure  Help needed turning from your back to your side while in a flat bed without using bedrails?: A Little Help needed moving from lying on your back to sitting on the side of a flat bed without using bedrails?: A Little Help needed moving to and from a bed to a chair (including a wheelchair)?: A Little Help needed standing up from a chair using your arms (e.g., wheelchair or bedside chair)?: A Little Help needed to walk in  hospital room?: A Little Help needed climbing 3-5 steps with a railing? : A Little 6 Click Score: 18    End of Session Equipment Utilized During Treatment: Gait belt Activity Tolerance: Patient tolerated treatment well Patient left: in chair;with call bell/phone within reach;with chair alarm set;with family/visitor present Nurse Communication: Mobility status;Patient requests pain meds PT Visit Diagnosis: Muscle weakness (generalized) (M62.81);Difficulty in walking, not elsewhere classified (R26.2)     Time: 8592-7639 PT Time Calculation (min) (ACUTE ONLY): 31 min  Charges:  $Gait Training: 8-22 mins $Therapeutic Exercise: 8-22 mins                     [place] Pager [phone] Office [phone]    Marino Rogerson 02/02/2020, 10:44 AM

## 2020-02-02 NOTE — Progress Notes (Signed)
Physical Therapy Treatment Patient Details Name: Jenna Hancock MRN: [id number] DOB: 11/27/1960 Today's Date: 02/02/2020    History of Present Illness Patient 59 y.o. female s/p Lt TKA on 02/01/20 with PMH significant for TIA, HTN, HLD, GERD, OA, depression, anxitey, Rt TKA.    PT Comments    Pt progressing steadily with mobility and able to ambulate increased distance in hall this pm with min guard assist.   Follow Up Recommendations  Follow surgeon's recommendation for DC plan and follow-up therapies;Home health PT     Equipment Recommendations  3in1 (PT)    Recommendations for Other Services       Precautions / Restrictions Precautions Precautions: Fall Restrictions Weight Bearing Restrictions: No Other Position/Activity Restrictions: WBAT    Mobility  Bed Mobility Overal bed mobility: Needs Assistance Bed Mobility: Sit to Supine       Sit to supine: Min assist   General bed mobility comments: min assist for L LE  Transfers Overall transfer level: Needs assistance Equipment used: Rolling walker (2 wheeled) Transfers: Sit to/from Stand Sit to Stand: Min assist         General transfer comment: cues for LE management and use of UEs to self assist  Ambulation/Gait Ambulation/Gait assistance: Min guard Gait Distance (Feet): 78 Feet Assistive device: Rolling walker (2 wheeled) Gait Pattern/deviations: Step-to pattern;Decreased step length - right;Decreased step length - left;Decreased stride length;Decreased stance time - left;Decreased weight shift to left Gait velocity: decr   General Gait Details: cues for sequence, posture and position from [employer] Mobility    Modified Rankin (Stroke Patients Only)       Balance Overall balance assessment: Needs assistance Sitting-balance support: Feet supported Sitting balance-Leahy Scale: Good     Standing balance support: During functional activity;Bilateral upper  extremity supported Standing balance-Leahy Scale: Poor                              Cognition Arousal/Alertness: Awake/alert Behavior During Therapy: WFL for tasks assessed/performed Overall Cognitive Status: Within Functional Limits for tasks assessed                                        Exercises      General Comments        Pertinent Vitals/Pain Pain Assessment: 0-10 Pain Score: 6  Pain Location: Lt knee Pain Descriptors / Indicators: Aching;Dull;Discomfort Pain Intervention(s): Limited activity within patient's tolerance;Monitored during session;Premedicated before session;Ice applied    Home Living                      Prior Function            PT Goals (current goals can now be found in the care plan section) Acute Rehab PT Goals Patient Stated Goal: get back to independence and gardening PT Goal Formulation: With patient Time For Goal Achievement: 02/08/20 Potential to Achieve Goals: Good Progress towards PT goals: Progressing toward goals    Frequency    7X/week      PT Plan Current plan remains appropriate    Co-evaluation              AM-PAC PT "6 Clicks" Mobility   Outcome Measure  Help needed turning from your back to your  side while in a flat bed without using bedrails?: A Little Help needed moving from lying on your back to sitting on the side of a flat bed without using bedrails?: A Little Help needed moving to and from a bed to a chair (including a wheelchair)?: A Little Help needed standing up from a chair using your arms (e.g., wheelchair or bedside chair)?: A Little Help needed to walk in hospital room?: A Little Help needed climbing 3-5 steps with a railing? : A Little 6 Click Score: 18    End of Session Equipment Utilized During Treatment: Gait belt Activity Tolerance: Patient tolerated treatment well Patient left: in bed;with call bell/phone within reach;with family/visitor  present Nurse Communication: Mobility status PT Visit Diagnosis: Muscle weakness (generalized) (M62.81);Difficulty in walking, not elsewhere classified (R26.2)     Time: 3818-4037 PT Time Calculation (min) (ACUTE ONLY): 15 min  Charges:  $Gait Training: 8-22 mins                     [place] Pager [phone] Office [phone]    Jenna Hancock 02/02/2020, 2:36 PM

## 2020-02-02 NOTE — TOC Transition Note (Signed)
Transition of Care [facility]) - CM/SW Discharge Note   Patient Details  Name: Jenna Hancock MRN: [id number] Date of Birth: [birth year]  [facility] CM/SW Contact:  Lennart Pall, LCSW Phone Number: 02/02/2020, 3:17 PM   Clinical Narrative:    Met with pt and confirming she already has needed DME.  Have ordered HHPT (see below).  Hopeful to d/c tomorrow.  Continue to monitor for any additional needs.   Final next level of care: [place] Barriers to Discharge: Continued Medical Work up   Patient Goals and CMS Choice Patient states their goals for this hospitalization and ongoing recovery are:: go home CMS Medicare.gov Compare Post Acute Care list provided to:: Patient Choice offered to / list presented to : Patient  Discharge Placement                       Discharge Plan and Services                DME Arranged: N/A DME Agency: NA       HH Arranged: PT HH Agency: [facility] [employer] Date [place]: 02/02/20 Time [place]: [place] Representative spoke with at [place]: [place] ([place]) Interventions     Readmission Risk Interventions Readmission Risk Prevention Plan 02/02/2020  Post Dischage Appt Complete  Medication Screening Complete  Transportation Screening Complete  Some recent data might be hidden

## 2020-02-02 NOTE — Progress Notes (Signed)
Subjective: 1 Day Post-Op Procedure(s) (LRB): TOTAL KNEE ARTHROPLASTY (Left) Patient reports pain as 6 on 0-10 scale.    Patient has complaints of knee pain when ambulating; denies SOB, chest pain, nausea, emesis No void as yet +flatus  We will start therapy today. Plan is to go home after hospital stay.  Objective: Vital signs in last 24 hours: Temp:  [97.7 F (36.5 C)-98.7 F (37.1 C)] 98.3 F (36.8 C) (08/19 0519) Pulse Rate:  [60-80] 77 (08/19 0519) Resp:  [13-18] 16 (08/19 0519) BP: (121-198)/(59-76) 167/76 (08/19 0519) SpO2:  [90 %-100 %] 90 % (08/19 0736) Weight:  [95.5 kg] 95.5 kg (08/18 1337)  Intake/Output from previous day:  Intake/Output Summary (Last 24 hours) at 02/02/2020 0911 Last data filed at 02/02/2020 0600 Gross per 24 hour  Intake 2221.5 ml  Output 2150 ml  Net 71.5 ml    Intake/Output this shift: No intake/output data recorded.  Labs: Results for orders placed or performed during the hospital encounter of 02/01/20  CBC  Result Value Ref Range   WBC 14.5 (H) 4.0 - 10.5 K/uL   RBC 3.75 (L) 3.87 - 5.11 MIL/uL   Hemoglobin 11.3 (L) 12.0 - 15.0 g/dL   HCT 34.8 (L) 36 - 46 %   MCV 92.8 80.0 - 100.0 fL   MCH 30.1 26.0 - 34.0 pg   MCHC 32.5 30.0 - 36.0 g/dL   RDW 12.0 11.5 - 15.5 %   Platelets 234 150 - 400 K/uL   nRBC 0.0 0.0 - 0.2 %  Basic metabolic panel  Result Value Ref Range   Sodium 137 135 - 145 mmol/L   Potassium 3.8 3.5 - 5.1 mmol/L   Chloride 101 98 - 111 mmol/L   CO2 25 22 - 32 mmol/L   Glucose, Bld 148 (H) 70 - 99 mg/dL   BUN 14 6 - 20 mg/dL   Creatinine, Ser 0.63 0.44 - 1.00 mg/dL   Calcium 8.7 (L) 8.9 - 10.3 mg/dL   GFR calc non Af Amer >60 >60 mL/min   GFR calc Af Amer >60 >60 mL/min   Anion gap 11 5 - 15    Exam - Neurovascular intact Sensation intact distally Intact pulses distally Dorsiflexion/Plantar flexion intact Compartment soft dressing dry and intact Dressing - dry Motor function intact - moving foot and toes  well on exam. Can perform SLR with assist   Assessment/Plan: 1 Day Post-Op Procedure(s) (LRB): TOTAL KNEE ARTHROPLASTY (Left)  Advance diet Up with therapy Plan for discharge tomorrow Past Medical History:  Diagnosis Date  . Anxiety   . Arthritis   . Depression   . Essential hypertension, benign 12/08/2016  . GERD (gastroesophageal reflux disease)   . Heart murmur    AS A CHILD   . High cholesterol 12/08/2016  . Hyperlipidemia   . Hypertension   . PONV (postoperative nausea and vomiting)   . Sleep apnea    DOES NOT USE CPAP   . Stroke ([place])    HX OF MINI STROKE 2016   . TIA (transient ischemic attack) 2017    DVT Prophylaxis - Xarelto / Coumadin Protocol Weight-Bearing as tolerated to left leg  Roylene Reason [phone] 02/02/2020, 9:11 AM  Patient ID: Jenna Hancock, female   DOB: 05/30/1961, 59 y.o.   MRN: [id number]

## 2020-02-02 NOTE — Plan of Care (Signed)
  Problem: Education: Goal: Knowledge of General Education information will improve Description: Including pain rating scale, medication(s)/side effects and non-pharmacologic comfort measures Outcome: Adequate for Discharge   Problem: Health Behavior/Discharge Planning: Goal: Ability to manage health-related needs will improve Outcome: Adequate for Discharge   Problem: Clinical Measurements: Goal: Ability to maintain clinical measurements within normal limits will improve Outcome: Adequate for Discharge Goal: Will remain free from infection Outcome: Adequate for Discharge Goal: Diagnostic test results will improve Outcome: Adequate for Discharge Goal: Respiratory complications will improve Outcome: Adequate for Discharge Goal: Cardiovascular complication will be avoided Outcome: Adequate for Discharge   Problem: Activity: Goal: Risk for activity intolerance will decrease Outcome: Adequate for Discharge   Problem: Nutrition: Goal: Adequate nutrition will be maintained Outcome: Adequate for Discharge   Problem: Coping: Goal: Level of anxiety will decrease Outcome: Adequate for Discharge   Problem: Elimination: Goal: Will not experience complications related to bowel motility Outcome: Adequate for Discharge Goal: Will not experience complications related to urinary retention Outcome: Adequate for Discharge   Problem: Safety: Goal: Ability to remain free from injury will improve Outcome: Adequate for Discharge   Problem: Pain Managment: Goal: General experience of comfort will improve Outcome: Adequate for Discharge   Problem: Education: Goal: Knowledge of the prescribed therapeutic regimen will improve Outcome: Adequate for Discharge Goal: Individualized Educational Video(s) Outcome: Adequate for Discharge   Problem: Activity: Goal: Ability to avoid complications of mobility impairment will improve Outcome: Adequate for Discharge Goal: Range of joint motion will  improve Outcome: Adequate for Discharge   Problem: Clinical Measurements: Goal: Postoperative complications will be avoided or minimized Outcome: Adequate for Discharge   Problem: Pain Management: Goal: Pain level will decrease with appropriate interventions Outcome: Adequate for Discharge   Problem: Skin Integrity: Goal: Will show signs of wound healing Outcome: Adequate for Discharge

## 2020-02-03 ENCOUNTER — Encounter (HOSPITAL_COMMUNITY): Payer: Self-pay | Admitting: Specialist

## 2020-02-03 LAB — CBC
HCT: 32.2 % — ABNORMAL LOW (ref 36.0–46.0)
Hemoglobin: 10.6 g/dL — ABNORMAL LOW (ref 12.0–15.0)
MCH: 30.4 pg (ref 26.0–34.0)
MCHC: 32.9 g/dL (ref 30.0–36.0)
MCV: 92.3 fL (ref 80.0–100.0)
Platelets: 253 10*3/uL (ref 150–400)
RBC: 3.49 MIL/uL — ABNORMAL LOW (ref 3.87–5.11)
RDW: 12 % (ref 11.5–15.5)
WBC: 17.8 10*3/uL — ABNORMAL HIGH (ref 4.0–10.5)
nRBC: 0 % (ref 0.0–0.2)

## 2020-02-03 MED ORDER — OXYCODONE HCL 5 MG PO TABS: 5.0000 mg | ORAL_TABLET | ORAL | 0 refills | Status: AC | PRN

## 2020-02-03 MED ORDER — ACETAMINOPHEN 325 MG PO TABS: 325.0000 mg | ORAL_TABLET | Freq: Four times a day (QID) | ORAL | 1 refills | Status: DC | PRN

## 2020-02-03 MED ORDER — DOCUSATE SODIUM 100 MG PO CAPS: 100.0000 mg | ORAL_CAPSULE | Freq: Two times a day (BID) | ORAL | 0 refills | Status: DC

## 2020-02-03 MED ORDER — ASPIRIN 325 MG PO TBEC: 325.0000 mg | DELAYED_RELEASE_TABLET | Freq: Two times a day (BID) | ORAL | 0 refills | Status: AC

## 2020-02-03 MED ORDER — METHOCARBAMOL 500 MG PO TABS: 500.0000 mg | ORAL_TABLET | Freq: Four times a day (QID) | ORAL | 0 refills | Status: DC | PRN

## 2020-02-03 MED ORDER — ONDANSETRON HCL 4 MG PO TABS: 4.0000 mg | ORAL_TABLET | Freq: Four times a day (QID) | ORAL | 0 refills | Status: DC | PRN

## 2020-02-03 MED ORDER — POLYETHYLENE GLYCOL 3350 17 G PO PACK: 17.0000 g | PACK | Freq: Every day | ORAL | 0 refills | Status: DC

## 2020-02-03 MED ORDER — RISAQUAD PO CAPS: 1.0000 | ORAL_CAPSULE | Freq: Every day | ORAL | 0 refills | Status: AC

## 2020-02-03 MED ORDER — ALUM & MAG HYDROXIDE-SIMETH 200-200-20 MG/5ML PO SUSP: 30.0000 mL | ORAL | 0 refills | Status: DC | PRN

## 2020-02-03 NOTE — Progress Notes (Signed)
Physical Therapy Treatment Patient Details Name: Jenna Hancock MRN: [id number] DOB: 1961/03/12 Today's Date: 02/03/2020    History of Present Illness Patient 59 y.o. female s/p Lt TKA on 02/01/20 with PMH significant for TIA, HTN, HLD, GERD, OA, depression, anxitey, Rt TKA.    PT Comments    POD # 2 am session Assisted with amb in hallway, practiced stairs Then returned to room to perform some TE's following HEP handout.  Instructed on proper tech, freq as well as use of ICE.   Addressed all mobility questions, discussed appropriate activity, educated on use of ICE.  Pt ready for D/C to home.   Follow Up Recommendations  Follow surgeon's recommendation for DC plan and follow-up therapies;Home health PT     Equipment Recommendations  3in1 (PT)    Recommendations for Other Services       Precautions / Restrictions Precautions Precautions: Fall Precaution Comments: instructed no pillow under knee Restrictions Weight Bearing Restrictions: No Other Position/Activity Restrictions: WBAT    Mobility  Bed Mobility               General bed mobility comments: OOB in recliner  Transfers Overall transfer level: Needs assistance Equipment used: Rolling walker (2 wheeled) Transfers: Sit to/from Stand Sit to Stand: Supervision         General transfer comment: one VC on safety with turns  Ambulation/Gait Ambulation/Gait assistance: Supervision Gait Distance (Feet): 85 Feet Assistive device: Rolling walker (2 wheeled) Gait Pattern/deviations: Step-to pattern;Decreased step length - right;Decreased step length - left;Decreased stride length;Decreased stance time - left;Decreased weight shift to left Gait velocity: decr   General Gait Details: < 25% cues for sequence, posture and position from RW   Stairs Stairs: Yes Stairs assistance: Supervision;Min guard Stair Management: One rail Left;Step to pattern;Forwards Number of Stairs: 2 General stair comments: with  spouse present for education and 25% VC's on proper tech/safety   Wheelchair Mobility    Modified Rankin (Stroke Patients Only)       Balance                                            Cognition Arousal/Alertness: Awake/alert Behavior During Therapy: WFL for tasks assessed/performed Overall Cognitive Status: Within Functional Limits for tasks assessed                                 General Comments: AxO x 3 pleasant and motivated      Exercises   Total Knee Replacement TE's following HEP handout 10 reps B LE ankle pumps 05 reps towel squeezes 05 reps knee presses 05 reps heel slides  05 reps SAQ's 05 reps SLR's 05 reps ABD Educated on use of gait belt to assist with TE's Followed by ICE     General Comments        Pertinent Vitals/Pain Pain Assessment: No/denies pain Pain Score: 3  Pain Location: Lt knee Pain Descriptors / Indicators: Aching;Dull;Discomfort;Operative site guarding Pain Intervention(s): Monitored during session;Premedicated before session;Repositioned;Ice applied    Home Living                      Prior Function            PT Goals (current goals can now be found in the care plan section) Progress towards  PT goals: Progressing toward goals    Frequency    7X/week      PT Plan      Co-evaluation              AM-PAC PT "6 Clicks" Mobility   Outcome Measure  Help needed turning from your back to your side while in a flat bed without using bedrails?: A Little Help needed moving from lying on your back to sitting on the side of a flat bed without using bedrails?: A Little Help needed moving to and from a bed to a chair (including a wheelchair)?: A Little Help needed standing up from a chair using your arms (e.g., wheelchair or bedside chair)?: A Little Help needed to walk in hospital room?: A Little Help needed climbing 3-5 steps with a railing? : A Little 6 Click Score: 18     End of Session Equipment Utilized During Treatment: Gait belt Activity Tolerance: Patient tolerated treatment well Patient left: in chair;with call bell/phone within reach;with family/visitor present Nurse Communication: Mobility status PT Visit Diagnosis: Muscle weakness (generalized) (M62.81);Difficulty in walking, not elsewhere classified (R26.2)     Time: 1100-1130 PT Time Calculation (min) (ACUTE ONLY): 30 min  Charges:  $Gait Training: 8-22 mins $Therapeutic Exercise: 8-22 mins                     {Bennie Chirico  PTA Acute  Rehabilitation Services Pager      [phone] Office      [phone]

## 2020-02-03 NOTE — Progress Notes (Signed)
Patient stated her pain level is much less today. Vitals stable this a.m.

## 2020-02-03 NOTE — Discharge Instructions (Signed)
Elevate leg above heart 6x a day for 65minutes each Use knee immobilizer while walking until can SLR x 10 Use knee immobilizer in bed to keep knee in extension Aquacel dressing may remain in place until follow up. May shower with aquacel dressing in place. If the dressing becomes saturated or peels off, you may remove aquacel dressing. Do not remove steri-strips if they are present. Place new dressing with gauze and tape or ACE bandage which should be kept clean and dry and changed daily.

## 2020-02-03 NOTE — Care Management Important Message (Signed)
Important Message  Patient Details IM Letter given to the Patient Name: Jenna Hancock MRN: [id number] Date of Birth: May 06, 1961   Medicare Important Message Given:  Yes     Kerin Salen 02/03/2020, 11:53 AM

## 2020-02-03 NOTE — Progress Notes (Signed)
Subjective: 2 Days Post-Op Procedure(s) (LRB): TOTAL KNEE ARTHROPLASTY (Left) Patient reports pain as 2 on 0-10 scale.    Patient has complaints of pain much improved from yesterday Denies sob, nausea Voiding without difficulty +flatus  Objective: Vital signs in last 24 hours: Temp:  [98.2 F (36.8 C)-100.9 F (38.3 C)] 98.3 F (36.8 C) (08/20 0514) Pulse Rate:  [68-82] 68 (08/20 0514) Resp:  [16-17] 17 (08/20 0514) BP: (138-189)/(60-74) 138/64 (08/20 0514) SpO2:  [92 %-96 %] 95 % (08/20 0907)  Intake/Output from previous day:  Intake/Output Summary (Last 24 hours) at 02/03/2020 0913 Last data filed at 02/03/2020 0911 Gross per 24 hour  Intake 771.67 ml  Output 250 ml  Net 521.67 ml    Intake/Output this shift: Total I/O In: 240 [P.O.:240] Out: -   Labs: Results for orders placed or performed during the hospital encounter of 02/01/20  CBC  Result Value Ref Range   WBC 14.5 (H) 4.0 - 10.5 K/uL   RBC 3.75 (L) 3.87 - 5.11 MIL/uL   Hemoglobin 11.3 (L) 12.0 - 15.0 g/dL   HCT 34.8 (L) 36 - 46 %   MCV 92.8 80.0 - 100.0 fL   MCH 30.1 26.0 - 34.0 pg   MCHC 32.5 30.0 - 36.0 g/dL   RDW 12.0 11.5 - 15.5 %   Platelets 234 150 - 400 K/uL   nRBC 0.0 0.0 - 0.2 %  Basic metabolic panel  Result Value Ref Range   Sodium 137 135 - 145 mmol/L   Potassium 3.8 3.5 - 5.1 mmol/L   Chloride 101 98 - 111 mmol/L   CO2 25 22 - 32 mmol/L   Glucose, Bld 148 (H) 70 - 99 mg/dL   BUN 14 6 - 20 mg/dL   Creatinine, Ser 0.63 0.44 - 1.00 mg/dL   Calcium 8.7 (L) 8.9 - 10.3 mg/dL   GFR calc non Af Amer >60 >60 mL/min   GFR calc Af Amer >60 >60 mL/min   Anion gap 11 5 - 15  CBC  Result Value Ref Range   WBC 17.8 (H) 4.0 - 10.5 K/uL   RBC 3.49 (L) 3.87 - 5.11 MIL/uL   Hemoglobin 10.6 (L) 12.0 - 15.0 g/dL   HCT 32.2 (L) 36 - 46 %   MCV 92.3 80.0 - 100.0 fL   MCH 30.4 26.0 - 34.0 pg   MCHC 32.9 30.0 - 36.0 g/dL   RDW 12.0 11.5 - 15.5 %   Platelets 253 150 - 400 K/uL   nRBC 0.0 0.0 - 0.2 %     Exam - ABD soft Sensation intact distally Intact pulses distally Dorsiflexion/Plantar flexion intact Compartment soft Dressing/Incision - clean, dry Motor function intact - moving foot and toes well on exam. Straight leg raising with minimal assist; patient states weak due to pain  Assessment/Plan: 2 Days Post-Op Procedure(s) (LRB): TOTAL KNEE ARTHROPLASTY (Left)  Up with therapy Discharge home with home health if PT goals met Past Medical History:  Diagnosis Date  . Anxiety   . Arthritis   . Depression   . Essential hypertension, benign 12/08/2016  . GERD (gastroesophageal reflux disease)   . Heart murmur    AS A CHILD   . High cholesterol 12/08/2016  . Hyperlipidemia   . Hypertension   . PONV (postoperative nausea and vomiting)   . Sleep apnea    DOES NOT USE CPAP   . Stroke ([place])    HX OF MINI STROKE 2016   . TIA (transient ischemic  attack) 2017    DVT Prophylaxis - ASA bid Protocol Weight-Bearing as tolerated to left leg  BLAIR Evanny Ellerbe 02/03/2020, 9:13 AM   Patient ID: Catalina Lunger, female   DOB: 1961/04/18, 59 y.o.   MRN: [id number]

## 2020-02-03 NOTE — Discharge Summary (Signed)
Patient ID: Jenna Hancock MRN: [id number] DOB/AGE: [birth year] 59 y.o.  Admit date: 02/01/2020 Discharge date: 02/03/2020  Admission Diagnoses:  Active Problems:   Left knee DJD   Discharge Diagnoses:  Same  Past Medical History:  Diagnosis Date  . Anxiety   . Arthritis   . Depression   . Essential hypertension, benign 12/08/2016  . GERD (gastroesophageal reflux disease)   . Heart murmur    AS A CHILD   . High cholesterol 12/08/2016  . Hyperlipidemia   . Hypertension   . PONV (postoperative nausea and vomiting)   . Sleep apnea    DOES NOT USE CPAP   . Stroke ([place])    HX OF MINI STROKE 2016   . TIA (transient ischemic attack) 2017    Surgeries: Procedure(s): TOTAL KNEE ARTHROPLASTY on 02/01/2020   Consultants:   Discharged Condition: Improved  Hospital Course: Jenna Hancock is an 59 y.o. female who was admitted 02/01/2020 for operative treatment of<principal problem not specified>. Patient has severe unremitting pain that affects sleep, daily activities, and work/hobbies. After pre-op clearance the patient was taken to the operating room on 02/01/2020 and underwent  Procedure(s): TOTAL KNEE ARTHROPLASTY.    Patient was given perioperative antibiotics:  Anti-infectives (From admission, onward)   Start     Dose/Rate Route Frequency Ordered Stop   02/01/20 1400  ceFAZolin (ANCEF) IVPB 2g/100 mL premix        2 g 200 mL/hr over 30 Minutes Intravenous Every 6 hours 02/01/20 1331 02/02/20 0320   02/01/20 0919  polymyxin B 500,000 Units, bacitracin 50,000 Units in sodium chloride 0.9 % 500 mL irrigation  Status:  Discontinued          As needed 02/01/20 0919 02/01/20 1330   02/01/20 0615  ceFAZolin (ANCEF) IVPB 2g/100 mL premix        2 g 200 mL/hr over 30 Minutes Intravenous On call to O.R. 02/01/20 3086 02/01/20 5784       Patient was given sequential compression devices, early ambulation, and chemoprophylaxis to prevent DVT.  Patient benefited maximally from  hospital stay and there were no complications.    Recent vital signs:  Patient Vitals for the past 24 hrs:  BP Temp Temp src Pulse Resp SpO2  02/03/20 0907 -- -- -- -- -- 95 %  02/03/20 0514 138/64 98.3 F (36.8 C) Oral 68 17 96 %  02/03/20 0109 (!) 157/61 98.4 F (36.9 C) Oral 76 16 --  02/02/20 2238 (!) 189/71 (!) 100.9 F (38.3 C) Oral 82 16 92 %  02/02/20 1938 -- -- -- -- -- 92 %  02/02/20 1330 (!) 159/60 98.2 F (36.8 C) Oral 79 16 93 %     Recent laboratory studies:  Recent Labs    02/02/20 0300 02/03/20 0245  WBC 14.5* 17.8*  HGB 11.3* 10.6*  HCT 34.8* 32.2*  PLT 234 253  NA 137  --   K 3.8  --   CL 101  --   CO2 25  --   BUN 14  --   CREATININE 0.63  --   GLUCOSE 148*  --   CALCIUM 8.7*  --      Discharge Medications:     Diagnostic Studies: DG Knee 2 Views Left  Result Date: 02/01/2020 CLINICAL DATA:  Left knee replacement. EXAM: LEFT KNEE - 1-2 VIEW COMPARISON:  None. FINDINGS: The left knee demonstrates a total knee arthroplasty without evidence of hardware failure or complication. There is expected intra-articular  air. There is no fracture or dislocation. The alignment is anatomic. Post-surgical changes noted in the surrounding soft tissues. IMPRESSION: 1. Left total knee arthroplasty without evidence of acute postoperative complication. Electronically Signed   By: Titus Dubin M.D.   On: 02/01/2020 12:49    Disposition:      Follow-up Information    Susa Day, MD Follow up in 2 week(s).   Specialty: Orthopedic Surgery Why: call for appointment Contact information: [address] [place] [address] [phone]                Signed: Nehemiah Massed 02/03/2020, 9:42 AM [phone]

## 2020-08-01 ENCOUNTER — Encounter (INDEPENDENT_AMBULATORY_CARE_PROVIDER_SITE_OTHER): Payer: Self-pay | Admitting: *Deleted

## 2020-10-16 ENCOUNTER — Encounter (INDEPENDENT_AMBULATORY_CARE_PROVIDER_SITE_OTHER): Payer: Self-pay | Admitting: *Deleted

## 2020-10-16 ENCOUNTER — Encounter (INDEPENDENT_AMBULATORY_CARE_PROVIDER_SITE_OTHER): Payer: Self-pay

## 2020-10-16 ENCOUNTER — Telehealth (INDEPENDENT_AMBULATORY_CARE_PROVIDER_SITE_OTHER): Payer: Self-pay

## 2020-10-16 ENCOUNTER — Other Ambulatory Visit (INDEPENDENT_AMBULATORY_CARE_PROVIDER_SITE_OTHER): Payer: Self-pay

## 2020-10-16 DIAGNOSIS — Z1211 Encounter for screening for malignant neoplasm of colon: Secondary | ICD-10-CM

## 2020-10-16 MED ORDER — PEG 3350-KCL-NA BICARB-NACL 420 G PO SOLR: 4000.0000 mL | ORAL | 0 refills | Status: DC

## 2020-10-16 NOTE — Telephone Encounter (Signed)
Jenna Hancock, CMA  

## 2020-10-16 NOTE — Telephone Encounter (Signed)
Ok to schedule.  Thanks,  Lakecia Deschamps Castaneda Mayorga, MD Gastroenterology and Hepatology [facility]  

## 2020-10-16 NOTE — Telephone Encounter (Signed)
Referring MD/PCP: Earp  Procedure: Tcs  Reason/Indication:  Screening  Has patient had this procedure before?  yes  If so, when, by whom and where? 10 yrs ago    Is there a family history of colon cancer?  no  Who?  What age when diagnosed?    Is patient diabetic? If yes, Type 1 or Type 2   no      Does patient have prosthetic heart valve or mechanical valve?  no  Do you have a pacemaker/defibrillator?  no  Has patient ever had endocarditis/atrial fibrillation? no  Have you had a stroke/heart attack last 6 mths? no  Does patient use oxygen? no  Has patient had joint replacement within last 12 months?  yes  Is patient constipated or do they take laxatives? no  Does patient have a history of alcohol/drug use?  no  Is patient on blood thinner such as Coumadin, Plavix and/or Aspirin? yes  Do you take medicine for weight loss?  no  For female patients,: do you still have your menstrual cycle? no  Medications: one a day vit, fish oil bid, zinc daily, vit c daily, asa 325 mg daily, hydrocodone 10/325 mg bid, pantoprazole 40 mg  Daily, irbesartan 300/12.5 mg daily, amlodipine 2.5 mg bid, atorvastatin 80 mg daily, ezetimibe 10 mg daily, metoprolol 25 mg bid, fluoxetine 40 mg daily  Allergies: Sulfur  Medication Adjustment per Dr Rehman/Dr Jenetta Downer Hold Asa 325 mg 5 days prior  Procedure date & time: 11/06/20 at 9:30 am

## 2020-11-05 ENCOUNTER — Other Ambulatory Visit: Payer: Self-pay

## 2020-11-05 ENCOUNTER — Encounter (INDEPENDENT_AMBULATORY_CARE_PROVIDER_SITE_OTHER): Payer: Self-pay

## 2020-11-05 ENCOUNTER — Other Ambulatory Visit (HOSPITAL_COMMUNITY)
Admission: RE | Admit: 2020-11-05 | Discharge: 2020-11-05 | Disposition: A | Discharge status: Home / Self Care | Payer: Medicare PPO | Source: Ambulatory Visit | Attending: Gastroenterology | Admitting: Gastroenterology

## 2020-11-05 DIAGNOSIS — Z20822 Contact with and (suspected) exposure to covid-19: Secondary | ICD-10-CM | POA: Diagnosis not present

## 2020-11-05 DIAGNOSIS — Z01812 Encounter for preprocedural laboratory examination: Secondary | ICD-10-CM | POA: Insufficient documentation

## 2020-11-05 LAB — BASIC METABOLIC PANEL
Anion gap: 7 (ref 5–15)
BUN: 20 mg/dL (ref 6–20)
CO2: 25 mmol/L (ref 22–32)
Calcium: 9.3 mg/dL (ref 8.9–10.3)
Chloride: 105 mmol/L (ref 98–111)
Creatinine, Ser: 0.61 mg/dL (ref 0.44–1.00)
GFR, Estimated: >60 mL/min (ref >60)
Glucose, Bld: 125 mg/dL — ABNORMAL HIGH (ref 70–99)
Potassium: 4.1 mmol/L (ref 3.5–5.1)
Sodium: 137 mmol/L (ref 135–145)

## 2020-11-05 LAB — SARS CORONAVIRUS 2 (TAT 6-24 HRS): SARS Coronavirus 2: NEGATIVE

## 2020-12-04 ENCOUNTER — Other Ambulatory Visit (HOSPITAL_COMMUNITY): Payer: Medicare PPO

## 2020-12-05 ENCOUNTER — Ambulatory Visit (HOSPITAL_COMMUNITY): Admission: RE | Admit: 2020-12-05 | Payer: Medicare PPO | Source: Home / Self Care | Admitting: Gastroenterology

## 2020-12-05 SURGERY — COLONOSCOPY WITH PROPOFOL
Anesthesia: Monitor Anesthesia Care

## 2020-12-13 ENCOUNTER — Telehealth: Payer: Self-pay | Admitting: Internal Medicine

## 2020-12-13 MED ORDER — PREDNISONE 10 MG PO TABS: 10.0000 mg | ORAL_TABLET | Freq: Every day | ORAL | 0 refills | Status: DC

## 2020-12-13 NOTE — Telephone Encounter (Signed)
Primary Pulmonologist: Dr. Jamey Reas Last office visit and with whom: 08/17/2018 with MR What do we see them for (pulmonary problems): IPF, Respiratory bronchiolitis intersitial lung disease Last OV assessment/plan: see below  Was appointment offered to patient (explain)?    Glad much better Glad significant inroads into quitting smoking   Plan  - cotninue chantix for another 3 months without a single puff of cigarette before we taper to off  - do repeat HRCT in 3 months  Provided you are smoke free -if repeat HRCT also shows coronary artery calcification - then we can look at cardiology referral - Please talk to PCP Pomposini, Cherly Anderson, MD -  and ensure you get  shingrix ([place]) inactivated vaccine against shingles     Folllwup 3 months but after CT chest    Reason for call: I called and spoke with patient. Patient stated has been sick since 12/01/20. Started out as laryngitis then went to sinus infection. Ran a fever 103.4 on 12/10/20 [facility] and [facility] and was told have PNA in both lungs. Was given Zpak and Levaquin. Patient is still feeling terrible, SOB, cough with brown looking mucous, fever staying around 101 and chest tightness. Is also using albuterol rescue. Wanting recs to feel better. Will route to Dr. Erin Fulling as MR is off today. Patient has not been seen in clininc since 08/2018.  Dr. Erin Fulling, please advise. Thanks!  (examples of things to ask: : When did symptoms start? Fever? Cough? Productive? Color to sputum? More sputum than usual? Wheezing? Have you needed increased oxygen? Are you taking your respiratory medications? What over the counter measures have you tried?)  Allergies  Allergen Reactions   Bactrim Rash   Sulfa Antibiotics Rash    Immunization History  Administered Date(s) Administered   Influenza Inj Mdck Quad With Preservative 04/05/2018   Influenza,inj,Quad PF,6+ Mos 04/16/2014, 04/16/2017   Influenza,inj,quad, With  Preservative 04/20/2017   Influenza-Unspecified 04/25/2015   Tdap 02/16/2012   Zoster Recombinat (Shingrix) 03/08/2018

## 2020-12-13 NOTE — Telephone Encounter (Signed)
I called and spoke with patient regarding Dr. Walden Field. Patient verbalized understanding and I sent in pred taper to preferred pharmacy. Patient will go to the ED if not better and will call the office as well when feeling better to make a f/u appt as patient has not been seen in the office since 03/20. Nothing further needed.

## 2020-12-16 ENCOUNTER — Emergency Department (HOSPITAL_COMMUNITY): Payer: Medicare PPO

## 2020-12-16 ENCOUNTER — Encounter (HOSPITAL_COMMUNITY): Payer: Self-pay | Admitting: Emergency Medicine

## 2020-12-16 ENCOUNTER — Emergency Department (HOSPITAL_COMMUNITY)
Admission: EM | Admit: 2020-12-16 | Discharge: 2020-12-16 | Disposition: A | Discharge status: Home / Self Care | Payer: Medicare PPO | Attending: Emergency Medicine | Admitting: Emergency Medicine

## 2020-12-16 ENCOUNTER — Other Ambulatory Visit: Payer: Self-pay

## 2020-12-16 DIAGNOSIS — J189 Pneumonia, unspecified organism: Secondary | ICD-10-CM | POA: Diagnosis not present

## 2020-12-16 DIAGNOSIS — Z79899 Other long term (current) drug therapy: Secondary | ICD-10-CM | POA: Insufficient documentation

## 2020-12-16 DIAGNOSIS — Z96653 Presence of artificial knee joint, bilateral: Secondary | ICD-10-CM | POA: Diagnosis not present

## 2020-12-16 DIAGNOSIS — F17210 Nicotine dependence, cigarettes, uncomplicated: Secondary | ICD-10-CM | POA: Insufficient documentation

## 2020-12-16 DIAGNOSIS — I10 Essential (primary) hypertension: Secondary | ICD-10-CM | POA: Insufficient documentation

## 2020-12-16 DIAGNOSIS — Z7982 Long term (current) use of aspirin: Secondary | ICD-10-CM | POA: Insufficient documentation

## 2020-12-16 DIAGNOSIS — J069 Acute upper respiratory infection, unspecified: Secondary | ICD-10-CM | POA: Diagnosis not present

## 2020-12-16 DIAGNOSIS — R059 Cough, unspecified: Secondary | ICD-10-CM | POA: Diagnosis present

## 2020-12-16 LAB — CBC
HCT: 37.7 % (ref 36.0–46.0)
Hemoglobin: 12.1 g/dL (ref 12.0–15.0)
MCH: 30.2 pg (ref 26.0–34.0)
MCHC: 32.1 g/dL (ref 30.0–36.0)
MCV: 94 fL (ref 80.0–100.0)
Platelets: 328 10*3/uL (ref 150–400)
RBC: 4.01 MIL/uL (ref 3.87–5.11)
RDW: 12.8 % (ref 11.5–15.5)
WBC: 16.8 10*3/uL — ABNORMAL HIGH (ref 4.0–10.5)
nRBC: 0 % (ref 0.0–0.2)

## 2020-12-16 LAB — BASIC METABOLIC PANEL
Anion gap: 10 (ref 5–15)
BUN: 18 mg/dL (ref 6–20)
CO2: 28 mmol/L (ref 22–32)
Calcium: 9.2 mg/dL (ref 8.9–10.3)
Chloride: 101 mmol/L (ref 98–111)
Creatinine, Ser: 0.79 mg/dL (ref 0.44–1.00)
GFR, Estimated: >60 mL/min (ref >60)
Glucose, Bld: 131 mg/dL — ABNORMAL HIGH (ref 70–99)
Potassium: 4.1 mmol/L (ref 3.5–5.1)
Sodium: 139 mmol/L (ref 135–145)

## 2020-12-16 MED ORDER — LIDOCAINE VISCOUS HCL 2 % MT SOLN: 5.0000 mL | OROMUCOSAL | 0 refills | Status: AC | PRN

## 2020-12-16 MED ORDER — IPRATROPIUM-ALBUTEROL 20-100 MCG/ACT IN AERS
2.0000 | INHALATION_SPRAY | Freq: Four times a day (QID) | RESPIRATORY_TRACT | Status: DC
  Administered 2020-12-16: 2 via RESPIRATORY_TRACT
  Filled 2020-12-16: qty 4

## 2020-12-16 NOTE — Discharge Instructions (Addendum)
Imaging and lab work looks reassuring.  Suspect you are continuing with a viral-like process.  When she continue with the steroids I prescribed to you, I have also given you a inhaler please use every 4-6 hours 1 to 2 puffs as needed for shortness of breath.  You may use over-the-counter decongestions which often help as well.  Please follow-up with your pulmonologist for further evaluation.  Come back to the emergency department if you develop chest pain, shortness of breath, severe abdominal pain, uncontrolled nausea, vomiting, diarrhea.

## 2020-12-16 NOTE — ED Provider Notes (Signed)
[facility] EMERGENCY DEPARTMENT Provider Note   CSN: [id number] Arrival date & time: 12/16/20  1109     History Chief Complaint  Patient presents with   Pneumonia    Jenna Hancock is a 60 y.o. female.  HPI  Patient with significant medical history of hypertension, stroke, TIA presents with chief complaint of pneumonia.  Patient states she was diagnosed with pneumonia on Tuesday, started on azithromycin as well as Levaquin, states she has been tolerating medication well.  She spoke with her pulmonologist and was started on a prednisone taper pack.  Patient states that she continues to have full body aches, runny nose, states that her fevers have improved but she still has a slight fever max temperature over last 2 days was 100.5.  Patient states that she has a productive cough, states her chest feels tight, and will occasionally feel short of breath on exertion.  She denies chest pain, becoming diaphoretic, nausea, vomiting, lightheaded or dizziness, she denies  leg swelling, leg pain, has no history of PEs or DVTs, currently not on hormone therapy.  Patient denies  alleviating factors.  Patient endorses that she is up-to-date on her COVID and her influenza vaccines, she tested negative for the flu, COVID, strep test.  Tolerating p.o. without difficulty.  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   Cigarette smoker 06/13/2017   Asthmatic bronchitis with acute exacerbation 06/11/2017   Essential hypertension, benign 12/08/2016    High cholesterol 12/08/2016   Obstructive sleep apnea 06/25/2014    Past Surgical History:  Procedure Laterality Date   APPENDECTOMY     JOINT REPLACEMENT     rt knee 11 yrs ago   KNEE ARTHROSCOPY Left    NASAL SINUS SURGERY     TOTAL KNEE ARTHROPLASTY Left 02/01/2020   Procedure: TOTAL KNEE ARTHROPLASTY;  Surgeon: Susa Day, MD;  Location: [facility];  Service: Orthopedics;  Laterality: Left;  general or spinal     OB History     Gravida      Para      Term      Preterm      AB      Living  2      SAB      IAB      Ectopic      Multiple      Live Births              Family History  Problem Relation Age of Onset   Diabetes Father    Stroke Father    Heart disease Father    Lung cancer Mother     Social History   Tobacco Use   Smoking status: Light Smoker    Packs/day: 0.25    Years: 34.00    Pack years: 8.50    Types: Cigarettes   Smokeless tobacco: Never   Tobacco comments:  Pt on chantix  Vaping Use   Vaping Use: Never used  Substance Use Topics   Alcohol use: No    Alcohol/week: 0.0 standard drinks    Comment: (maybe one drink 3x a year)   Drug use: No    Home Medications Prior to Admission medications   Medication Sig Start Date End Date Taking? Authorizing Provider  magic mouthwash (lidocaine, diphenhydrAMINE, alum & mag hydroxide) suspension Swish and spit 5 mLs as needed for up to 5 days for mouth pain. 12/16/20 12/21/20 Yes Marcello Fennel, PA-C  acetaminophen (TYLENOL) 325 MG tablet Take 1-2 tablets (325-650 mg total) by mouth every 6 (six) hours as needed for mild pain (pain score 1-3 or temp > 100.5). Patient not taking: Reported on 10/30/2020 02/03/20   Benedetto Goad, PA-C  albuterol (PROVENTIL HFA;VENTOLIN HFA) [phone] Base) MCG/ACT inhaler Inhale 1-2 puffs into the lungs 4 (four) times daily as needed (wheezing/shortness of breath.).     [provider]  ALPRAZolam Duanne Moron) 0.5 MG tablet Take 0.5 mg by mouth 2  (two) times daily as needed for anxiety.     [provider]  alum & mag hydroxide-simeth (MAALOX/MYLANTA) 200-200-20 MG/5ML suspension Take 30 mLs by mouth every 4 (four) hours as needed for indigestion. Patient not taking: Reported on 10/30/2020 02/03/20   Benedetto Goad, PA-C  amLODipine (NORVASC) 5 MG tablet Take 5 mg by mouth 2 (two) times daily.    [provider]  aspirin 325 MG tablet Take 325 mg by mouth daily.    [provider]  atorvastatin (LIPITOR) 80 MG tablet Take 80 mg by mouth daily.    [provider]  Calcium Carb-Cholecalciferol (CALTRATE 600+D3 PO) Take 1 tablet by mouth daily.    [provider]  docusate sodium (COLACE) 100 MG capsule Take 1 capsule (100 mg total) by mouth 2 (two) times daily. Patient not taking: Reported on 10/30/2020 02/03/20   Benedetto Goad, PA-C  ezetimibe (ZETIA) 10 MG tablet Take 10 mg by mouth every evening. 11/24/19   [provider]  FLUoxetine (PROZAC) 40 MG capsule Take 40 mg by mouth daily. 12/20/19   [provider]  folic acid (FOLVITE) 539 MCG tablet Take 400 mcg by mouth daily.    [provider]  gabapentin (NEURONTIN) 300 MG capsule Take 300 mg by mouth daily. 10/25/20   [provider]  HYDROcodone-acetaminophen (NORCO) 10-325 MG tablet Take 1 tablet by mouth every 8 (eight) hours.    [provider]  irbesartan-hydrochlorothiazide (AVALIDE) 300-12.5 MG per tablet Take 1 tablet by mouth daily.    [provider]  methocarbamol (ROBAXIN) 500 MG tablet Take 1 tablet (500 mg total) by mouth every 6 (six) hours as needed for muscle spasms. 02/03/20   Benedetto Goad, PA-C  metoprolol tartrate (LOPRESSOR) 25 MG tablet Take 25 mg by mouth 2 (two) times daily. 11/15/19   [provider]  minocycline (MINOCIN) 50 MG capsule Take 50 mg by mouth daily as needed (facial breakouts.).  11/25/19   [provider]  Multiple Vitamins-Minerals  (MULTIVITAMIN WITH MINERALS) tablet Take 1 tablet by mouth daily.    [provider]  Omega 3 1200 MG CAPS Take 1,200 mg by mouth daily.    [provider]  ondansetron (ZOFRAN) 4 MG tablet Take 1 tablet (4 mg total) by mouth every 6 (six) hours as needed for nausea. Patient not taking: Reported on 10/30/2020 02/03/20   Benedetto Goad, PA-C  pantoprazole (PROTONIX) 40 MG tablet Take 30- 60 min before your first and last meals of the day Patient taking differently: Take 40 mg by mouth daily. Take 30- 60 min before your first and last meals of the day 06/11/17   Tanda Rockers, MD  polyethylene glycol (MIRALAX / GLYCOLAX) 17 g packet Take 17 g by mouth daily. Patient not taking: Reported on 10/30/2020 02/04/20   Benedetto Goad, PA-C  polyethylene glycol-electrolytes (TRILYTE) 420 g solution Take 4,000 mLs by mouth as directed. 10/16/20   Harvel Quale, MD  predniSONE (DELTASONE) 10 MG tablet Take 1 tablet (10 mg total) by mouth daily with breakfast. Prednisone 40 mg for 3 days, 30 mg for 3 days, 20 mg x 3 days and 10 mg for 3 days then stop. 12/13/20   Freddi Starr, MD  predniSONE (STERAPRED UNI-PAK 21 TAB) 5 MG (21) TBPK tablet Take 10 mg by mouth daily. Taper pack 10/25/20   [provider]  varenicline (CHANTIX) 0.5 MG tablet - Days 1-3: 0.5 mg once daily- Days 4-7: 0.5 mg twice daily- Maintenance dose:  1 mg twice daily for 11 weeks Patient not taking: Reported on 10/30/2020 11/23/17   Brand Males, MD    Allergies    Bactrim and Sulfa antibiotics  Review of Systems   Review of Systems  Constitutional:  Positive for fever. Negative for chills.  HENT:  Positive for congestion and sore throat. Negative for trouble swallowing.   Respiratory:  Positive for cough and shortness of breath.   Cardiovascular:  Negative for chest pain.  Gastrointestinal:  Negative for abdominal pain, diarrhea, nausea and vomiting.  Genitourinary:  Negative for enuresis.   Musculoskeletal:  Positive for myalgias.  Skin:  Negative for rash.  Neurological:  Positive for headaches. Negative for dizziness.  Hematological:  Does not bruise/bleed easily.   Physical Exam Updated Vital Signs BP 139/61   Pulse (!) 59   Temp 98.2 F (36.8 C)   Resp 18   Ht 5\' 4"  (1.626 m)   Wt 99.8 kg   SpO2 95%   BMI 37.76 kg/m   Physical Exam Vitals and nursing note reviewed.  Constitutional:      General: She is not in acute distress.    Appearance: She is not ill-appearing.  HENT:     Head: Normocephalic and atraumatic.     Nose: No congestion.     Mouth/Throat:     Mouth: Mucous membranes are moist.     Pharynx: Oropharynx is clear. No oropharyngeal exudate or posterior oropharyngeal erythema.  Eyes:     Conjunctiva/sclera: Conjunctivae normal.  Cardiovascular:     Rate and Rhythm: Normal rate and regular rhythm.     Pulses: Normal pulses.     Heart sounds: No murmur heard.   No friction rub. No gallop.  Pulmonary:     Effort: No respiratory distress.     Breath sounds: Wheezing present. No rhonchi or rales.     Comments: Patient has a slight tight sounding chest, with intermittent wheezing heard mainly in the upper lobe of the left lung, no signs of respiratory distress, vital signs reassuring. Musculoskeletal:     Right lower leg: No edema.     Left lower leg: No edema.  Skin:    General: Skin is warm and dry.  Neurological:     Mental Status: She is alert.  Psychiatric:        Mood and Affect: Mood normal.    ED  Results / Procedures / Treatments   Labs (all labs ordered are listed, but only abnormal results are displayed) Labs Reviewed  BASIC METABOLIC PANEL - Abnormal; Notable for the following components:      Result Value   Glucose, Bld 131 (*)    All other components within normal limits  CBC - Abnormal; Notable for the following components:   WBC 16.8 (*)    All other components within normal limits    EKG None  Radiology DG  Chest 2 View  Result Date: 12/16/2020 CLINICAL DATA:  Pneumonia. EXAM: CHEST - 2 VIEW COMPARISON:  06/11/2017 FINDINGS: Lungs are hyperexpanded. Interstitial markings are diffusely coarsened with chronic features. Patchy opacities are noted in the lungs bilaterally with a nodular character. Insert no effusion The cardiopericardial silhouette is within normal limits for size. The visualized bony structures of the thorax show no acute abnormality. IMPRESSION: Patchy bilateral airspace opacities with a nodular character. CT chest without contrast recommended to further evaluate. Electronically Signed   By: Misty Stanley M.D.   On: 12/16/2020 12:35   CT Chest Wo Contrast  Result Date: 12/16/2020 CLINICAL DATA:  Pneumonia. EXAM: CT CHEST WITHOUT CONTRAST TECHNIQUE: Multidetector CT imaging of the chest was performed following the standard protocol without IV contrast. COMPARISON:  Radiograph same day FINDINGS: Cardiovascular: No significant vascular findings. Normal heart size. No pericardial effusion. Scattered calcifications of the thoracic aorta. Mediastinum/Nodes: Paratracheal lymph nodes are prominent but not pathologically enlarged 6 7 mm. Supraclavicular nodes. Lungs/Pleura: There is bilateral patchy ground-glass opacities within the upper and lower lobes. No discrete nodularity. No pleural fluid. Airways normal. Upper Abdomen: Limited view of the liver, kidneys, pancreas are unremarkable. Normal adrenal glands. Musculoskeletal: No aggressive osseous lesion. IMPRESSION: 1. Bilateral patchy ground-glass opacities without consolidation or nodularity. Nonspecific finding with differential including pulmonary edema, multifocal atypical pneumonia including viral pneumonia or pneumonitis. 2. Small paratracheal lymph nodes are presumably reactive. Electronically Signed   By: Suzy Bouchard M.D.   On: 12/16/2020 13:52    Procedures Procedures   Medications Ordered in ED Medications  Ipratropium-Albuterol  (COMBIVENT) respimat 2 puff (2 puffs Inhalation Given 12/16/20 1555)    ED Course  I have reviewed the triage vital signs and the nursing notes.  Pertinent labs & imaging results that were available during my care of the patient were reviewed by me and considered in my medical decision making (see chart for details).    MDM Rules/Calculators/A&P                         Initial impression-patient presents with pneumonia.  She is alert, does not appear in acute stress, vital signs reassuring.  Triage obtain basic lab work-up, will provide patient with Combivent, ambulate patient and reassess.  Work-up-CBC shows leukocytosis of 16.8, BMP shows hyperglycemia 131, chest x-ray reveals patchy bilateral airspace opacities with nodule characteristics recommend CT for further evaluation, CT chest reveals bilateral patchy ground glass opacities without consolidation or nodule allergy nonspecific findings possible pulmonary edema versus atypical pneumonia, pneumonitis.  Reassessment-patient was assessed after Combivent, she was ambulated, O2 sats dipped down to 93, lung sounds were reassessed tight sounding chest had improved, no more wheezing present.  Patient is in agreement for discharge at this time.  Rule out-Low suspicion for systemic infection as patient is nontoxic-appearing, vital signs reassuring, no obvious source infection noted on exam.  There is no the patient has a leukocytosis of 16.8 by suspect this secondary due to  steroids that she has been on a steroid pack for the last 3 days.  Low suspicion for pneumonia as there is no noted rhonchi or rails heard on my exam, imaging negative for consolidation consistent with pneumonia I suspect patient setting from a more viral process.  I have low suspicion for PE as patient denies pleuritic chest pain, there is no unilateral leg swelling, patient is low risk factors, vital signs reassuring.  Low suspicion for CHF exacerbation as there is no signs of fluid  overload on my exam.  Low suspicion for strep throat as oropharynx was visualized, no erythema or exudates noted.  Low suspicion patient would need  hospitalized due to viral infection or Covid as vital signs reassuring, patient is not in respiratory distress.    Plan-  URI-like symptoms-suspect patient suffering from a viral process, will have her continue with steroids and inhalers.  Have her follow-up with pulmonology for further evaluation.    Vital signs have remained stable, no indication for hospital admission.  Patient discussed with attending and they agreed with assessment and plan.  Patient given at home care as well strict return precautions.  Patient verbalized that they understood agreed to said plan.  Final Clinical Impression(s) / ED Diagnoses Final diagnoses:  Viral upper respiratory tract infection    Rx / DC Orders ED Discharge Orders          Ordered    magic mouthwash (lidocaine, diphenhydrAMINE, alum & mag hydroxide) suspension  As needed        12/16/20 1656             Marcello Fennel, PA-C 12/16/20 1657    Hayden Rasmussen, MD 12/17/20 1052

## 2020-12-16 NOTE — ED Notes (Signed)
Pt ambulated on RA. Sats dropped from 97 to 92-93%. EDP aware.

## 2020-12-16 NOTE — ED Triage Notes (Signed)
Pt to the ED POV  after being diagnosed with Pneumonia last Tuesday. Pt was seen by [facility] and was told to come back if she did not feel any better.  The pt states she has had several negative covid tests.

## 2020-12-16 NOTE — ED Notes (Signed)
Respiratory called to bring Combivent inhaler.

## 2021-02-15 ENCOUNTER — Encounter: Payer: Self-pay | Admitting: Internal Medicine

## 2021-02-15 ENCOUNTER — Telehealth: Payer: Self-pay | Admitting: Internal Medicine

## 2021-02-15 ENCOUNTER — Ambulatory Visit: Payer: Medicare PPO | Admitting: Internal Medicine

## 2021-02-15 ENCOUNTER — Other Ambulatory Visit: Payer: Self-pay

## 2021-02-15 VITALS — BP 118/62 | HR 60 | Temp 98.2°F | Ht 64.0 in | Wt 212.4 lb

## 2021-02-15 DIAGNOSIS — F17210 Nicotine dependence, cigarettes, uncomplicated: Secondary | ICD-10-CM

## 2021-02-15 DIAGNOSIS — I2510 Atherosclerotic heart disease of native coronary artery without angina pectoris: Secondary | ICD-10-CM

## 2021-02-15 DIAGNOSIS — J84115 Respiratory bronchiolitis interstitial lung disease: Secondary | ICD-10-CM | POA: Diagnosis not present

## 2021-02-15 DIAGNOSIS — R9389 Abnormal findings on diagnostic imaging of other specified body structures: Secondary | ICD-10-CM

## 2021-02-15 MED ORDER — VARENICLINE TARTRATE 0.5 MG X 11 & 1 MG X 42 PO MISC: ORAL | 0 refills | Status: DC

## 2021-02-15 NOTE — Telephone Encounter (Signed)
  For radiology addendum:   - please call radiologist assistant line [phone] and specify ONISHA MENDOSA , 23-Oct-1960 and [id number] - mention imaging type CT scan of the chest in July 2022 and compared to CT scan of the chest in 2019 and date  - request addendum for purpose of -comparing bilateral upper lobe groundglass opacities/nodularity   Please send phone message back when done -this wasy I  can track  Thanks    SIGNATURE    Dr. Brand Males, M.D., F.C.C.P,  Pulmonary and Critical Care Medicine Staff Physician, [place] Director - Interstitial Lung Disease  Program  [facility] [place] [facility] [place], [place], [address]  Pager: [phone], If no answer  OR between  19:00-7:00h: page [phone]  [phone] Telephone (clinical office): [phone] Telephone (research): [phone]  2:52 PM 02/15/2021

## 2021-02-15 NOTE — Progress Notes (Signed)
Subjective:     Patient ID: Jenna Hancock, female   DOB: 07-Feb-1961, 60 y.o.   MRN: [id number]  HPI PCP Hancock, Jenna Anderson, MD   HPI   IOV 03/19/2017  Chief Complaint  Patient presents with   Advice Only    Abnormal cxr 01/22/17 and due to that, pt requested to come to our office for appt. C/o SOB on exertion with occ. dry cough and occ. chest tightness. Pt stated that in May and June she was cleaning her place out due to having rat infestation and became real sick then and hasn't felt the same since.   Jenna Hancock 09/08/1960 : 60 year old female whose mother Jenna Hancock that I take care of for COPD. Patient herself is a smoker greater than 30 pack per day. But at baseline she has never had any respiratory issues. [place] with her boyfriend who is here with her today Then approximately the first week of May 2018: She took it upon herself to enter and closed small room that are not being visited in 1 year. The room contained chicken feed. There was significant rat infestation. She used a leaf blower without a mass and for 3 hours guarded of the rats. During this time a lot of rat feces she inhale and was exposed to. One day after this she started having fever and wheezing and shortness of breath and feeling extremely rundown. Then on 10/26/2016 She went to the ER in May 2018 with fever and generalized body aches following tick bite 2. She did not tell the emergency room doctors about exposure to rat feces because of embarrassment.  During this time wheezing was noticed on the physical exam. She was discharged on doxycycline. It looks like she did not get prednisone. After this she reports she's been to other urgent care but did not get prednisone and steroids. Then in June 2018 she saw primary care for chronic diarrhea At this visit she reported she lost 22 pounds. C. difficile test was negative. Then in 02/01/2017 she went to the emergency department because of shortness  of breath after mowing grass on a hot humid day. In the emergency room she did not have wheezing. She was discharged after albuterol and dexamethasone taper. She tells me that till this day that she is much better but she still has wheezing and shortness of breath and feeling fatigued and is not fully better. She is extremely worried about her respiratory issues    Chest x-ray 02/01/2017: Reported a COPD without any abnormality but to me on my personal visualization is only mildly hyperinflated but otherwise clear  Blood lab work 01/22/2017: Troponin normal, BMP normal, liver function test normal and creatinine normal and white count normal  Walking desaturation test 185 feet 3 laps on room air with a full head probe: Resting heart rate 57/m. Peak heart rate 85/m. Resting pulse ox 100%. Final pulse ox 99% and she became short of breath. She did not desaturate.   Exhaled nitric oxide in the office 03/19/2017: 7 ppb  Spirometry 03/19/2017 -> fev1 1/9L/70%, Ratio 76 - office spirometry  Past medical history review shows that she's had a history of pneumonia, sinus infections and sinus nasal surgeries.      06/11/2017 acute extended ov/Jenna Hancock re:  Chief Complaint  Patient presents with   Acute Visit    Pt c/o increased SOB, wheezing, chest tightness and prod cough with minimal green sputum- onset was 6 days  ago. She had fever 1 day ago.   cough off and on since May 2018 while on breo and using lots of saba / assoc chest tight better p saba  Shoemaker eval 2 week prior to [place]  Did not rec abx  Acute worse cough with purulent sputum esp in am since 06/05/17 assoc with nasal congestion/ sore throat/    sob with watery nasal d/c  rx pred / omnicef 12/21/- 12/26 and no better    No obvious day to day or daytime variability or assoc    mucus plugs or hemoptysis or cp or  or overt   hb symptoms. No unusual exposure hx or h/o childhood pna/ asthma or knowledge of premature birth.  Sleeping ok flat  without nocturnal    exacerbation  of respiratory  c/o's or need for noct saba. Also denies any obvious fluctuation of symptoms with weather or environmental changes or other aggravating or alleviating factors except as outlined above    OV 06/25/2017  Chief Complaint  Patient presents with   Follow-up    Pt was seen by [facility] 06/11/17 and was dx with bronchitis. Pt went to ENT, Jenna Hancock and is currently on an abx. Pt has complaints of a cough with yellow mucus. Denies any SOB or CP   Jenna Hancock returns for follow-up.  She has respiratory symptoms following rat  antigen exposure in May 2018.  She continues to smoke and she continues to have respiratory symptoms.  Around Christmas 2018 she started feeling sick.  June 11, 2017 she saw my colleague Dr. Christinia Hancock who advised her to stop smoking given antibiotic and prednisone.  Did a chest x-ray that I personally visualized that shows bronchitic changes but also my opinion that might be interstitial lung disease changes.  She started feeling better with and followed up with ENT doctor Jenna Hancock who has her on prolonged antibiotic therapy right now.  Overall she is better but she feels stable.  Nevertheless she feels she is a new new low baseline.  She feels fatigued all the time.  She is frustrated with  symptomatology but she continues to smoke.   PFT 04/22/17 - normal FENO 12 ppb 06/25/2017   OV 11/23/2017  Chief Complaint  Patient presents with   Follow-up    Pt states she has been doing well since last visit and denies any complaints or concerns.    Jenna Hancock , 60 y.o. , with dob [birth year] and female ,Not Hispanic or Latino from [address] [place] [address] - presents to pulm clinic for cough and resp symptoms due to smoking and chicken coop expisure with rapid antigen.  At the end of last visit in January 2019 we did a high-resolution CT scan of the chest and this shows RB ILD findings.  Autoimmune and hypersensitivity  pneumonitis panel was negative.  Currently she says she has cut down on smoking and with this her respiratory symptoms have improved.  In addition she also has cut down exposure to the chicken coop.  In fact she has eliminated this.  She still has some symptoms that are described below on the CAT score even though she does not have COPD.  The symptoms are relatively new compared to the pre-exposure days but they are improving.  Albuterol does help this.  We discussed quitting smoking because she still continues to smoke.  Several years ago she quit with the help of Chantix for 1-1/2 years but then relapsed.  She says Chantix because her $200 a month while she is spending only 25 or $30 a month for smoking.  She is try to get a lower price on the Chantix.  Apparently a friend of hers is trying to help her with the discount program.  We did give her a discount card.       OV 08/17/2018  Subjective:  Patient ID: Jenna Hancock, female , DOB: 07/25/1960 , age 78 y.o. , MRN: [id number] , ADDRESS: [place] [address]   08/17/2018 -   Chief Complaint  Patient presents with   Follow-up    Pt states she has been doing well since last visit and denies any concerns.  FU smoking FU RB-ILD  HPI Jenna Hancock 60 y.o. -returns for follow-up.  Last seen June 2019.  With the help of Chantix she quit smoking but the remission last 3 months and approximately 2 months ago she started smoking a cigarette here and there when she tapered her Chantix down to once a day.  Now for the last 2 weeks she is gone back up on the Chantix to twice daily and smoking has been in remission.  Nevertheless these efforts have yielded significant improvement in symptoms and she feels good.  She is trying to avoid the chicken coop.  She reports no new problems.  Chart review shows that she had coronary artery calcification on the previous CT scan of the chest but she denies any chest pain or shortness of breath or wheezing or  cough.    CAT COPD Symptom & Quality of Life Score (GSK trademark) 0 is no burden. 5 is highest burden 11/23/2017   Never Cough -> Cough all the time 3  No phlegm in chest -> Chest is full of phlegm 1  No chest tightness -> Chest feels very tight 2  No dyspnea for 1 flight stairs/hill -> Very dyspneic for 1 flight of stairs 3  No limitations for ADL at home -> Very limited with ADL at home 0  Confident leaving home -> Not at all confident leaving home -0  Sleep soundly -> Do not sleep soundly because of lung condition 2  Lots of Energy -> No energy at all 2  TOTAL Score (max 40)  13    CT chest IMPRESSION: 1. Minimal patchy ground-glass centrilobular micronodularity in the upper lobes. 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 2. Mild to moderate patchy air trapping in both lungs, indicative of small airways disease. 3. One vessel coronary atherosclerosis. 4. Cholelithiasis.   Aortic Atherosclerosis (ICD10-I70.0).     Electronically Signed   By: Ilona Sorrel M.D.   On: 07/07/2017 17:08  ROS - per HPI   OV 02/15/2021  Subjective:  Patient ID: Jenna Hancock, female , DOB: 15-Feb-1961 , age 48 y.o. , MRN: [id number] , ADDRESS: [place] [id number] PCP Dannial Monarch, FNP Patient Care Team: Dannial Monarch, FNP as PCP - General (Nurse Practitioner)  This Provider for this visit: Treatment Team:  Attending Provider: Brand Males, MD    02/15/2021 -   Chief Complaint  Patient presents with   Follow-up    Pt states she has been doing okay since last visit and denies any complaints.     HPI Jenna Hancock 60 y.o. -returns for follow-up of her smoking and RB ILD upper lobe groundglass nodules.  Last CT scan was in 2019.  She  had a CT scan of the chest in 2022.  Radiology did not compare but according to the report it seems similar without any change.  She is denying any chest  pain or shortness of breath or coughing or wheezing.  She is relapsed into smoking.  She wants another Chantix prescription if it still available on the market.  I will make this.  She denies any suicidal or homicidal ideations.  Denies any nightmares prior with Chantix.  No firearms in the house.  Of note her mother had COVID.  Be treated with antiviral.  Now she saying the mother has green nasal drainage for the last day.  She is wondering about antibiotics because the mother is also coughing more from the nasal drainage.  I made a separate phone note about that for Cindee Salt.    CT Chest data 7/322  Narrative & Impression  CLINICAL DATA:  Pneumonia.   EXAM: CT CHEST WITHOUT CONTRAST   TECHNIQUE: Multidetector CT imaging of the chest was performed following the standard protocol without IV contrast.   COMPARISON:  Radiograph same day   FINDINGS: Cardiovascular: No significant vascular findings. Normal heart size. No pericardial effusion. Scattered calcifications of the thoracic aorta.   Mediastinum/Nodes: Paratracheal lymph nodes are prominent but not pathologically enlarged 6 7 mm. Supraclavicular nodes.   Lungs/Pleura: There is bilateral patchy ground-glass opacities within the upper and lower lobes. No discrete nodularity. No pleural fluid. Airways normal.   Upper Abdomen: Limited view of the liver, kidneys, pancreas are unremarkable. Normal adrenal glands.   Musculoskeletal: No aggressive osseous lesion.   IMPRESSION: 1. Bilateral patchy ground-glass opacities without consolidation or nodularity. Nonspecific finding with differential including pulmonary edema, multifocal atypical pneumonia including viral pneumonia or pneumonitis. 2. Small paratracheal lymph nodes are presumably reactive.     Electronically Signed   By: Suzy Bouchard M.D.   On: 12/16/2020 13:52      No results found.    PFT  PFT Results Latest Ref Rng & Units 04/22/2017  FVC-Pre L  2.73  FVC-Predicted Pre % 81  FVC-Post L 2.72  FVC-Predicted Post % 81  Pre FEV1/FVC % % 78  Post FEV1/FCV % % 78  FEV1-Pre L 2.13  FEV1-Predicted Pre % 82  FEV1-Post L 2.11  DLCO uncorrected ml/min/mmHg 20.04  DLCO UNC% % 85  DLCO corrected ml/min/mmHg 20.04  DLCO COR %Predicted % 85  DLVA Predicted % 88  TLC L 5.16  TLC % Predicted % 103  RV % Predicted % 121       has a past medical history of Anxiety, Arthritis, Depression, Essential hypertension, benign (12/08/2016), GERD (gastroesophageal reflux disease), Heart murmur, High cholesterol (12/08/2016), Hyperlipidemia, Hypertension, PONV (postoperative nausea and vomiting), Sleep apnea, Stroke ([place]), and TIA (transient ischemic attack) (2017).   reports that she has been smoking cigarettes. She started smoking about 39 years ago. She has a 34.00 pack-year smoking history. She has never used smokeless tobacco.  Past Surgical History:  Procedure Laterality Date   APPENDECTOMY     JOINT REPLACEMENT     rt knee 11 yrs ago   KNEE ARTHROSCOPY Left    NASAL SINUS SURGERY     TOTAL KNEE ARTHROPLASTY Left 02/01/2020   Procedure: TOTAL KNEE ARTHROPLASTY;  Surgeon: Susa Day, MD;  Location: [facility];  Service: Orthopedics;  Laterality: Left;  general or spinal    Allergies  Allergen Reactions   Bactrim Rash   Sulfa Antibiotics Rash    Immunization History  Administered  Date(s) Administered   Influenza Inj Mdck Quad Pf 03/14/2019   Influenza Inj Mdck Quad With Preservative 04/05/2018   Influenza,inj,Quad PF,6+ Mos 04/16/2014, 04/16/2017   Influenza,inj,quad, With Preservative 04/20/2017, 04/05/2018, 03/14/2019, 04/20/2020   Influenza-Unspecified 04/25/2015   Moderna Sars-Covid-2 Vaccination 08/15/2019, 09/15/2019, 04/16/2020   Pneumococcal Polysaccharide-23 02/24/2019   Tdap 02/16/2012   Zoster Recombinat (Shingrix) 03/08/2018   Zoster, Unspecified 03/08/2018    Family History  Problem Relation Age of Onset   Diabetes  Father    Stroke Father    Heart disease Father    Lung cancer Mother      Current Outpatient Medications:    albuterol (PROVENTIL HFA;VENTOLIN HFA) 108 (90 Base) MCG/ACT inhaler, Inhale 1-2 puffs into the lungs 4 (four) times daily as needed (wheezing/shortness of breath.). , Disp: , Rfl:    ALPRAZolam (XANAX) 0.5 MG tablet, Take 0.5 mg by mouth 2 (two) times daily as needed for anxiety. , Disp: , Rfl:    amLODipine (NORVASC) 5 MG tablet, Take 5 mg by mouth 2 (two) times daily., Disp: , Rfl:    aspirin 325 MG tablet, Take 325 mg by mouth daily., Disp: , Rfl:    atorvastatin (LIPITOR) 80 MG tablet, Take 80 mg by mouth daily., Disp: , Rfl:    Calcium Carb-Cholecalciferol (CALTRATE 600+D3 PO), Take 1 tablet by mouth daily., Disp: , Rfl:    ezetimibe (ZETIA) 10 MG tablet, Take 10 mg by mouth every evening., Disp: , Rfl:    FLUoxetine (PROZAC) 40 MG capsule, Take 40 mg by mouth daily., Disp: , Rfl:    folic acid (FOLVITE) A999333 MCG tablet, Take 400 mcg by mouth daily., Disp: , Rfl:    HYDROcodone-acetaminophen (NORCO) 10-325 MG tablet, Take 1 tablet by mouth every 8 (eight) hours., Disp: , Rfl:    irbesartan-hydrochlorothiazide (AVALIDE) 300-12.5 MG per tablet, Take 1 tablet by mouth daily., Disp: , Rfl:    metoprolol tartrate (LOPRESSOR) 25 MG tablet, Take 25 mg by mouth 2 (two) times daily., Disp: , Rfl:    minocycline (MINOCIN) 50 MG capsule, Take 50 mg by mouth daily as needed (facial breakouts.). , Disp: , Rfl:    Multiple Vitamins-Minerals (MULTIVITAMIN WITH MINERALS) tablet, Take 1 tablet by mouth daily., Disp: , Rfl:    Omega 3 1200 MG CAPS, Take 1,200 mg by mouth daily., Disp: , Rfl:    pantoprazole (PROTONIX) 40 MG tablet, Take 30- 60 min before your first and last meals of the day (Patient taking differently: Take 40 mg by mouth daily. Take 30- 60 min before your first and last meals of the day), Disp: , Rfl:    varenicline (CHANTIX PAK) 0.5 MG X 11 & 1 MG X 42 tablet, Take one 0.5 mg  tablet by mouth once daily for 3 days, then increase to one 0.5 mg tablet twice daily for 4 days, then increase to one 1 mg tablet twice daily., Disp: 53 tablet, Rfl: 0      Objective:   Vitals:   02/15/21 1412  BP: 118/62  Pulse: 60  Temp: 98.2 F (36.8 C)  TempSrc: Oral  SpO2: 97%  Weight: 212 lb 6.4 oz (96.3 kg)  Height: '5\' 4"'$  (1.626 m)    Estimated body mass index is 36.46 kg/m as calculated from the following:   Height as of this encounter: '5\' 4"'$  (1.626 m).   Weight as of this encounter: 212 lb 6.4 oz (96.3 kg).  '@WEIGHTCHANGE'$ @  Filed Weights   02/15/21 1412  Weight: 212  lb 6.4 oz (96.3 kg)     Physical Exam    General: No distress. obese Neuro: Alert and Oriented x 3. GCS 15. Speech normal Psych: Pleasant Resp:  Barrel Chest - no.  Wheeze - no, Crackles - no, No overt respiratory distress CVS: Normal heart sounds. Murmurs - no Ext: Stigmata of Connective Tissue Disease - no HEENT: Normal upper airway. PEERL +. No post nasal drip        Assessment:       ICD-10-CM   1. 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 Cigarette smoker  F17.210     3. Coronary artery calcification seen on CAT scan  I25.10 Ambulatory referral to Cardiology         Plan:     Patient Instructions     ICD-10-CM   1. 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 Cigarette smoker  F17.210     3. Coronary artery calcification seen on CAT scan  I25.10         Glad much better  Glad significant inroads into quitting smoking thought need to quit fully and you have relapsed  CT scan July 2022 still with similar inflammation   CT scan also shows single vessel coronary calfictiation  Plan  - Refer cardiology for coronary artery calcification as a precaution - Aim to quit smoking -we can redo Chantix as follows  - #SMoking  - glad you want to quit and I understand you are using expired samples -  start  chantix as directed  - Days 1-3: 0.5 mg once daily  - Days 4-7: 0.5 mg twice daily  - Maintenance (? Day 8):  1 mg twice daily for 11 weeks   - if you have bad dream stop medication and call us  - take the medicaiton as instructed and after food - quit smoking  1 week after starting chantix    Follow-up - Return in 1 year or sooner if needed     SIGNATURE    Dr. Brand Males, M.D., F.C.C.P,  Pulmonary and Critical Care Medicine Staff Physician, [place] Director - Interstitial Lung Disease  Program  [facility] [place], [place], [address]  Pager: [phone], If no answer or between  15:00h - 7:00h: call [phone] Telephone: [phone]  2:55 PM 02/15/2021

## 2021-02-15 NOTE — Patient Instructions (Addendum)
ICD-10-CM   1. 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 Cigarette smoker  F17.210     3. Coronary artery calcification seen on CAT scan  I25.10         Glad much better  Glad significant inroads into quitting smoking thought need to quit fully and you have relapsed  CT scan July 2022 still with similar inflammation   CT scan also shows single vessel coronary calfictiation  Plan  - Refer cardiology for coronary artery calcification as a precaution - Aim to quit smoking -we can redo Chantix as follows  - #SMoking  - glad you want to quit and I understand you are using expired samples -  start chantix as directed  - Days 1-3: 0.5 mg once daily  - Days 4-7: 0.5 mg twice daily  - Maintenance (? Day 8):  1 mg twice daily for 11 weeks   - if you have bad dream stop medication and call us  - take the medicaiton as instructed and after food - quit smoking  1 week after starting chantix    Follow-up - Return in 1 year or sooner if needed

## 2021-02-19 NOTE — Telephone Encounter (Signed)
[facility] and spoke with Valarie Merino about the addendum that MR wanted to have done. Valarie Merino stated that he would send this over to the radiologist for them to review.  Routing back to MR so he can track.

## 2021-03-22 NOTE — Telephone Encounter (Signed)
Called and spoke with pt letting her know the info about the addendum that was done and stated to her that we are going to repeat CT in 6 months. Pt verbalized understanding. Stated to pt that we would get her in for an appt after CT to discuss results in person and she verbalized understanding. Order has been placed for the CT and recall has been placed for pt to be seen in 6 months after the CT. Nothing further needed.

## 2021-03-22 NOTE — Telephone Encounter (Signed)
  The GGO are new since 2019 in UL  Plan CT chest without contrast in 6 months  xxxxx  ADDENDUM REPORT: 02/24/2021 10:49   ADDENDUM: In comparison to CT 12/05/2017, the bilateral patchy ground-glass opacities on current CT are new from 2019.     Electronically Signed   By: Suzy Bouchard M.D.   On: 02/24/2021 10:49    Addended by Gus Height, MD on 02/24/2021 10:51 AM   Study Result  Narrative & Impression  CLINICAL DATA:  Pneumonia.   EXAM: CT CHEST WITHOUT CONTRAST   TECHNIQUE: Multidetector CT imaging of the chest was performed following the standard protocol without IV contrast.   COMPARISON:  Radiograph same day   FINDINGS: Cardiovascular: No significant vascular findings. Normal heart size. No pericardial effusion. Scattered calcifications of the thoracic aorta.   Mediastinum/Nodes: Paratracheal lymph nodes are prominent but not pathologically enlarged 6 7 mm. Supraclavicular nodes.   Lungs/Pleura: There is bilateral patchy ground-glass opacities within the upper and lower lobes. No discrete nodularity. No pleural fluid. Airways normal.   Upper Abdomen: Limited view of the liver, kidneys, pancreas are unremarkable. Normal adrenal glands.   Musculoskeletal: No aggressive osseous lesion.   IMPRESSION: 1. Bilateral patchy ground-glass opacities without consolidation or nodularity. Nonspecific finding with differential including pulmonary edema, multifocal atypical pneumonia including viral pneumonia or pneumonitis. 2. Small paratracheal lymph nodes are presumably reactive.   Electronically Signed: By: Suzy Bouchard M.D. On: 12/16/2020 13:52

## 2021-03-26 NOTE — Progress Notes (Signed)
Cardiology Office Note:    Date:  03/27/2021   ID:  Jenna Hancock, DOB March 31, 1961, MRN [id number]  PCP:  Jenna Monarch, FNP  Cardiologist:  None  Electrophysiologist:  None   Referring MD: Brand Males, MD   Chief Complaint  Patient presents with   Coronary Artery Disease    History of Present Illness:    Jenna Hancock is a 60 y.o. female with a hx of hypertension, hyperlipidemia, OSA not on CPAP, TIA, tobacco use who is referred by Dr. Chase Caller for evaluation of  coronary calcifications on CT scan.  She denies any chest pain.  Reports occasional dyspnea.  She walks twice per week for about 30 minutes, denies any exertional symptoms.  Denies any lightheadedness or syncope.  Reports occasional lower extremity edema.  Reports rare palpitations.  Occurs about once per month lasting for few minutes.  She smokes 0.5 packs/day.  Was prescribed Chantix but not able to afford.  Family history includes father and sister had CVA.      Past Medical History:  Diagnosis Date   Anxiety    Arthritis    Depression    Essential hypertension, benign 12/08/2016   GERD (gastroesophageal reflux disease)    Heart murmur    AS A CHILD    High cholesterol 12/08/2016   Hyperlipidemia    Hypertension    PONV (postoperative nausea and vomiting)    Sleep apnea    DOES NOT USE CPAP    Stroke ([place])    HX OF MINI STROKE 2016    TIA (transient ischemic attack) 2017    Past Surgical History:  Procedure Laterality Date   APPENDECTOMY     JOINT REPLACEMENT     rt knee 11 yrs ago   KNEE ARTHROSCOPY Left    NASAL SINUS SURGERY     TOTAL KNEE ARTHROPLASTY Left 02/01/2020   Procedure: TOTAL KNEE ARTHROPLASTY;  Surgeon: Susa Day, MD;  Location: [facility];  Service: Orthopedics;  Laterality: Left;  general or spinal    Current Medications: Current Meds  Medication Sig   albuterol (PROVENTIL HFA;VENTOLIN HFA) 108 (90 Base) MCG/ACT inhaler Inhale 1-2 puffs into the lungs 4 (four) times daily  as needed (wheezing/shortness of breath.).    ALPRAZolam (XANAX) 0.5 MG tablet Take 0.5 mg by mouth 2 (two) times daily as needed for anxiety.    amLODipine (NORVASC) 5 MG tablet Take 5 mg by mouth 2 (two) times daily.   aspirin 325 MG tablet Take 325 mg by mouth daily.   atorvastatin (LIPITOR) 80 MG tablet Take 80 mg by mouth daily.   Calcium Carb-Cholecalciferol (CALTRATE 600+D3 PO) Take 1 tablet by mouth daily.   ezetimibe (ZETIA) 10 MG tablet Take 10 mg by mouth every evening.   FLUoxetine (PROZAC) 40 MG capsule Take 40 mg by mouth daily.   folic acid (FOLVITE) 338 MCG tablet Take 400 mcg by mouth daily.   HYDROcodone-acetaminophen (NORCO) 10-325 MG tablet Take 1 tablet by mouth every 8 (eight) hours.   irbesartan-hydrochlorothiazide (AVALIDE) 300-12.5 MG per tablet Take 1 tablet by mouth daily.   metoprolol tartrate (LOPRESSOR) 25 MG tablet Take 25 mg by mouth daily.   minocycline (MINOCIN) 50 MG capsule Take 50 mg by mouth daily as needed (facial breakouts.).    Multiple Vitamins-Minerals (MULTIVITAMIN WITH MINERALS) tablet Take 1 tablet by mouth daily.   Omega 3 1200 MG CAPS Take 1,200 mg by mouth daily.   pantoprazole (PROTONIX) 40 MG tablet Take 30- 60  min before your first and last meals of the day (Patient taking differently: Take 40 mg by mouth daily. Take 30- 60 min before your first and last meals of the day)     Allergies:   Bactrim and Sulfa antibiotics   Social History   Socioeconomic History   Marital status: Divorced    Spouse name: Not on file   Number of children: 2   Years of education: 12   Highest education level: Not on file  Occupational History   Occupation: disabled  Tobacco Use   Smoking status: Light Smoker    Packs/day: 1.00    Years: 34.00    Pack years: 34.00    Types: Cigarettes    Start date: 14   Smokeless tobacco: Never   Tobacco comments:    Currently smoking 0.5ppd as of 02/15/21  Vaping Use   Vaping Use: Never used  Substance and  Sexual Activity   Alcohol use: No    Alcohol/week: 0.0 standard drinks    Comment: (maybe one drink 3x a year)   Drug use: No   Sexual activity: Not on file  Other Topics Concern   Not on file  Social History Narrative   Not on file   Social Determinants of Health   Financial Resource Strain: Not on file  Food Insecurity: Not on file  Transportation Needs: Not on file  Physical Activity: Not on file  Stress: Not on file  Social Connections: Not on file     Family History: The patient's family history includes Diabetes in her father; Heart disease in her father; Lung cancer in her mother; Stroke in her father.  ROS:   Please see the history of present illness.     All other systems reviewed and are negative.  EKGs/Labs/Other Studies Reviewed:    The following studies were reviewed today:   EKG:  EKG is  ordered today.  The ekg ordered today demonstrates sinus bradycardia, rate 58, no ST abnormality  Recent Labs: 12/16/2020: BUN 18; Creatinine, Ser 0.79; Hemoglobin 12.1; Platelets 328; Potassium 4.1; Sodium 139  Recent Lipid Panel No results found for: CHOL, TRIG, HDL, CHOLHDL, VLDL, LDLCALC, LDLDIRECT  Physical Exam:    VS:  BP 120/68   Pulse (!) 58   Ht 5\' 4"  (1.626 m)   Wt 212 lb 6.4 oz (96.3 kg)   SpO2 94%   BMI 36.46 kg/m     Wt Readings from Last 3 Encounters:  03/27/21 212 lb 6.4 oz (96.3 kg)  02/15/21 212 lb 6.4 oz (96.3 kg)  12/16/20 220 lb (99.8 kg)     GEN:  Well nourished, well developed in no acute distress HEENT: Normal NECK: No JVD; No carotid bruits LYMPHATICS: No lymphadenopathy CARDIAC: RRR, 2/6 systolic murmur RESPIRATORY:  Clear to auscultation without rales, wheezing or rhonchi  ABDOMEN: Soft, non-tender, non-distended MUSCULOSKELETAL:  No edema; No deformity  SKIN: Warm and dry NEUROLOGIC:  Alert and oriented x 3 PSYCHIATRIC:  Normal affect   ASSESSMENT:    1. Coronary artery disease involving native coronary artery of native  heart without angina pectoris   2. Screening for cardiovascular condition   3. Heart murmur   4. Essential hypertension   5. Hyperlipidemia, unspecified hyperlipidemia type   6. Tobacco use   7. OSA (obstructive sleep apnea)    PLAN:    CAD: Mild coronary calcifications on recent CT chest.  She denies any exertional chest pain or dyspnea. -Continue atorvastatin, will check lipid panel  Heart murmur: Check echocardiogram  Hypertension: On amlodipine 10 mg daily, metoprolol 25 mg twice daily, irbesartan-HCTZ 300-12.5 mg daily.  Appears controlled, will check BMP  Hyperlipidemia: On atorvastatin 80 mg daily with Zetia 10 mg daily.  Check lipid panel  TIA: Continue aspirin, atorvastatin  Tobacco use: Counseled on the risk of tobacco use and cessation strongly encouraged  OSA: not using CPAP, encouraged to follow-up with sleep medicine to get back on CPAP  RTC in 1 year  Medication Adjustments/Labs and Tests Ordered: Current medicines are reviewed at length with the patient today.  Concerns regarding medicines are outlined above.  Orders Placed This Encounter  Procedures   Lipid Profile   Basic metabolic panel   EKG 15-[facility]J    No orders of the defined types were placed in this encounter.   There are no Patient Instructions on file for this visit.   Signed, Donato Heinz, MD  03/27/2021 9:32 AM    [place]

## 2021-03-27 ENCOUNTER — Other Ambulatory Visit (HOSPITAL_COMMUNITY)
Admission: RE | Admit: 2021-03-27 | Discharge: 2021-03-27 | Disposition: A | Discharge status: Home / Self Care | Payer: Medicare PPO | Source: Ambulatory Visit | Attending: Cardiology | Admitting: Cardiology

## 2021-03-27 ENCOUNTER — Encounter: Payer: Self-pay | Admitting: Cardiology

## 2021-03-27 ENCOUNTER — Ambulatory Visit: Payer: Medicare PPO | Admitting: Cardiology

## 2021-03-27 ENCOUNTER — Other Ambulatory Visit: Payer: Self-pay

## 2021-03-27 VITALS — BP 120/68 | HR 58 | Ht 64.0 in | Wt 212.4 lb

## 2021-03-27 DIAGNOSIS — I10 Essential (primary) hypertension: Secondary | ICD-10-CM

## 2021-03-27 DIAGNOSIS — Z136 Encounter for screening for cardiovascular disorders: Secondary | ICD-10-CM | POA: Diagnosis present

## 2021-03-27 DIAGNOSIS — I2510 Atherosclerotic heart disease of native coronary artery without angina pectoris: Secondary | ICD-10-CM

## 2021-03-27 DIAGNOSIS — R011 Cardiac murmur, unspecified: Secondary | ICD-10-CM

## 2021-03-27 DIAGNOSIS — Z720 Tobacco use: Secondary | ICD-10-CM

## 2021-03-27 DIAGNOSIS — E785 Hyperlipidemia, unspecified: Secondary | ICD-10-CM

## 2021-03-27 DIAGNOSIS — G4733 Obstructive sleep apnea (adult) (pediatric): Secondary | ICD-10-CM

## 2021-03-27 LAB — BASIC METABOLIC PANEL
Anion gap: 7 (ref 5–15)
BUN: 18 mg/dL (ref 6–20)
CO2: 27 mmol/L (ref 22–32)
Calcium: 9.2 mg/dL (ref 8.9–10.3)
Chloride: 104 mmol/L (ref 98–111)
Creatinine, Ser: 0.64 mg/dL (ref 0.44–1.00)
GFR, Estimated: >60 mL/min (ref >60)
Glucose, Bld: 114 mg/dL — ABNORMAL HIGH (ref 70–99)
Potassium: 4.2 mmol/L (ref 3.5–5.1)
Sodium: 138 mmol/L (ref 135–145)

## 2021-03-27 LAB — LIPID PANEL
Cholesterol: 96 mg/dL (ref 0–200)
HDL: 35 mg/dL — ABNORMAL LOW (ref >40)
LDL Cholesterol: 48 mg/dL (ref 0–99)
Total CHOL/HDL Ratio: 2.7 RATIO
Triglycerides: 65 mg/dL (ref <150)
VLDL: 13 mg/dL (ref 0–40)

## 2021-03-27 NOTE — Patient Instructions (Signed)
Medication Instructions:  Your physician recommends that you continue on your current medications as directed. Please refer to the Current Medication list given to you today.  *If you need a refill on your cardiac medications before your next appointment, please call your pharmacy*   Lab Work: Lipids, BMET  If you have labs (blood work) drawn today and your tests are completely normal, you will receive your results only by: [place] (if you have MyChart) OR A paper copy in the mail If you have any lab test that is abnormal or we need to change your treatment, we will call you to review the results.   Testing/Procedures: Your physician has requested that you have an echocardiogram. Echocardiography is a painless test that uses sound waves to create images of your heart. It provides your doctor with information about the size and shape of your heart and how well your heart's chambers and valves are working. This procedure takes approximately one hour. There are no restrictions for this procedure.    Follow-Up: At [facility], you and your health needs are our priority.  As part of our continuing mission to provide you with exceptional heart care, we have created designated Provider Care Teams.  These Care Teams include your primary Cardiologist (physician) and Advanced Practice Providers (APPs -  Physician Assistants and Nurse Practitioners) who all work together to provide you with the care you need, when you need it.  We recommend signing up for the patient portal called "MyChart".  Sign up information is provided on this After Visit Summary.  MyChart is used to connect with patients for Virtual Visits (Telemedicine).  Patients are able to view lab/test results, encounter notes, upcoming appointments, etc.  Non-urgent messages can be sent to your provider as well.   To learn more about what you can do with MyChart, go to [email or web address].    Your next appointment:   12  month(s)  The format for your next appointment:   In Person  Provider:   Dr.Schumann    Other Instructions None

## 2021-03-28 ENCOUNTER — Encounter: Payer: Self-pay | Admitting: *Deleted

## 2021-04-09 ENCOUNTER — Ambulatory Visit (HOSPITAL_COMMUNITY)
Admission: RE | Admit: 2021-04-09 | Discharge: 2021-04-09 | Disposition: A | Discharge status: Home / Self Care | Payer: Medicare PPO | Source: Ambulatory Visit | Attending: Cardiology | Admitting: Cardiology

## 2021-04-09 ENCOUNTER — Other Ambulatory Visit: Payer: Self-pay

## 2021-04-09 DIAGNOSIS — R011 Cardiac murmur, unspecified: Secondary | ICD-10-CM | POA: Diagnosis present

## 2021-04-09 LAB — ECHOCARDIOGRAM COMPLETE
AR max vel: 2.21 cm2
AV Area VTI: 2.43 cm2
AV Area mean vel: 2.56 cm2
AV Mean grad: 6.5 mmHg
AV Peak grad: 14.6 mmHg
Ao pk vel: 1.91 m/s
Area-P 1/2: 2.95 cm2
S' Lateral: 2.2 cm

## 2021-04-09 NOTE — Progress Notes (Signed)
*  PRELIMINARY RESULTS* Echocardiogram 2D Echocardiogram has been performed.  Jenna Hancock 04/09/2021, 12:36 PM

## 2021-05-30 ENCOUNTER — Telehealth: Payer: Self-pay | Admitting: Internal Medicine

## 2021-05-30 MED ORDER — CEPHALEXIN 500 MG PO CAPS: 500.0000 mg | ORAL_CAPSULE | Freq: Three times a day (TID) | ORAL | 0 refills | Status: DC

## 2021-05-30 MED ORDER — PREDNISONE 10 MG PO TABS: ORAL_TABLET | ORAL | 0 refills | Status: AC

## 2021-05-30 NOTE — Telephone Encounter (Signed)
She is here with her mother.  Mother is the patient.  She tells me she is having a COPD flareup just like the mother.  Therefore she wants prescription called in for this.  She agreed to take antibiotic and prednisone    Allergies  Allergen Reactions   Bactrim Rash   Sulfa Antibiotics Rash    Pla\n  - cephalexin 500mg  three times daily x  5 days - Please take prednisone 40 mg x1 day, then 30 mg x1 day, then 20 mg x1 day, then 10 mg x1 day, and then 5 mg x1 day and stop

## 2021-05-30 NOTE — Telephone Encounter (Signed)
Rx for both prednisone taper and cephalexin have been sent to pharmacy for pt. Nothing further needed.

## 2021-05-30 NOTE — Telephone Encounter (Signed)
Lmtcb for pt.  

## 2021-05-30 NOTE — Telephone Encounter (Signed)
Patient is returning phone call. Patient phone number is [phone].

## 2021-09-04 ENCOUNTER — Encounter (INDEPENDENT_AMBULATORY_CARE_PROVIDER_SITE_OTHER): Payer: Self-pay | Admitting: *Deleted

## 2021-10-09 ENCOUNTER — Observation Stay (HOSPITAL_COMMUNITY): Payer: Medicare HMO

## 2021-10-09 ENCOUNTER — Ambulatory Visit (INDEPENDENT_AMBULATORY_CARE_PROVIDER_SITE_OTHER): Payer: Medicare HMO

## 2021-10-09 ENCOUNTER — Ambulatory Visit (HOSPITAL_COMMUNITY): Admission: RE | Admit: 2021-10-09 | Payer: Medicare HMO | Source: Ambulatory Visit | Admitting: Internal Medicine

## 2021-10-09 ENCOUNTER — Encounter: Payer: Self-pay | Admitting: Student

## 2021-10-09 ENCOUNTER — Ambulatory Visit (INDEPENDENT_AMBULATORY_CARE_PROVIDER_SITE_OTHER): Payer: Medicare HMO | Admitting: Student

## 2021-10-09 ENCOUNTER — Inpatient Hospital Stay (HOSPITAL_COMMUNITY)
Admission: AD | Admit: 2021-10-09 | Discharge: 2021-10-12 | DRG: 193 | Disposition: A | Discharge status: Home / Self Care | Payer: Medicare HMO | Source: Ambulatory Visit | Attending: Internal Medicine | Admitting: Internal Medicine

## 2021-10-09 VITALS — BP 116/60 | HR 77 | Temp 98.2°F | Ht 64.0 in | Wt 210.4 lb

## 2021-10-09 DIAGNOSIS — I10 Essential (primary) hypertension: Secondary | ICD-10-CM | POA: Diagnosis not present

## 2021-10-09 DIAGNOSIS — J441 Chronic obstructive pulmonary disease with (acute) exacerbation: Secondary | ICD-10-CM | POA: Diagnosis not present

## 2021-10-09 DIAGNOSIS — Z8673 Personal history of transient ischemic attack (TIA), and cerebral infarction without residual deficits: Secondary | ICD-10-CM

## 2021-10-09 DIAGNOSIS — J9601 Acute respiratory failure with hypoxia: Secondary | ICD-10-CM

## 2021-10-09 DIAGNOSIS — G4733 Obstructive sleep apnea (adult) (pediatric): Secondary | ICD-10-CM | POA: Diagnosis present

## 2021-10-09 DIAGNOSIS — Z888 Allergy status to other drugs, medicaments and biological substances status: Secondary | ICD-10-CM

## 2021-10-09 DIAGNOSIS — F32A Depression, unspecified: Secondary | ICD-10-CM | POA: Diagnosis present

## 2021-10-09 DIAGNOSIS — Z96653 Presence of artificial knee joint, bilateral: Secondary | ICD-10-CM | POA: Diagnosis present

## 2021-10-09 DIAGNOSIS — Z20822 Contact with and (suspected) exposure to covid-19: Secondary | ICD-10-CM | POA: Diagnosis present

## 2021-10-09 DIAGNOSIS — J206 Acute bronchitis due to rhinovirus: Secondary | ICD-10-CM

## 2021-10-09 DIAGNOSIS — J189 Pneumonia, unspecified organism: Secondary | ICD-10-CM

## 2021-10-09 DIAGNOSIS — Z6835 Body mass index (BMI) 35.0-35.9, adult: Secondary | ICD-10-CM

## 2021-10-09 DIAGNOSIS — J440 Chronic obstructive pulmonary disease with acute lower respiratory infection: Secondary | ICD-10-CM | POA: Diagnosis present

## 2021-10-09 DIAGNOSIS — Z7982 Long term (current) use of aspirin: Secondary | ICD-10-CM

## 2021-10-09 DIAGNOSIS — Z8249 Family history of ischemic heart disease and other diseases of the circulatory system: Secondary | ICD-10-CM

## 2021-10-09 DIAGNOSIS — E7800 Pure hypercholesterolemia, unspecified: Secondary | ICD-10-CM | POA: Diagnosis present

## 2021-10-09 DIAGNOSIS — K219 Gastro-esophageal reflux disease without esophagitis: Secondary | ICD-10-CM | POA: Diagnosis present

## 2021-10-09 DIAGNOSIS — J849 Interstitial pulmonary disease, unspecified: Secondary | ICD-10-CM

## 2021-10-09 DIAGNOSIS — J4531 Mild persistent asthma with (acute) exacerbation: Secondary | ICD-10-CM | POA: Diagnosis not present

## 2021-10-09 DIAGNOSIS — E66812 Obesity, class 2: Secondary | ICD-10-CM

## 2021-10-09 DIAGNOSIS — J129 Viral pneumonia, unspecified: Principal | ICD-10-CM | POA: Diagnosis present

## 2021-10-09 DIAGNOSIS — E669 Obesity, unspecified: Secondary | ICD-10-CM | POA: Diagnosis present

## 2021-10-09 DIAGNOSIS — F419 Anxiety disorder, unspecified: Secondary | ICD-10-CM | POA: Diagnosis present

## 2021-10-09 DIAGNOSIS — R011 Cardiac murmur, unspecified: Secondary | ICD-10-CM | POA: Diagnosis present

## 2021-10-09 DIAGNOSIS — T380X5A Adverse effect of glucocorticoids and synthetic analogues, initial encounter: Secondary | ICD-10-CM | POA: Diagnosis present

## 2021-10-09 DIAGNOSIS — Z9049 Acquired absence of other specified parts of digestive tract: Secondary | ICD-10-CM

## 2021-10-09 DIAGNOSIS — Z79899 Other long term (current) drug therapy: Secondary | ICD-10-CM

## 2021-10-09 DIAGNOSIS — E876 Hypokalemia: Secondary | ICD-10-CM | POA: Diagnosis not present

## 2021-10-09 DIAGNOSIS — Z882 Allergy status to sulfonamides status: Secondary | ICD-10-CM

## 2021-10-09 DIAGNOSIS — F17210 Nicotine dependence, cigarettes, uncomplicated: Secondary | ICD-10-CM | POA: Diagnosis not present

## 2021-10-09 DIAGNOSIS — Z6836 Body mass index (BMI) 36.0-36.9, adult: Secondary | ICD-10-CM

## 2021-10-09 DIAGNOSIS — J9610 Chronic respiratory failure, unspecified whether with hypoxia or hypercapnia: Secondary | ICD-10-CM | POA: Diagnosis present

## 2021-10-09 LAB — CBC WITH DIFFERENTIAL/PLATELET
Abs Immature Granulocytes: 0.17 10*3/uL — ABNORMAL HIGH (ref 0.00–0.07)
Basophils Absolute: 0 10*3/uL (ref 0.0–0.1)
Basophils Relative: 0 %
Eosinophils Absolute: 0 10*3/uL (ref 0.0–0.5)
Eosinophils Relative: 0 %
HCT: 34.8 % — ABNORMAL LOW (ref 36.0–46.0)
Hemoglobin: 11.5 g/dL — ABNORMAL LOW (ref 12.0–15.0)
Immature Granulocytes: 1 %
Lymphocytes Relative: 11 %
Lymphs Abs: 2 10*3/uL (ref 0.7–4.0)
MCH: 29.1 pg (ref 26.0–34.0)
MCHC: 33 g/dL (ref 30.0–36.0)
MCV: 88.1 fL (ref 80.0–100.0)
Monocytes Absolute: 0.9 10*3/uL (ref 0.1–1.0)
Monocytes Relative: 5 %
Neutro Abs: 15.9 10*3/uL — ABNORMAL HIGH (ref 1.7–7.7)
Neutrophils Relative %: 83 %
Platelets: 250 10*3/uL (ref 150–400)
RBC: 3.95 MIL/uL (ref 3.87–5.11)
RDW: 12.4 % (ref 11.5–15.5)
WBC: 19 10*3/uL — ABNORMAL HIGH (ref 4.0–10.5)
nRBC: 0 % (ref 0.0–0.2)

## 2021-10-09 LAB — COMPREHENSIVE METABOLIC PANEL
ALT: 50 U/L — ABNORMAL HIGH (ref 0–44)
AST: 50 U/L — ABNORMAL HIGH (ref 15–41)
Albumin: 2.7 g/dL — ABNORMAL LOW (ref 3.5–5.0)
Alkaline Phosphatase: 103 U/L (ref 38–126)
Anion gap: 11 (ref 5–15)
BUN: 16 mg/dL (ref 8–23)
CO2: 27 mmol/L (ref 22–32)
Calcium: 9.1 mg/dL (ref 8.9–10.3)
Chloride: 102 mmol/L (ref 98–111)
Creatinine, Ser: 0.82 mg/dL (ref 0.44–1.00)
GFR, Estimated: >60 mL/min (ref >60)
Glucose, Bld: 121 mg/dL — ABNORMAL HIGH (ref 70–99)
Potassium: 3.3 mmol/L — ABNORMAL LOW (ref 3.5–5.1)
Sodium: 140 mmol/L (ref 135–145)
Total Bilirubin: 0.6 mg/dL (ref 0.3–1.2)
Total Protein: 6.8 g/dL (ref 6.5–8.1)

## 2021-10-09 LAB — HIV ANTIBODY (ROUTINE TESTING W REFLEX): HIV Screen 4th Generation wRfx: NONREACTIVE

## 2021-10-09 MED ORDER — SODIUM CHLORIDE 0.9 % IV SOLN
2.0000 g | INTRAVENOUS | Status: DC
  Administered 2021-10-09 – 2021-10-11 (×3): 2 g via INTRAVENOUS
  Filled 2021-10-09 (×3): qty 20

## 2021-10-09 MED ORDER — ALBUTEROL SULFATE (2.5 MG/3ML) 0.083% IN NEBU: 2.5000 mg | INHALATION_SOLUTION | RESPIRATORY_TRACT | Status: DC | PRN

## 2021-10-09 MED ORDER — ARFORMOTEROL TARTRATE 15 MCG/2ML IN NEBU
15.0000 ug | INHALATION_SOLUTION | Freq: Two times a day (BID) | RESPIRATORY_TRACT | Status: DC
  Administered 2021-10-10 – 2021-10-12 (×5): 15 ug via RESPIRATORY_TRACT
  Filled 2021-10-09 (×6): qty 2

## 2021-10-09 MED ORDER — GUAIFENESIN-DM 100-10 MG/5ML PO SYRP
5.0000 mL | ORAL_SOLUTION | ORAL | Status: DC | PRN
  Administered 2021-10-10 – 2021-10-11 (×3): 5 mL via ORAL
  Filled 2021-10-09 (×3): qty 5

## 2021-10-09 MED ORDER — ENOXAPARIN SODIUM 40 MG/0.4ML IJ SOSY
40.0000 mg | PREFILLED_SYRINGE | INTRAMUSCULAR | Status: DC
  Administered 2021-10-09 – 2021-10-11 (×3): 40 mg via SUBCUTANEOUS
  Filled 2021-10-09 (×3): qty 0.4

## 2021-10-09 MED ORDER — SODIUM CHLORIDE 0.9 % IV SOLN
500.0000 mg | INTRAVENOUS | Status: DC
  Administered 2021-10-10: 500 mg via INTRAVENOUS
  Filled 2021-10-09 (×2): qty 5

## 2021-10-09 MED ORDER — REVEFENACIN 175 MCG/3ML IN SOLN
175.0000 ug | Freq: Every day | RESPIRATORY_TRACT | Status: DC
  Administered 2021-10-10 – 2021-10-12 (×3): 175 ug via RESPIRATORY_TRACT
  Filled 2021-10-09 (×3): qty 3

## 2021-10-09 MED ORDER — METHYLPREDNISOLONE SODIUM SUCC 125 MG IJ SOLR
60.0000 mg | Freq: Every day | INTRAMUSCULAR | Status: DC
  Administered 2021-10-10: 60 mg via INTRAVENOUS
  Filled 2021-10-09: qty 2

## 2021-10-09 NOTE — Assessment & Plan Note (Signed)
Did urge her to quit. ?Suspect smoking contributing to her susceptibility to PNA. ?

## 2021-10-09 NOTE — Consult Note (Addendum)
? ?NAME:  Jenna Hancock, MRN:  [id number], DOB:  [birth year], LOS: 1 ?ADMISSION DATE:  10/09/2021, CONSULTATION DATE:  4/26 ?REFERRING MD:  Tamala Julian, CHIEF COMPLAINT:  ILD flare vs PNA   ? 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 Cough was productive of brown colored sputum. Was started on cefdinir and prednisone on 4/22 by urgent care. No better. Sputum production unchanged, + fever and chest tightness so presented to pulm clinic. In clinic found to be hypoxic w/ new O2 requirements of 2 lpm oxygen . Sent to hospital for admit. PCCM consulted.  ? 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 ?Significant Hospital Events: ?Including procedures, antibiotic start and stop dates in addition to other pertinent events   ?4/26 admitted as direct admit  ? ?Interim History / Subjective:  ? ?Respiratory viral panel positive for rhinovirus.  She does feel like she is breathing better. ? ?Objective   ?Blood pressure (!) 140/48, pulse 77, temperature 98.8 ?F (37.1 ?C), temperature source Oral, resp. rate 18, SpO2 92 %. ?   ?   ? ?Intake/Output Summary (Last 24 hours) at 10/10/2021 1058 ?Last data filed at 10/10/2021 0400 ?Gross per 24 hour  ?Intake 349.62 ml  ?Output --  ?Net 349.62 ml  ? ?There were no vitals filed for this visit. ? ?Examination: ?General: Obese female sitting up at side of bed some labored breathing ?HENT: NCAT, tracking appropriately ?Lungs: Rhonchi diffuse bilaterally ?Cardiovascular: Regular rate rhythm S1-S2 ?Abdomen: Obese soft nontender ?Extremities: No significant edema ?Neuro: alert oriented following commands  ?GU: deferred  ? ?Resolved Hospital Problem list   ?none ? ?Assessment & Plan:  ? ?Acute hypoxic respiratory failure  ?Multi-focal  patchy pulmonary infiltrates ?H/o RB-ILD ?Tobacco abuse  ?GERD ?OSA (not on CPAP) ?Anxiety/depression ?H/o heart murmur ?HTN ?HLD ?Prior stroke ? ?Pulm problem list  ?Acute Hypoxic Respiratory Failure 2/2 acute multifocal patchy bilateral infiltrates superimposed on Known h/o RB-ILD.  ?Ddx: CAP vs ILD flare vs progression ?Plan ?Keep steroids  ?Scheduled bronchodilators  ?Wean fio2, spo2 goal of >88%  ?Hopefully able for dc by this weekend  ? ? ?Labs   ?CBC: ?Recent Labs  ?Lab 10/09/21 ?2125  ?WBC 19.0*  ?NEUTROABS 15.9*  ?HGB 11.5*  ?HCT 34.8*  ?MCV 88.1  ?PLT 250  ? ? ?Basic Metabolic Panel: ?Recent Labs  ?Lab 10/09/21 ?2125  ?NA 140  ?K 3.3*  ?CL 102  ?CO2 27  ?GLUCOSE 121*  ?BUN 16  ?CREATININE 0.82  ?CALCIUM 9.1  ? ?GFR: ?Estimated Creatinine Clearance: 80.8 mL/min (by C-G formula based on SCr of 0.82 mg/dL). ?Recent Labs  ?Lab 10/09/21 ?2125  ?WBC 19.0*  ? ? ?Liver Function Tests: ?Recent Labs  ?Lab 10/09/21 ?2125  ?AST 50*  ?ALT 50*  ?ALKPHOS 103  ?BILITOT 0.6  ?PROT 6.8  ?ALBUMIN 2.7*  ? ?No results for input(s): LIPASE, AMYLASE in the last 168 hours. ?No results for input(s): AMMONIA in the last 168 hours. ? ?ABG ?   ?Component Value Date/Time  ? TCO2 26 05/05/2008 1703  ?  ? ?Coagulation Profile: ?No results for input(s): INR, PROTIME in the last 168 hours. ? ?Cardiac Enzymes: ?No results for input(s): CKTOTAL, CKMB, CKMBINDEX, TROPONINI in the last 168 hours. ? ?  HbA1C: ?No results found for: HGBA1C ? ?CBG: ?No results for input(s): GLUCAP in the last 168 hours. ? ? ?Past Medical History:  ?She,  has a past medical history of Anxiety, Arthritis, Depression, Essential hypertension, benign (12/08/2016), GERD (gastroesophageal reflux disease), Heart murmur, High cholesterol (12/08/2016), Hyperlipidemia, Hypertension, PONV (postoperative nausea and vomiting), Sleep apnea, Stroke ([place]), and TIA (transient ischemic attack) (2017).  ? ?Surgical History:  ? ?Past Surgical History:  ?Procedure Laterality Date  ?  APPENDECTOMY    ? JOINT REPLACEMENT    ? rt knee 11 yrs ago  ? KNEE ARTHROSCOPY Left   ? NASAL SINUS SURGERY    ? TOTAL KNEE ARTHROPLASTY Left 02/01/2020  ? Procedure: TOTAL KNEE ARTHROPLASTY;  Surgeon: Susa Day, MD;  Location: [facility];  Service: Orthopedics;  Laterality: Left;  general or spinal  ?  ? ?Social History:  ? reports that she has been smoking cigarettes. She started smoking about 40 years ago. She has a 34.00 pack-year smoking history. She has never used smokeless tobacco. She reports that she does not drink alcohol and does not use drugs.  ? ?Family History:  ?Her family history includes Diabetes in her father; Heart disease in her father; Lung cancer in her mother; Stroke in her father.  ? ?Allergies ?Allergies  ?Allergen Reactions  ? Bactrim Rash  ? Sulfa Antibiotics Rash  ?  ? ?Home Medications  ?Prior to Admission medications   ?Medication Sig Start Date End Date Taking? Authorizing Provider  ?albuterol (PROVENTIL HFA;VENTOLIN HFA) 108 (90 Base) MCG/ACT inhaler Inhale 1-2 puffs into the lungs 4 (four) times daily as needed (wheezing/shortness of breath.).     [provider]  ?ALPRAZolam Duanne Moron) 0.5 MG tablet Take 0.5 mg by mouth 2 (two) times daily as needed for anxiety.     [provider]  ?amLODipine (NORVASC) 5 MG tablet Take 5 mg by mouth 2 (two) times daily.    [provider]  ?aspirin 325 MG tablet Take 325 mg by mouth daily.    [provider]  ?atorvastatin (LIPITOR) 80 MG tablet Take 80 mg by mouth daily.    [provider]  ?Calcium Carb-Cholecalciferol (CALTRATE 600+D3 PO) Take 1 tablet by mouth daily.    [provider]  ?cefdinir (OMNICEF) 300 MG capsule Take 300 mg by mouth every 12 (twelve) hours. 10/05/21   [provider]  ?ezetimibe (ZETIA) 10 MG tablet Take 10 mg by mouth every evening. 11/24/19   [provider]  ?FLUoxetine (PROZAC) 40 MG capsule Take 40 mg by mouth daily. 12/20/19   [provider]  ?folic acid (FOLVITE) 924 MCG tablet Take 400 mcg by mouth daily.    [provider]  ?HYDROcodone-acetaminophen (NORCO) 10-325 MG tablet Take 1 tablet by mouth every 8 (eight) hours.    [provider]  ?irbesartan-hydrochlorothiazide (AVALIDE) 300-12.5 MG per tablet Take 1 tablet by mouth daily.    [provider]  ?metoprolol tartrate (LOPRESSOR) 25 MG tablet Take 25 mg by mouth daily. 11/15/19   [provider]  ?minocycline (MINOCIN) 50 MG capsule Take 50 mg by mouth daily as needed (facial breakouts.).  11/25/19   [provider]  ?Multiple Vitamins-Minerals (MULTIVITAMIN WITH MINERALS) tablet Take 1 tablet by mouth daily.    [provider]  ?Omega 3 1200 MG CAPS Take 1,200 mg by mouth daily.    [provider]  ?pantoprazole (PROTONIX) 40 MG tablet Take 30- 60 min before your first and last meals  of the day ?Patient taking differently: Take 40 mg by mouth daily. Take 30- 60 min before your first and last meals of the day 06/11/17   Tanda Rockers, MD  ?Promethazine-Codeine 6.25-10 MG/5ML SOLN Take 5 mLs by mouth every 6 (six) hours as needed. 05/26/21   [provider]  ?  ? ?Garner Nash, DO ?[facility] ?10/10/2021 3:20 PM   ? ? ? ? ?

## 2021-10-09 NOTE — Assessment & Plan Note (Signed)
Cont home BP meds when med rec complete. 

## 2021-10-09 NOTE — Assessment & Plan Note (Signed)
Due to PNA above ?1. Cont pulse ox ?2. O2 via [place] ?

## 2021-10-09 NOTE — H&P (Signed)
?History and Physical  ? ? ?Patient: Jenna Hancock QHU:[id number] DOB: 08/06/1960 ?DOA: 10/09/2021 ?DOS: the patient was seen and examined on 10/09/2021 ?PCP: Jenna Monarch, FNP  ?Patient coming from: Home ? ?Chief Complaint: No chief complaint on file. ? ?HPI: Jenna Hancock is a 61 y.o. female with medical history significant of HTN, smoking, TIA, RB-ILD. ? ?Pt seen at pulm office today with several day h/o cough, SOB, fever of 101.2 at home. ? ?Cough productive of brown sputum. ? ?Symptoms initially onset several days ago as URI / sinus symptoms, which migrated down into her chest. ? ?New 2L O2 requirement at pulm office today, satting 87% on RA. ?  ?Review of Systems: As mentioned in the history of present illness. All other systems reviewed and are negative. ?Past Medical History:  ?Diagnosis Date  ? Anxiety   ? Arthritis   ? Depression   ? Essential hypertension, benign 12/08/2016  ? GERD (gastroesophageal reflux disease)   ? Heart murmur   ? AS A CHILD   ? High cholesterol 12/08/2016  ? Hyperlipidemia   ? Hypertension   ? PONV (postoperative nausea and vomiting)   ? Sleep apnea   ? DOES NOT USE CPAP   ? Stroke [facility])   ? HX OF MINI STROKE 2016   ? TIA (transient ischemic attack) 2017  ? ?Past Surgical History:  ?Procedure Laterality Date  ? APPENDECTOMY    ? JOINT REPLACEMENT    ? rt knee 11 yrs ago  ? KNEE ARTHROSCOPY Left   ? NASAL SINUS SURGERY    ? TOTAL KNEE ARTHROPLASTY Left 02/01/2020  ? Procedure: TOTAL KNEE ARTHROPLASTY;  Surgeon: Susa Day, MD;  Location: [facility];  Service: Orthopedics;  Laterality: Left;  general or spinal  ? ?Social History:  reports that she has been smoking cigarettes. She started smoking about 40 years ago. She has a 34.00 pack-year smoking history. She has never used smokeless tobacco. She reports that she does not drink alcohol and does not use drugs. ? ?Allergies  ?Allergen Reactions  ? Bactrim Rash  ? Sulfa Antibiotics Rash  ? ? ?Family History  ?Problem Relation Age of  Onset  ? Diabetes Father   ? Stroke Father   ? Heart disease Father   ? Lung cancer Mother   ? ? ?Prior to Admission medications   ?Medication Sig Start Date End Date Taking? Authorizing Provider  ?albuterol (PROVENTIL HFA;VENTOLIN HFA) 108 (90 Base) MCG/ACT inhaler Inhale 1-2 puffs into the lungs 4 (four) times daily as needed (wheezing/shortness of breath.).     [provider]  ?ALPRAZolam Duanne Moron) 0.5 MG tablet Take 0.5 mg by mouth 2 (two) times daily as needed for anxiety.     [provider]  ?amLODipine (NORVASC) 5 MG tablet Take 5 mg by mouth 2 (two) times daily.    [provider]  ?aspirin 325 MG tablet Take 325 mg by mouth daily.    [provider]  ?atorvastatin (LIPITOR) 80 MG tablet Take 80 mg by mouth daily.    [provider]  ?Calcium Carb-Cholecalciferol (CALTRATE 600+D3 PO) Take 1 tablet by mouth daily.    [provider]  ?cefdinir (OMNICEF) 300 MG capsule Take 300 mg by mouth every 12 (twelve) hours. 10/05/21   [provider]  ?ezetimibe (ZETIA) 10 MG tablet Take 10 mg by mouth every evening. 11/24/19   [provider]  ?FLUoxetine (PROZAC) 40 MG capsule Take 40 mg by mouth daily. 12/20/19  [provider]  ?folic acid (FOLVITE) 245 MCG tablet Take 400 mcg by mouth daily.    [provider]  ?HYDROcodone-acetaminophen (NORCO) 10-325 MG tablet Take 1 tablet by mouth every 8 (eight) hours.    [provider]  ?irbesartan-hydrochlorothiazide (AVALIDE) 300-12.5 MG per tablet Take 1 tablet by mouth daily.    [provider]  ?metoprolol tartrate (LOPRESSOR) 25 MG tablet Take 25 mg by mouth daily. 11/15/19   [provider]  ?minocycline (MINOCIN) 50 MG capsule Take 50 mg by mouth daily as needed (facial breakouts.).  11/25/19   [provider]  ?Multiple Vitamins-Minerals (MULTIVITAMIN WITH MINERALS) tablet Take 1 tablet by mouth daily.    [provider]  ?Omega 3 1200 MG  CAPS Take 1,200 mg by mouth daily.    [provider]  ?pantoprazole (PROTONIX) 40 MG tablet Take 30- 60 min before your first and last meals of the day ?Patient taking differently: Take 40 mg by mouth daily. Take 30- 60 min before your first and last meals of the day 06/11/17   Tanda Rockers, MD  ?Promethazine-Codeine 6.25-10 MG/5ML SOLN Take 5 mLs by mouth every 6 (six) hours as needed. 05/26/21   [provider]  ? ? ?Physical Exam: ?Vitals:  ? 10/09/21 2041  ?BP: (!) 165/59  ?Pulse: 72  ?Resp: 19  ?Temp: 98.3 ?F (36.8 ?C)  ?TempSrc: Oral  ?SpO2: 93%  ? ?Constitutional: NAD, calm, comfortable ?Eyes: PERRL, lids and conjunctivae normal ?ENMT: Mucous membranes are moist. Posterior pharynx clear of any exudate or lesions.Normal dentition.  ?Neck: normal, supple, no masses, no thyromegaly ?Respiratory: Wet sounding cough ?Cardiovascular: Regular rate and rhythm, no murmurs / rubs / gallops. No extremity edema. 2+ pedal pulses. No carotid bruits.  ?Abdomen: no tenderness, no masses palpated. No hepatosplenomegaly. Bowel sounds positive.  ?Musculoskeletal: no clubbing / cyanosis. No joint deformity upper and lower extremities. Good ROM, no contractures. Normal muscle tone.  ?Skin: no rashes, lesions, ulcers. No induration ?Neurologic: CN 2-12 grossly intact. Sensation intact, DTR normal. Strength 5/5 in all 4.  ?Psychiatric: Normal judgment and insight. Alert and oriented x 3. Normal mood.  ? ?Data Reviewed: ? ?CXR showing multifocal PNA on background of ILD ? ?Assessment and Plan: ?* Multifocal pneumonia ?Multifocal PNA on background of ILD.  DDx includes worsening ILD. ?Treating empirically as CAP for the moment ?See also Pulm office note ?PNA pathway ?Rocephin / azithro ?Also per pulm office note: ?Getting CT chest w/o contrast ?RVP ?Check covid and flu ?Sputum culture ?Urine s.pneumo and legionella ?Looks like PCCM seeing for pulm consult. ?Solumedrol '60mg'$  daily ?Maretta Bees, brovana, PRN  albuterol ?Getting CBC/CMP ? ?Acute respiratory failure with hypoxia ([place]) ?Due to PNA above ?Cont pulse ox ?O2 via [place] ? ?Cigarette smoker ?Did urge her to quit. ?Suspect smoking contributing to her susceptibility to PNA. ? ?Essential hypertension, benign ?Cont home BP meds when med-rec complete. ? ? ? ? ? Advance Care Planning:   Code Status: Full Code ? ?Consults: Pulm ? ?Family Communication: Son at bedside ? ?Severity of Illness: ?The appropriate patient status for this patient is OBSERVATION. Observation status is judged to be reasonable and necessary in order to provide the required intensity of service to ensure the patient's safety. The patient's presenting symptoms, physical exam findings, and initial radiographic and laboratory data in the context of their medical condition is felt to place them at decreased risk for further clinical deterioration. Furthermore, it is anticipated that the patient will be medically  stable for discharge from the hospital within 2 midnights of admission.  ? ?Author: ?Etta Quill., DO ?10/09/2021 9:45 PM ? ?For on call review [email or web address].  ?

## 2021-10-09 NOTE — Assessment & Plan Note (Addendum)
Multifocal PNA on background of ILD.  DDx includes worsening ILD. ?1. Treating empirically as CAP for the moment ?2. See also Pulm office note ?3. PNA pathway ?4. Rocephin / azithro ?5. Also per pulm office note: ?1. Getting CT chest w/o contrast ?2. RVP ?3. Check covid and flu ?4. Sputum culture ?5. Urine s.pneumo and legionella ?6. Looks like PCCM seeing for pulm consult. ?7. Solumedrol '60mg'$  daily ?8. Maretta Bees, brovana, PRN albuterol ?6. Getting CBC/CMP ?

## 2021-10-09 NOTE — Progress Notes (Signed)
? ?Synopsis: Referred for acute bronchitis by Dannial Monarch, FNP ? ?Subjective:  ? ?PATIENT ID: Jenna Hancock GENDER: female DOB: 30-May-1961, MRN: [id number] ? ?Chief Complaint  ?Patient presents with  ? Acute visit   ?  Increased SOB, wheezing, cough, chest tightness for the past 6 days. She is coughing up some brown sputum. She was seen at [facility] 10/05/21 and given pred and cefdinir. She has had fever in the evenings for the past few nights.   ? ?60yF with history of RB-ILD followed by Dr. Chase Caller, GERD, OSA not on CPAP, 34 py smoking - still smoking but has cut back since she's been sick, who is coming in for acute visit for cough, chest tightness and increased sputum production over last 6 days.  ? ?She says that starting last Thursday had sinus congestion and then 'it went to her chest.' She has dyspnea at rest. She does have DOE. Coughing up brownish sputum but no blood. Started cefdinir, prednisone this past Saturday but doesn't think it has been helpful. No recent trouble swallowing food or liquid. Fever at night 101.6 or so. Some night sweats. No unintentional weight loss.  ? ?Even before this past week she just felt worn down.  ? ?Otherwise pertinent review of systems is negative. ? ?Past Medical History:  ?Diagnosis Date  ? Anxiety   ? Arthritis   ? Depression   ? Essential hypertension, benign 12/08/2016  ? GERD (gastroesophageal reflux disease)   ? Heart murmur   ? AS A CHILD   ? High cholesterol 12/08/2016  ? Hyperlipidemia   ? Hypertension   ? PONV (postoperative nausea and vomiting)   ? Sleep apnea   ? DOES NOT USE CPAP   ? Stroke [facility])   ? HX OF MINI STROKE 2016   ? TIA (transient ischemic attack) 2017  ?  ? ?Family History  ?Problem Relation Age of Onset  ? Diabetes Father   ? Stroke Father   ? Heart disease Father   ? Lung cancer Mother   ?  ? ?Past Surgical History:  ?Procedure Laterality Date  ? APPENDECTOMY    ? JOINT REPLACEMENT    ? rt knee 11 yrs ago  ? KNEE ARTHROSCOPY Left   ? NASAL SINUS  SURGERY    ? TOTAL KNEE ARTHROPLASTY Left 02/01/2020  ? Procedure: TOTAL KNEE ARTHROPLASTY;  Surgeon: Susa Day, MD;  Location: [facility];  Service: Orthopedics;  Laterality: Left;  general or spinal  ? ? ?Social History  ? ?Socioeconomic History  ? Marital status: Divorced  ?  Spouse name: Not on file  ? Number of children: 2  ? Years of education: 50  ? Highest education level: Not on file  ?Occupational History  ? Occupation: disabled  ?Tobacco Use  ? Smoking status: Light Smoker  ?  Packs/day: 1.00  ?  Years: 34.00  ?  Pack years: 34.00  ?  Types: Cigarettes  ?  Start date: 67  ? Smokeless tobacco: Never  ? Tobacco comments:  ?  Currently smoking 0.5ppd as of 02/15/21  ?Vaping Use  ? Vaping Use: Never used  ?Substance and Sexual Activity  ? Alcohol use: No  ?  Alcohol/week: 0.0 standard drinks  ?  Comment: (maybe one drink 3x a year)  ? Drug use: No  ? Sexual activity: Not on file  ?Other Topics Concern  ? Not on file  ?Social History Narrative  ? Not on file  ? ?Social Determinants of Health  ? ?  Financial Resource Strain: Not on file  ?Food Insecurity: Not on file  ?Transportation Needs: Not on file  ?Physical Activity: Not on file  ?Stress: Not on file  ?Social Connections: Not on file  ?Intimate Partner Violence: Not on file  ?  ? ?Allergies  ?Allergen Reactions  ? Bactrim Rash  ? Sulfa Antibiotics Rash  ?  ? ?Outpatient Medications Prior to Visit  ?Medication Sig Dispense Refill  ? albuterol (PROVENTIL HFA;VENTOLIN HFA) 108 (90 Base) MCG/ACT inhaler Inhale 1-2 puffs into the lungs 4 (four) times daily as needed (wheezing/shortness of breath.).     ? ALPRAZolam (XANAX) 0.5 MG tablet Take 0.5 mg by mouth 2 (two) times daily as needed for anxiety.     ? amLODipine (NORVASC) 5 MG tablet Take 5 mg by mouth 2 (two) times daily.    ? aspirin 325 MG tablet Take 325 mg by mouth daily.    ? atorvastatin (LIPITOR) 80 MG tablet Take 80 mg by mouth daily.    ? Calcium Carb-Cholecalciferol (CALTRATE 600+D3 PO) Take 1  tablet by mouth daily.    ? cefdinir (OMNICEF) 300 MG capsule Take 300 mg by mouth every 12 (twelve) hours.    ? ezetimibe (ZETIA) 10 MG tablet Take 10 mg by mouth every evening.    ? FLUoxetine (PROZAC) 40 MG capsule Take 40 mg by mouth daily.    ? folic acid (FOLVITE) 622 MCG tablet Take 400 mcg by mouth daily.    ? HYDROcodone-acetaminophen (NORCO) 10-325 MG tablet Take 1 tablet by mouth every 8 (eight) hours.    ? irbesartan-hydrochlorothiazide (AVALIDE) 300-12.5 MG per tablet Take 1 tablet by mouth daily.    ? metoprolol tartrate (LOPRESSOR) 25 MG tablet Take 25 mg by mouth daily.    ? minocycline (MINOCIN) 50 MG capsule Take 50 mg by mouth daily as needed (facial breakouts.).     ? Multiple Vitamins-Minerals (MULTIVITAMIN WITH MINERALS) tablet Take 1 tablet by mouth daily.    ? Omega 3 1200 MG CAPS Take 1,200 mg by mouth daily.    ? pantoprazole (PROTONIX) 40 MG tablet Take 30- 60 min before your first and last meals of the day (Patient taking differently: Take 40 mg by mouth daily. Take 30- 60 min before your first and last meals of the day)    ? Promethazine-Codeine 6.25-10 MG/5ML SOLN Take 5 mLs by mouth every 6 (six) hours as needed.    ? cephALEXin (KEFLEX) 500 MG capsule Take 1 capsule (500 mg total) by mouth 3 (three) times daily. 15 capsule 0  ? ?No facility-administered medications prior to visit.  ? ? ? ? ? ?Objective:  ? ?Physical Exam: ? ?General appearance: 61 y.o., female, NAD, conversant  ?Eyes: anicteric sclerae; PERRL, tracking appropriately ?HENT: NCAT; MMM ?Neck: Trachea midline; no lymphadenopathy, no JVD ?Lungs: Rhonchi, inspiratory squeaks and faint expiratory wheeze bl, with normal respiratory effort ?CV: RRR, no murmur  ?Abdomen: Soft, non-tender; non-distended, BS present  ?Extremities: No peripheral edema, warm ?Skin: Normal turgor and texture; no rash ?Psych: Appropriate affect ?Neuro: Alert and oriented to person and place, no focal deficit  ? ? ? ?Vitals:  ? 10/09/21 1355  ?BP:  116/60  ?Pulse: 77  ?Temp: 98.2 ?F (36.8 ?C)  ?TempSrc: Oral  ?SpO2: (!) 87%  ?Weight: 210 lb 6.4 oz (95.4 kg)  ?Height: '5\' 4"'$  (1.626 m)  ? ?(!) 87% on RA corrects to 91% on 2L ?BMI Readings from Last 3 Encounters:  ?10/09/21 36.12 kg/m?  ?03/27/21  36.46 kg/m?  ?02/15/21 36.46 kg/m?  ? ?Wt Readings from Last 3 Encounters:  ?10/09/21 210 lb 6.4 oz (95.4 kg)  ?03/27/21 212 lb 6.4 oz (96.3 kg)  ?02/15/21 212 lb 6.4 oz (96.3 kg)  ? ? ? ?CBC ?   ?Component Value Date/Time  ? WBC 16.8 (H) 12/16/2020 1235  ? RBC 4.01 12/16/2020 1235  ? HGB 12.1 12/16/2020 1235  ? HCT 37.7 12/16/2020 1235  ? PLT 328 12/16/2020 1235  ? MCV 94.0 12/16/2020 1235  ? MCH 30.2 12/16/2020 1235  ? MCHC 32.1 12/16/2020 1235  ? RDW 12.8 12/16/2020 1235  ? LYMPHSABS 2.3 01/22/2017 1249  ? MONOABS 1.1 (H) 01/22/2017 1249  ? EOSABS 0.3 01/22/2017 1249  ? BASOSABS 0.0 01/22/2017 1249  ? ? ?Chest Imaging: ? ?CXR today with multifocal patchy peripheral predominant alveolar opacities, more confluent/consolidative in right base ? ?CT Chest 02/24/21 with patchy multifocal ggo ? ?HRCT CHest 07/07/17 reviewed by me with widespread micronodularity ? ?Pulmonary Functions Testing Results: ? ?  Latest Ref Rng & Units 04/22/2017  ? 10:52 AM  ?PFT Results  ?FVC-Pre L 2.73    ?FVC-Predicted Pre % 81    ?FVC-Post L 2.72    ?FVC-Predicted Post % 81    ?Pre FEV1/FVC % % 78    ?Post FEV1/FCV % % 78    ?FEV1-Pre L 2.13    ?FEV1-Predicted Pre % 82    ?FEV1-Post L 2.11    ?DLCO uncorrected ml/min/mmHg 20.04    ?DLCO UNC% % 85    ?DLCO corrected ml/min/mmHg 20.04    ?DLCO COR %Predicted % 85    ?DLVA Predicted % 88    ?TLC L 5.16    ?TLC % Predicted % 103    ?RV % Predicted % 121    ? ?Reviewed by me with mild obstruction ? ?Echocardiogram:  ? ?TTE 2022: ? ? 1. Left ventricular ejection fraction, by estimation, is 70 to 75%. The  ?left ventricle has hyperdynamic function. The left ventricle has no  ?regional wall motion abnormalities. There is mild left ventricular   ?hypertrophy. Left ventricular diastolic  ?parameters are indeterminate.  ? 2. Right ventricular systolic function is normal. The right ventricular  ?size is normal. Tricuspid regurgitation signal is inadequate for assessin

## 2021-10-10 ENCOUNTER — Encounter (HOSPITAL_COMMUNITY): Payer: Self-pay | Admitting: Internal Medicine

## 2021-10-10 ENCOUNTER — Other Ambulatory Visit: Payer: Self-pay

## 2021-10-10 DIAGNOSIS — Z882 Allergy status to sulfonamides status: Secondary | ICD-10-CM | POA: Diagnosis not present

## 2021-10-10 DIAGNOSIS — G4733 Obstructive sleep apnea (adult) (pediatric): Secondary | ICD-10-CM | POA: Diagnosis present

## 2021-10-10 DIAGNOSIS — J9610 Chronic respiratory failure, unspecified whether with hypoxia or hypercapnia: Secondary | ICD-10-CM | POA: Diagnosis present

## 2021-10-10 DIAGNOSIS — Z96653 Presence of artificial knee joint, bilateral: Secondary | ICD-10-CM | POA: Diagnosis present

## 2021-10-10 DIAGNOSIS — J206 Acute bronchitis due to rhinovirus: Secondary | ICD-10-CM

## 2021-10-10 DIAGNOSIS — J9601 Acute respiratory failure with hypoxia: Secondary | ICD-10-CM | POA: Diagnosis not present

## 2021-10-10 DIAGNOSIS — J440 Chronic obstructive pulmonary disease with acute lower respiratory infection: Secondary | ICD-10-CM | POA: Diagnosis present

## 2021-10-10 DIAGNOSIS — F17210 Nicotine dependence, cigarettes, uncomplicated: Secondary | ICD-10-CM | POA: Diagnosis present

## 2021-10-10 DIAGNOSIS — E66812 Obesity, class 2: Secondary | ICD-10-CM

## 2021-10-10 DIAGNOSIS — F32A Depression, unspecified: Secondary | ICD-10-CM | POA: Diagnosis present

## 2021-10-10 DIAGNOSIS — J849 Interstitial pulmonary disease, unspecified: Secondary | ICD-10-CM

## 2021-10-10 DIAGNOSIS — Z8249 Family history of ischemic heart disease and other diseases of the circulatory system: Secondary | ICD-10-CM | POA: Diagnosis not present

## 2021-10-10 DIAGNOSIS — Z9049 Acquired absence of other specified parts of digestive tract: Secondary | ICD-10-CM | POA: Diagnosis not present

## 2021-10-10 DIAGNOSIS — J189 Pneumonia, unspecified organism: Secondary | ICD-10-CM | POA: Diagnosis not present

## 2021-10-10 DIAGNOSIS — Z79899 Other long term (current) drug therapy: Secondary | ICD-10-CM | POA: Diagnosis not present

## 2021-10-10 DIAGNOSIS — J129 Viral pneumonia, unspecified: Secondary | ICD-10-CM | POA: Diagnosis present

## 2021-10-10 DIAGNOSIS — K219 Gastro-esophageal reflux disease without esophagitis: Secondary | ICD-10-CM | POA: Diagnosis present

## 2021-10-10 DIAGNOSIS — J441 Chronic obstructive pulmonary disease with (acute) exacerbation: Secondary | ICD-10-CM

## 2021-10-10 DIAGNOSIS — E669 Obesity, unspecified: Secondary | ICD-10-CM | POA: Diagnosis present

## 2021-10-10 DIAGNOSIS — F419 Anxiety disorder, unspecified: Secondary | ICD-10-CM | POA: Diagnosis present

## 2021-10-10 DIAGNOSIS — Z20822 Contact with and (suspected) exposure to covid-19: Secondary | ICD-10-CM | POA: Diagnosis present

## 2021-10-10 DIAGNOSIS — Z7982 Long term (current) use of aspirin: Secondary | ICD-10-CM | POA: Diagnosis not present

## 2021-10-10 DIAGNOSIS — Z8673 Personal history of transient ischemic attack (TIA), and cerebral infarction without residual deficits: Secondary | ICD-10-CM | POA: Diagnosis not present

## 2021-10-10 DIAGNOSIS — Z888 Allergy status to other drugs, medicaments and biological substances status: Secondary | ICD-10-CM | POA: Diagnosis not present

## 2021-10-10 DIAGNOSIS — I10 Essential (primary) hypertension: Secondary | ICD-10-CM | POA: Diagnosis present

## 2021-10-10 DIAGNOSIS — E7800 Pure hypercholesterolemia, unspecified: Secondary | ICD-10-CM | POA: Diagnosis present

## 2021-10-10 DIAGNOSIS — T380X5A Adverse effect of glucocorticoids and synthetic analogues, initial encounter: Secondary | ICD-10-CM | POA: Diagnosis present

## 2021-10-10 DIAGNOSIS — R011 Cardiac murmur, unspecified: Secondary | ICD-10-CM | POA: Diagnosis present

## 2021-10-10 LAB — RESPIRATORY PANEL BY PCR

## 2021-10-10 LAB — STREP PNEUMONIAE URINARY ANTIGEN: Strep Pneumo Urinary Antigen: NEGATIVE

## 2021-10-10 LAB — RESP PANEL BY RT-PCR (FLU A&B, COVID) ARPGX2
Influenza A by PCR: NEGATIVE
Influenza B by PCR: NEGATIVE
SARS Coronavirus 2 by RT PCR: NEGATIVE

## 2021-10-10 MED ORDER — POTASSIUM CHLORIDE CRYS ER 20 MEQ PO TBCR
40.0000 meq | EXTENDED_RELEASE_TABLET | Freq: Once | ORAL | Status: AC
  Administered 2021-10-10: 40 meq via ORAL
  Filled 2021-10-10: qty 2

## 2021-10-10 MED ORDER — EZETIMIBE 10 MG PO TABS
10.0000 mg | ORAL_TABLET | Freq: Every evening | ORAL | Status: DC
Start: 2021-10-10 — End: 2021-10-12
  Administered 2021-10-10 – 2021-10-11 (×2): 10 mg via ORAL
  Filled 2021-10-10 (×2): qty 1

## 2021-10-10 MED ORDER — ATORVASTATIN CALCIUM 80 MG PO TABS
80.0000 mg | ORAL_TABLET | Freq: Every day | ORAL | Status: DC
Start: 2021-10-10 — End: 2021-10-12
  Administered 2021-10-10 – 2021-10-12 (×3): 80 mg via ORAL
  Filled 2021-10-10 (×3): qty 1

## 2021-10-10 MED ORDER — FLUOXETINE HCL 20 MG PO CAPS
40.0000 mg | ORAL_CAPSULE | Freq: Every day | ORAL | Status: DC
  Administered 2021-10-10 – 2021-10-12 (×3): 40 mg via ORAL
  Filled 2021-10-10 (×3): qty 2

## 2021-10-10 MED ORDER — METOPROLOL TARTRATE 25 MG PO TABS
25.0000 mg | ORAL_TABLET | Freq: Two times a day (BID) | ORAL | Status: DC
  Administered 2021-10-10 – 2021-10-12 (×5): 25 mg via ORAL
  Filled 2021-10-10 (×5): qty 1

## 2021-10-10 MED ORDER — AMLODIPINE BESYLATE 5 MG PO TABS
5.0000 mg | ORAL_TABLET | Freq: Two times a day (BID) | ORAL | Status: DC
  Administered 2021-10-10 – 2021-10-12 (×5): 5 mg via ORAL
  Filled 2021-10-10 (×5): qty 1

## 2021-10-10 MED ORDER — AZITHROMYCIN 250 MG PO TABS
500.0000 mg | ORAL_TABLET | Freq: Every day | ORAL | Status: DC
  Administered 2021-10-10 – 2021-10-11 (×2): 500 mg via ORAL
  Filled 2021-10-10 (×2): qty 2

## 2021-10-10 MED ORDER — ONDANSETRON HCL 4 MG/2ML IJ SOLN
4.0000 mg | Freq: Once | INTRAMUSCULAR | Status: AC
  Administered 2021-10-10: 4 mg via INTRAVENOUS
  Filled 2021-10-10: qty 2

## 2021-10-10 MED ORDER — ALPRAZOLAM 0.25 MG PO TABS
0.5000 mg | ORAL_TABLET | Freq: Two times a day (BID) | ORAL | Status: DC | PRN
  Administered 2021-10-11 (×2): 0.5 mg via ORAL
  Filled 2021-10-10 (×2): qty 2

## 2021-10-10 MED ORDER — ASPIRIN 325 MG PO TABS
325.0000 mg | ORAL_TABLET | Freq: Every day | ORAL | Status: DC
  Administered 2021-10-10 – 2021-10-11 (×2): 325 mg via ORAL
  Filled 2021-10-10 (×2): qty 1

## 2021-10-10 NOTE — Progress Notes (Signed)
?PROGRESS NOTE ? ?Jenna Hancock OZD:[id number] DOB: 10/13/1960 DOA: 10/09/2021 ?PCP: Dannial Monarch, FNP ? ? LOS: 1 day  ? ?Brief Narrative / Interim history: ?This is a 61 year old female with HTN, ongoing tobacco use, RB-ILD followed by Dr. Chase Caller as an outpatient, comes into the hospital directed from pulmonary office with a weeks worth of shortness of breath, chest congestion, cough, and increased wheezing.  She denies any sick contacts.she reports that she has had couple of febrile episodes.  It all started as a sinus congestion, sore throat and eventually came down to her lungs.  She has been on cefdinir and prednisone for the past 4 days but did not help.  She was found to be hypoxic in office and was directed to the hospital. ? ?Subjective / 24h Interval events: ?She is still complaining of tightness in her chest each time she has a cough or takes a deep breath.  Still very short of breath with minimal activity, unable to lie flat. ? ?Assesement and Plan: ?Principal Problem: ?  Multifocal pneumonia ?Active Problems: ?  Acute respiratory failure with hypoxia ([place]) ?  Essential hypertension, benign ?  Cigarette smoker ?  Obstructive sleep apnea ?  ILD (interstitial lung disease) ([place]) ?  COPD with acute exacerbation ([place]) ?  Acute bronchitis due to Rhinovirus ?  Obesity, Class II, BMI 35-39.9 ? ?Principal problem ?Acute hypoxic respiratory failure due to COPD exacerbation due to rhinovirus pneumonia /bronchitis, underlying ILD-CT scan on admission showed patchy bilateral groundglass airspace disease, infectious/inflammatory.  Patient remains hypoxic this morning, symptomatic with cough, wheezing and congestion.  Continue supplemental oxygen, satting around 92% on 3 L nasal cannula.  Continue nebulizers, steroids.  Pulmonary consulted, appreciate input.  Wean off oxygen as tolerated.  Continue supportive treatment, add incentive spirometry, flutter valve ? ?Active problems ?Essential hypertension-continue home  medications with amlodipine, metoprolol ? ?Hyperlipidemia-continue statin ? ?Depression-continue home fluoxetine ? ?Hypokalemia-replete potassium as indicated, monitor  ? ?Tobacco use-last smoke about a week ago, smokes close to half a pack a day.  Counseled for cessation ? ?Obesity, class II-calculated BMI 36, she would benefit from weight loss ? ?Leukocytosis-likely due to steroids at home, WBC 19 K ? ?Mild LFT elevation-unclear clinical significance, she is asymptomatic.  Monitor. ? ? ?Scheduled Meds: ? amLODipine  5 mg Oral BID  ? arformoterol  15 mcg Nebulization BID  ? aspirin  325 mg Oral QHS  ? atorvastatin  80 mg Oral Daily  ? enoxaparin (LOVENOX) injection  40 mg Subcutaneous Q24H  ? ezetimibe  10 mg Oral QPM  ? FLUoxetine  40 mg Oral Daily  ? methylPREDNISolone (SOLU-MEDROL) injection  60 mg Intravenous Daily  ? metoprolol tartrate  25 mg Oral QHS  ? potassium chloride  40 mEq Oral Once  ? revefenacin  175 mcg Nebulization Daily  ? ?Continuous Infusions: ? azithromycin 500 mg (10/10/21 0026)  ? cefTRIAXone (ROCEPHIN)  IV 2 g (10/09/21 2326)  ? ?PRN Meds:.albuterol, ALPRAZolam, guaiFENesin-dextromethorphan ? ?Diet Orders (From admission, onward)  ? ?  Start     Ordered  ? 10/09/21 2121  Diet Heart Room service appropriate? Yes; Fluid consistency: Thin  Diet effective now       ?Question Answer Comment  ?Room service appropriate? Yes   ?Fluid consistency: Thin   ?  ? 10/09/21 2121  ? ?  ?  ? ?  ? ? ?DVT prophylaxis: enoxaparin (LOVENOX) injection 40 mg Start: 10/09/21 2215 ? ? ?Lab Results  ?Component Value Date  ?  PLT 250 10/09/2021  ? ? ?  Code Status: Full Code ? ?Family Communication: no family at bedside  ? ?Status is: Observation ? ?The patient will require care spanning > 2 midnights and should be moved to inpatient because: Persistent hypoxia, shortness of breath ? ?Level of care: Med-Surg ? ?Consultants:  ?Pulmonary ? ?Procedures:  ?none ? ?Microbiology  ?RVP-rhinovirus  positive ? ?Antimicrobials: ?Ceftriaxone/azithromycin 4/26 ? ? ?Objective: ?Vitals:  ? 10/09/21 2300 10/10/21 0355 10/10/21 0843 10/10/21 0917  ?BP: (!) 146/62 (!) 147/56  (!) 140/48  ?Pulse: 78 80  77  ?Resp: 18 18    ?Temp: 99.3 ?F (37.4 ?C) 99.9 ?F (37.7 ?C)  98.8 ?F (37.1 ?C)  ?TempSrc: Oral Oral  Oral  ?SpO2: 90% 90% 92% 92%  ? ? ?Intake/Output Summary (Last 24 hours) at 10/10/2021 0941 ?Last data filed at 10/10/2021 0400 ?Gross per 24 hour  ?Intake 349.62 ml  ?Output --  ?Net 349.62 ml  ? ?Wt Readings from Last 3 Encounters:  ?10/09/21 95.4 kg  ?03/27/21 96.3 kg  ?02/15/21 96.3 kg  ? ? ?Examination: ? ?Constitutional: Sitting up in bed ?Eyes: no scleral icterus ?ENMT: Mucous membranes are moist.  ?Neck: normal, supple ?Respiratory: Diffuse rhonchi both bases, end expiratory wheezing, tachypneic at times ?Cardiovascular: Regular rate and rhythm, no murmurs / rubs / gallops.  Trace edema ?Abdomen: non distended, no tenderness. Bowel sounds positive.  ?Musculoskeletal: no clubbing / cyanosis.  ?Skin: no rashes ?Neurologic: Nonfocal ? ? ?Data Reviewed: I have independently reviewed following labs and imaging studies  ? ?CBC ?Recent Labs  ?Lab 10/09/21 ?2125  ?WBC 19.0*  ?HGB 11.5*  ?HCT 34.8*  ?PLT 250  ?MCV 88.1  ?MCH 29.1  ?MCHC 33.0  ?RDW 12.4  ?LYMPHSABS 2.0  ?MONOABS 0.9  ?EOSABS 0.0  ?BASOSABS 0.0  ? ? ?Recent Labs  ?Lab 10/09/21 ?2125  ?NA 140  ?K 3.3*  ?CL 102  ?CO2 27  ?GLUCOSE 121*  ?BUN 16  ?CREATININE 0.82  ?CALCIUM 9.1  ?AST 50*  ?ALT 50*  ?ALKPHOS 103  ?BILITOT 0.6  ?ALBUMIN 2.7*  ? ? ?------------------------------------------------------------------------------------------------------------------ ?No results for input(s): CHOL, HDL, LDLCALC, TRIG, CHOLHDL, LDLDIRECT in the last 72 hours. ? ?No results found for: HGBA1C ?------------------------------------------------------------------------------------------------------------------ ?No results for input(s): TSH, T4TOTAL, T3FREE, THYROIDAB in the  last 72 hours. ? ?Invalid input(s): FREET3 ? ?Cardiac Enzymes ?No results for input(s): CKMB, TROPONINI, MYOGLOBIN in the last 168 hours. ? ?Invalid input(s): CK ?------------------------------------------------------------------------------------------------------------------ ?   ?Component Value Date/Time  ? BNP 27.0 01/22/2017 1249  ? ? ?CBG: ?No results for input(s): GLUCAP in the last 168 hours. ? ?Recent Results (from the past 240 hour(s))  ?Resp Panel by RT-PCR (Flu A&B, Covid) Nasopharyngeal Swab     Status: None  ? Collection Time: 10/10/21  6:54 AM  ? Specimen: Nasopharyngeal Swab; Nasopharyngeal(NP) swabs in vial transport medium  ?Result Value Ref Range Status  ? SARS Coronavirus 2 by RT PCR NEGATIVE NEGATIVE Final  ?  Comment: (NOTE) ?SARS-CoV-2 target nucleic acids are NOT DETECTED. ? ?The SARS-CoV-2 RNA is generally detectable in upper respiratory ?specimens during the acute phase of infection. The lowest ?concentration of SARS-CoV-2 viral copies this assay can detect is ?138 copies/mL. A negative result does not preclude SARS-Cov-2 ?infection and should not be used as the sole basis for treatment or ?other patient management decisions. A negative result may occur with  ?improper specimen collection/handling, submission of specimen other ?than nasopharyngeal swab, presence of viral mutation(s) within the ?areas targeted by  this assay, and inadequate number of viral ?copies(<138 copies/mL). A negative result must be combined with ?clinical observations, patient history, and epidemiological ?information. The expected result is Negative. ? ?Fact Sheet for Patients:  ?EntrepreneurPulse.com.au ? ?Fact Sheet for Healthcare Providers:  ?IncredibleEmployment.be ? ?This test is no t yet approved or cleared by the [place] FDA and  ?has been authorized for detection and/or diagnosis of SARS-CoV-2 by ?FDA under an Emergency Use Authorization (EUA). This EUA will remain   ?in effect (meaning this test can be used) for the duration of the ?COVID-19 declaration under Section 564(b)(1) of the Act, 21 ?U.S.C.section 360bbb-3(b)(1), unless the authorization is terminated  ?or revoked sooner.  ? ? ?  ? Inf

## 2021-10-10 NOTE — Plan of Care (Signed)
  Problem: Clinical Measurements: Goal: Respiratory complications will improve Outcome: Progressing   Problem: Activity: Goal: Risk for activity intolerance will decrease Outcome: Progressing   Problem: Pain Managment: Goal: General experience of comfort will improve Outcome: Progressing   

## 2021-10-10 NOTE — Progress Notes (Signed)
Mobility Specialist Progress Note: ? ? 10/10/21 1409  ?Mobility  ?Activity Ambulated with assistance in hallway  ?Level of Assistance Modified independent, requires aide device or extra time  ?Assistive Device None  ?Distance Ambulated (ft) 200 ft  ?Activity Response Tolerated well  ?$Mobility charge 1 Mobility  ? ?Pt received in bed willing to participate in mobility. Complaints of feeling SOB. Required 3 short standing breaks. Left in bed with call bell in reach and all needs met.  ? ?Makilah Dowda ?Mobility Specialist ?Primary Phone [phone] ? ?

## 2021-10-11 ENCOUNTER — Telehealth: Payer: Self-pay | Admitting: Internal Medicine

## 2021-10-11 ENCOUNTER — Ambulatory Visit: Payer: Medicare PPO | Admitting: Adult Health

## 2021-10-11 DIAGNOSIS — J189 Pneumonia, unspecified organism: Secondary | ICD-10-CM | POA: Diagnosis not present

## 2021-10-11 LAB — COMPREHENSIVE METABOLIC PANEL
ALT: 74 U/L — ABNORMAL HIGH (ref 0–44)
AST: 67 U/L — ABNORMAL HIGH (ref 15–41)
Albumin: 2.6 g/dL — ABNORMAL LOW (ref 3.5–5.0)
Alkaline Phosphatase: 120 U/L (ref 38–126)
Anion gap: 9 (ref 5–15)
BUN: 12 mg/dL (ref 8–23)
CO2: 24 mmol/L (ref 22–32)
Calcium: 9.1 mg/dL (ref 8.9–10.3)
Chloride: 102 mmol/L (ref 98–111)
Creatinine, Ser: 0.58 mg/dL (ref 0.44–1.00)
GFR, Estimated: >60 mL/min (ref >60)
Glucose, Bld: 116 mg/dL — ABNORMAL HIGH (ref 70–99)
Potassium: 4.1 mmol/L (ref 3.5–5.1)
Sodium: 135 mmol/L (ref 135–145)
Total Bilirubin: 0.5 mg/dL (ref 0.3–1.2)
Total Protein: 6.7 g/dL (ref 6.5–8.1)

## 2021-10-11 LAB — CBC
HCT: 33.9 % — ABNORMAL LOW (ref 36.0–46.0)
Hemoglobin: 11.2 g/dL — ABNORMAL LOW (ref 12.0–15.0)
MCH: 29.2 pg (ref 26.0–34.0)
MCHC: 33 g/dL (ref 30.0–36.0)
MCV: 88.5 fL (ref 80.0–100.0)
Platelets: 260 10*3/uL (ref 150–400)
RBC: 3.83 MIL/uL — ABNORMAL LOW (ref 3.87–5.11)
RDW: 12.5 % (ref 11.5–15.5)
WBC: 15.4 10*3/uL — ABNORMAL HIGH (ref 4.0–10.5)
nRBC: 0 % (ref 0.0–0.2)

## 2021-10-11 LAB — LEGIONELLA PNEUMOPHILA SEROGP 1 UR AG: L. pneumophila Serogp 1 Ur Ag: NEGATIVE

## 2021-10-11 MED ORDER — PREDNISONE 20 MG PO TABS
40.0000 mg | ORAL_TABLET | Freq: Every day | ORAL | Status: DC
  Administered 2021-10-12: 40 mg via ORAL
  Filled 2021-10-11: qty 2

## 2021-10-11 MED ORDER — PREDNISONE 20 MG PO TABS: 20.0000 mg | ORAL_TABLET | Freq: Every day | ORAL | Status: DC

## 2021-10-11 NOTE — Progress Notes (Signed)
Mobility Specialist Progress Note: ? ? 10/11/21 1356  ?Mobility  ?Activity Ambulated with assistance in room  ?Level of Assistance Independent  ?Assistive Device None  ?Distance Ambulated (ft) 150 ft  ?Activity Response Tolerated well  ?$Mobility charge 1 Mobility  ? ?Pt received in bed willing to participate in mobility. No complaints of pain. Pt stated she felt like she was running a fever (oral temp 101.7). Left EOB with call bell in reach and all needs met.  ? ?Jenna Hancock ?Mobility Specialist ?Primary Phone [phone] ? ?

## 2021-10-11 NOTE — Progress Notes (Signed)
SATURATION QUALIFICATIONS: (This note is used to comply with regulatory documentation for home oxygen) ? ?Patient Saturations on Room Air at Rest = 91% ? ?Patient Saturations on Room Air while Ambulating = 87% ? ?Patient Saturations on 3 Liters of oxygen while Ambulating = 93% ? ?Jenna Hancock ?Mobility Specialist ?Primary Phone [phone] ? ?

## 2021-10-11 NOTE — TOC Initial Note (Addendum)
Transition of Care (TOC) - Initial/Assessment Note  ? ? ?Patient Details  ?Name: Jenna Hancock ?MRN: [id number] ?Date of Birth: [birth year] ? ?Transition of Care (TOC) CM/SW Contact:    ?Marilu Favre, RN ?Phone Number: ?10/11/2021, 11:18 AM ? ?Clinical Narrative:                 ?Spoke to patient at bedside. Confirmed face sheet information .  ? ?MD ordered NEB machine and home oxygen for home. Liter flow for home oxygen to be determined once ambulatory saturation note completed ( secure chatted nurse).  ?Once above completed , [employer] will bring NEB machine and portable oxygen to room prior to discharge.  ?Above explained to patient. Patient voiced understanding. ? ?Freda Munro with [facility] aware of above and will submit to insurance for authorization once order completed.  ? ?1333 Await saturation ambulation note  ? ?1405 received note asked for order to be updated  ? ?1215 Oxygen orders updated. Freda Munro with [facility] aware , and will have oxygen and NEB machine delivered to room  ? ?Expected Discharge Plan: Home/Self Care ?Barriers to Discharge: Continued Medical Work up ? ? ?Patient Goals and CMS Choice ?Patient states their goals for this hospitalization and ongoing recovery are:: to return to home ?CMS Medicare.gov Compare Post Acute Care list provided to:: Patient ?  ? ?Expected Discharge Plan and Services ?Expected Discharge Plan: Home/Self Care ?  ?Discharge Planning Services: CM Consult ?Post Acute Care Choice: Durable Medical Equipment ?Living arrangements for the past 2 months: [place] ?                ?DME Arranged: Nebulizer machine, Oxygen ?DME Agency: [facility] ?Date DME Agency Contacted: 10/11/21 ?Time DME Agency Contacted: 0938 ?Representative spoke with at DME Agency: Freda Munro ?HH Arranged: NA ?  ?  ?  ?  ? ?Prior Living Arrangements/Services ?Living arrangements for the past 2 months: [place] ?  ?Patient language and need for interpreter reviewed:: Yes ?Do you feel  safe going back to the place where you live?: Yes      ?Need for Family Participation in Patient Care: Yes (Comment) ?Care giver support system in place?: Yes (comment) ?  ?Criminal Activity/Legal Involvement Pertinent to Current Situation/Hospitalization: No - Comment as needed ? ?Activities of Daily Living ?Home Assistive Devices/Equipment: None ?ADL Screening (condition at time of admission) ?Patient's cognitive ability adequate to safely complete daily activities?: Yes ?Is the patient deaf or have difficulty hearing?: No ?Does the patient have difficulty seeing, even when wearing glasses/contacts?: No ?Does the patient have difficulty concentrating, remembering, or making decisions?: No ?Patient able to express need for assistance with ADLs?: No ?Does the patient have difficulty dressing or bathing?: No ?Independently performs ADLs?: Yes (appropriate for developmental age) ?Does the patient have difficulty walking or climbing stairs?: No ?Weakness of Legs: None ?Weakness of Arms/Hands: None ? ?Permission Sought/Granted ?  ?Permission granted to share information with : No ?   ?   ?   ?   ? ?Emotional Assessment ?Appearance:: Appears stated age ?Attitude/Demeanor/Rapport: Engaged ?Affect (typically observed): Accepting ?Orientation: : Oriented to Self, Oriented to Place, Oriented to  Time, Oriented to Situation ?Alcohol / Substance Use: Not Applicable ?Psych Involvement: No (comment) ? ?Admission diagnosis:  Multifocal pneumonia [J18.9] ?Acute hypoxemic respiratory failure ([place]) [J96.01] ?Patient Active Problem List  ? Diagnosis Date Noted  ? ILD (interstitial lung disease) ([place]) 10/10/2021  ? COPD with acute exacerbation ([place]) 10/10/2021  ? Acute bronchitis due  to Rhinovirus 10/10/2021  ? Obesity, Class II, BMI 35-39.9 10/10/2021  ? Acute hypoxemic respiratory failure ([place]) 10/10/2021  ? Multifocal pneumonia 10/09/2021  ? Acute respiratory failure with hypoxia ([place]) 10/09/2021  ? 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  Cigarette smoker 06/13/2017  ? Asthmatic bronchitis with acute exacerbation 06/11/2017  ? Essential hypertension, benign 12/08/2016  ? High cholesterol 12/08/2016  ? Obstructive sleep apnea 06/25/2014  ? ?PCP:  Dannial Monarch, FNP ?Pharmacy:   ?[place], [place] ?[place] [address] ?Phone: [phone] Fax: [phone] ? ? ? ? ?Social Determinants of Health (SDOH) Interventions ?  ? ?Readmission Risk Interventions ? ?  02/02/2020  ?  3:13 PM  ?Readmission Risk Prevention Plan  ?Post Dischage Appt Complete  ?Medication Screening Complete  ?Transportation Screening Complete  ? ? ? ?

## 2021-10-11 NOTE — Plan of Care (Signed)
  Problem: Nutrition: Goal: Adequate nutrition will be maintained Outcome: Progressing   Problem: Coping: Goal: Level of anxiety will decrease Outcome: Progressing   Problem: Pain Managment: Goal: General experience of comfort will improve Outcome: Progressing   Problem: Safety: Goal: Ability to remain free from injury will improve Outcome: Progressing   

## 2021-10-11 NOTE — Telephone Encounter (Signed)
Pls give hospital followup with APP and then 6-8 weeks after that with me ?

## 2021-10-11 NOTE — Progress Notes (Signed)
10/11/2021 ? ?I have seen and evaluated the patient for viral PNA. ? ?S:  ?No events, feeling better.  Still on 4LPM O2. ?Wondering when she can go home ? ?O: ?Blood pressure 138/73, pulse 77, temperature 99.4 ?F (37.4 ?C), temperature source Oral, resp. rate 18, SpO2 93 %.  ?No distress ?Bilateral wheezing ?Ext warm ?Aox3 ?Ambulates well ? ?CT and labs reviewed ?Rhinovirus+ ? ?A:  ?Acute hypoxemic resp failure due to Viral PNA +/- AE-ILD improved ?Smoker ?Anxiety/depression ?HTN/HLD ? ?P:  ?- She told hospitalist she would like to wait another day before going home ?- Home O2 eval ?- 1 week pred 40 and 1 week pred 20/day (ordered) ?- IS, OOB as tolerated ?- Will arrange f/u in 1-2 weeks in pulmonary clinic ?- Needs to stop smoking ?- Available PRN ? ? ?Erskine Emery MD ?[facility] Critical [facility] ?Prefer epic messenger for cross cover needs ?If after hours, please call E-link ? ?

## 2021-10-11 NOTE — Progress Notes (Signed)
?PROGRESS NOTE ? ?Jenna Hancock YYT:[id number] DOB: November 17, 1960 DOA: 10/09/2021 ?PCP: Dannial Monarch, FNP ? ? LOS: 2 days  ? ?Brief Narrative / Interim history: ?This is a 61 year old female with HTN, ongoing tobacco use, RB-ILD followed by Dr. Chase Caller as an outpatient, comes into the hospital directed from pulmonary office with a weeks worth of shortness of breath, chest congestion, cough, and increased wheezing.  She denies any sick contacts.she reports that she has had couple of febrile episodes.  It all started as a sinus congestion, sore throat and eventually came down to her lungs.  She has been on cefdinir and prednisone for the past 4 days but did not help.  She was found to be hypoxic in office and was directed to the hospital. ? ?Subjective / 24h Interval events: ?Feels like she is got more energy today but still quite short of breath when she moves around.  Chest feels tight still ? ?Assesement and Plan: ?Principal Problem: ?  Multifocal pneumonia ?Active Problems: ?  Acute respiratory failure with hypoxia ([place]) ?  Essential hypertension, benign ?  Cigarette smoker ?  Obstructive sleep apnea ?  ILD (interstitial lung disease) ([place]) ?  COPD with acute exacerbation ([place]) ?  Acute bronchitis due to Rhinovirus ?  Obesity, Class II, BMI 35-39.9 ?  Acute hypoxemic respiratory failure ([place]) ? ?Principal problem ?Acute hypoxic respiratory failure due to COPD exacerbation due to rhinovirus pneumonia /bronchitis, underlying ILD-CT scan on admission showed patchy bilateral groundglass airspace disease, infectious/inflammatory.  Remains hypoxic today and symptomatic with persistent wheezing and dyspnea upon ambulating to the bathroom and back.  Continue supplemental oxygen, satting around 92% on 3 L nasal cannula.  Continue nebulizers, steroids.  Pulmonary consulted, appreciate input.  Wean off oxygen as tolerated.  Continue supportive treatment.  Suspect she will need home oxygen.  We will also do nebulizer.   Tentatively home tomorrow ? ?Active problems ?Essential hypertension-continue home medications with amlodipine, metoprolol.  Blood pressure controlled ? ?Hyperlipidemia-continue statin ? ?Depression-continue home fluoxetine ? ?Hypokalemia-replete potassium as indicated, continue to monitor ? ?Tobacco use-last smoke about a week ago, smokes close to half a pack a day.  Counseled for cessation ? ?Obesity, class II-calculated BMI 36, she would benefit from weight loss ? ?Leukocytosis-likely due to steroids at home, WBC 19 K ? ?Mild LFT elevation-unclear clinical significance, she is asymptomatic.  Monitor. ? ? ?Scheduled Meds: ? amLODipine  5 mg Oral BID  ? arformoterol  15 mcg Nebulization BID  ? aspirin  325 mg Oral QHS  ? atorvastatin  80 mg Oral Daily  ? azithromycin  500 mg Oral QHS  ? enoxaparin (LOVENOX) injection  40 mg Subcutaneous Q24H  ? ezetimibe  10 mg Oral QPM  ? FLUoxetine  40 mg Oral Daily  ? metoprolol tartrate  25 mg Oral BID  ? [START ON 10/12/2021] predniSONE  40 mg Oral Q breakfast  ? Followed by  ? [START ON 10/19/2021] predniSONE  20 mg Oral Q breakfast  ? revefenacin  175 mcg Nebulization Daily  ? ?Continuous Infusions: ? cefTRIAXone (ROCEPHIN)  IV 2 g (10/10/21 2250)  ? ?PRN Meds:.albuterol, ALPRAZolam, guaiFENesin-dextromethorphan ? ?Diet Orders (From admission, onward)  ? ?  Start     Ordered  ? 10/10/21 1120  Diet regular Room service appropriate? Yes; Fluid consistency: Thin  Diet effective now       ?Question Answer Comment  ?Room service appropriate? Yes   ?Fluid consistency: Thin   ?  ? 10/10/21 1119  ? ?  ?  ? ?  ? ? ?  DVT prophylaxis: enoxaparin (LOVENOX) injection 40 mg Start: 10/09/21 2215 ? ? ?Lab Results  ?Component Value Date  ? PLT 260 10/11/2021  ? ? ?  Code Status: Full Code ? ?Family Communication: no family at bedside  ? ?Status is: Inpatient ? ?Level of care: Med-Surg ? ?Consultants:  ?Pulmonary ? ?Procedures:  ?none ? ?Microbiology  ?RVP-rhinovirus  positive ? ?Antimicrobials: ?Ceftriaxone/azithromycin 4/26 ? ? ?Objective: ?Vitals:  ? 10/10/21 2237 10/11/21 0544 10/11/21 0811 10/11/21 0906  ?BP: (!) 143/60 (!) 128/49 138/73   ?Pulse: 77 79 77   ?Resp: '20 17 18   '$ ?Temp: 98.8 ?F (37.1 ?C) 98.5 ?F (36.9 ?C) 99.4 ?F (37.4 ?C)   ?TempSrc: Oral Oral Oral   ?SpO2: 90% 91% 92% 93%  ? ? ?Intake/Output Summary (Last 24 hours) at 10/11/2021 1214 ?Last data filed at 10/11/2021 [phone] ?Gross per 24 hour  ?Intake 220 ml  ?Output --  ?Net 220 ml  ? ? ?Wt Readings from Last 3 Encounters:  ?10/09/21 95.4 kg  ?03/27/21 96.3 kg  ?02/15/21 96.3 kg  ? ? ?Examination: ? ?Constitutional: No distress ?Eyes: Anicteric ?ENMT: mmm ?Neck: normal, supple ?Respiratory: Persistent bibasilar rhonchi, persistent wheezing ?Cardiovascular: Regular rate and rhythm, no murmurs, trace edema ?Abdomen: Soft, NT, ND, bowel sounds positive ?Musculoskeletal: no clubbing / cyanosis.  ?Skin: No rashes seen ?Neurologic: No focal deficits ? ? ?Data Reviewed: I have independently reviewed following labs and imaging studies  ? ?CBC ?Recent Labs  ?Lab 10/09/21 ?2125 10/11/21 ?0354  ?WBC 19.0* 15.4*  ?HGB 11.5* 11.2*  ?HCT 34.8* 33.9*  ?PLT 250 260  ?MCV 88.1 88.5  ?MCH 29.1 29.2  ?MCHC 33.0 33.0  ?RDW 12.4 12.5  ?LYMPHSABS 2.0  --   ?MONOABS 0.9  --   ?EOSABS 0.0  --   ?BASOSABS 0.0  --   ? ? ? ?Recent Labs  ?Lab 10/09/21 ?2125 10/11/21 ?6568  ?NA 140 135  ?K 3.3* 4.1  ?CL 102 102  ?CO2 27 24  ?GLUCOSE 121* 116*  ?BUN 16 12  ?CREATININE 0.82 0.58  ?CALCIUM 9.1 9.1  ?AST 50* 67*  ?ALT 50* 74*  ?ALKPHOS 103 120  ?BILITOT 0.6 0.5  ?ALBUMIN 2.7* 2.6*  ? ? ? ?------------------------------------------------------------------------------------------------------------------ ?No results for input(s): CHOL, HDL, LDLCALC, TRIG, CHOLHDL, LDLDIRECT in the last 72 hours. ? ?No results found for: HGBA1C ?------------------------------------------------------------------------------------------------------------------ ?No  results for input(s): TSH, T4TOTAL, T3FREE, THYROIDAB in the last 72 hours. ? ?Invalid input(s): FREET3 ? ?Cardiac Enzymes ?No results for input(s): CKMB, TROPONINI, MYOGLOBIN in the last 168 hours. ? ?Invalid input(s): CK ?------------------------------------------------------------------------------------------------------------------ ?   ?Component Value Date/Time  ? BNP 27.0 01/22/2017 1249  ? ? ?CBG: ?No results for input(s): GLUCAP in the last 168 hours. ? ?Recent Results (from the past 240 hour(s))  ?Expectorated Sputum Assessment w Gram Stain, Rflx to Resp Cult     Status: None (Preliminary result)  ? Collection Time: 10/10/21  5:08 AM  ? Specimen: Expectorated Sputum  ?Result Value Ref Range Status  ? Specimen Description EXPECTORATED SPUTUM  Final  ? Special Requests NONE  Final  ? Sputum evaluation   Final  ?  THIS SPECIMEN IS ACCEPTABLE FOR SPUTUM CULTURE ?Performed at [facility], [place] [address] [place], [place] [address] ?  ? Report Status PENDING  Incomplete  ?Culture, Respiratory w Gram Stain     Status: None (Preliminary result)  ? Collection Time: 10/10/21  5:08 AM  ?Result Value Ref Range Status  ? Specimen Description EXPECTORATED SPUTUM  Final  ? Special Requests NONE Reflexed from [id number]  Final  ? Gram Stain   Final  ?  RARE WBC PRESENT,BOTH PMN AND MONONUCLEAR ?NO ORGANISMS SEEN ?  ? Culture   Final  ?  CULTURE REINCUBATED FOR BETTER GROWTH ?Performed at [facility], [place] [address] [place], [place] [address] ?  ? Report Status PENDING  Incomplete  ?Resp Panel by RT-PCR (Flu A&B, Covid) Nasopharyngeal Swab     Status: None  ? Collection Time: 10/10/21  6:54 AM  ? Specimen: Nasopharyngeal Swab; Nasopharyngeal(NP) swabs in vial transport medium  ?Result Value Ref Range Status  ? SARS Coronavirus 2 by RT PCR NEGATIVE NEGATIVE Final  ?  Comment: (NOTE) ?SARS-CoV-2 target nucleic acids are NOT DETECTED. ? ?The SARS-CoV-2 RNA is generally detectable in upper respiratory ?specimens  during the acute phase of infection. The lowest ?concentration of SARS-CoV-2 viral copies this assay can detect is ?138 copies/mL. A negative result does not preclude SARS-Cov-2 ?infection and should not be used as the sole basis for treatment o

## 2021-10-12 DIAGNOSIS — J206 Acute bronchitis due to rhinovirus: Secondary | ICD-10-CM | POA: Diagnosis not present

## 2021-10-12 DIAGNOSIS — J9601 Acute respiratory failure with hypoxia: Secondary | ICD-10-CM | POA: Diagnosis not present

## 2021-10-12 DIAGNOSIS — F17210 Nicotine dependence, cigarettes, uncomplicated: Secondary | ICD-10-CM | POA: Diagnosis not present

## 2021-10-12 DIAGNOSIS — J189 Pneumonia, unspecified organism: Secondary | ICD-10-CM | POA: Diagnosis not present

## 2021-10-12 LAB — CULTURE, RESPIRATORY W GRAM STAIN: Culture: NORMAL

## 2021-10-12 LAB — EXPECTORATED SPUTUM ASSESSMENT W GRAM STAIN, RFLX TO RESP C

## 2021-10-12 MED ORDER — ALBUTEROL SULFATE (2.5 MG/3ML) 0.083% IN NEBU: 2.5000 mg | INHALATION_SOLUTION | Freq: Four times a day (QID) | RESPIRATORY_TRACT | 12 refills | Status: DC | PRN

## 2021-10-12 MED ORDER — GUAIFENESIN-DM 100-10 MG/5ML PO SYRP: 5.0000 mL | ORAL_SOLUTION | ORAL | 0 refills | Status: DC | PRN

## 2021-10-12 MED ORDER — PREDNISONE 10 MG PO TABS: ORAL_TABLET | ORAL | 0 refills | Status: AC

## 2021-10-12 MED ORDER — NICOTINE 21 MG/24HR TD PT24: 21.0000 mg | MEDICATED_PATCH | TRANSDERMAL | 1 refills | Status: DC

## 2021-10-12 MED ORDER — DOXYCYCLINE HYCLATE 100 MG PO CAPS: 100.0000 mg | ORAL_CAPSULE | Freq: Two times a day (BID) | ORAL | 0 refills | Status: AC

## 2021-10-12 NOTE — Plan of Care (Signed)
Discharged to home

## 2021-10-12 NOTE — Discharge Summary (Signed)
? ?Physician Discharge Summary  ?Jenna Hancock [id number]:[id number] DOB: Jun 26, 1960 DOA: 10/09/2021 ? ?PCP: Dannial Monarch, FNP ? ?Admit date: 10/09/2021 ?Discharge date: 10/12/2021 ? ?Admitted From: home ?Disposition:  home ? ?Recommendations for Outpatient Follow-up:  ?Follow up with Dr Chase Caller as scheduled ?Follow up with PCP in 1 week ? ?Home Health: none ?Equipment/Devices: home O2 ? ?Discharge Condition: stable ?CODE STATUS: Full code ?Diet Orders (From admission, onward)  ? ?  Start     Ordered  ? 10/10/21 1120  Diet regular Room service appropriate? Yes; Fluid consistency: Thin  Diet effective now       ?Question Answer Comment  ?Room service appropriate? Yes   ?Fluid consistency: Thin   ?  ? 10/10/21 1119  ? ?  ?  ? ?  ? ? ?HPI: Per admitting MD, ?Jenna Hancock is a 61 y.o. female with medical history significant of HTN, smoking, TIA, RB-ILD. Pt seen at pulm office today with several day h/o cough, SOB, fever of 101.2 at home. Cough productive of brown sputum. Symptoms initially onset several days ago as URI / sinus symptoms, which migrated down into her chest. New 2L O2 requirement at pulm office today, satting 87% on RA. ? ?Hospital Course / Discharge diagnoses: ?Principal Problem: ?  Multifocal pneumonia ?Active Problems: ?  Acute respiratory failure with hypoxia ([place]) ?  Essential hypertension, benign ?  Cigarette smoker ?  Obstructive sleep apnea ?  ILD (interstitial lung disease) ([place]) ?  COPD with acute exacerbation ([place]) ?  Acute bronchitis due to Rhinovirus ?  Obesity, Class II, BMI 35-39.9 ?  Acute hypoxemic respiratory failure ([place]) ? ? ?Principal problem ?Acute hypoxic respiratory failure due to COPD exacerbation due to rhinovirus pneumonia / bronchitis, underlying ILD-CT scan on admission showed patchy bilateral groundglass airspace disease, infectious/inflammatory.  She was admitted to the hospital and pulmonary consulted.  She was placed on empiric antibiotics as superimposed bacterial infection  could not be excluded.  With supportive treatment, she improved, wheezing is resolved, and clinically she is feeling better.  She will be discharged home in stable condition.  She will need oxygen on discharge, and was also set up with a nebulizer machine at home.  Will be set up for follow-up as an outpatient by pulmonary.  She will be sent with 2 days of additional antibiotics as well as a prednisone taper ? ?Active problems ?Essential hypertension-continue home medications on discharge ?Hyperlipidemia-continue statin ?Depression-continue home fluoxetine ?Hypokalemia-normalized after repletion ?Tobacco use-last smoke about a week ago, smokes close to half a pack a day.  Counseled for cessation, nicotine patch on discharge ?Obesity, class II-calculated BMI 36, she would benefit from weight loss ?Leukocytosis-likely due to steroids at home, WBC improving  ?Mild LFT elevation-unclear clinical significance, she is asymptomatic.  Continue to monitor as an outpatient.  Query fatty liver disease ?  ?Sepsis ruled out ? ? ?Discharge Instructions ? ? ?Allergies as of 10/12/2021   ? ?   Reactions  ? Bactrim Rash  ? Sulfa Antibiotics Rash  ? ?  ? ?  ?Medication List  ?  ? ?STOP taking these medications   ? ?cefdinir 300 MG capsule ?Commonly known as: OMNICEF ?  ? ?  ? ?TAKE these medications   ? ?acetaminophen 500 MG tablet ?Commonly known as: TYLENOL ?Take 1,000 mg by mouth every 6 (six) hours as needed for mild pain or fever. ?  ?ADVIL COLD & SINUS LIQUI-GELS PO ?Take 1 tablet by mouth every 4 (four) hours  as needed (cold & cough). ?  ?albuterol 108 (90 Base) MCG/ACT inhaler ?Commonly known as: VENTOLIN HFA ?Inhale 1-2 puffs into the lungs 4 (four) times daily as needed (wheezing/shortness of breath.). ?What changed: Another medication with the same name was added. Make sure you understand how and when to take each. ?  ?albuterol (2.5 MG/3ML) 0.083% nebulizer solution ?Commonly known as: PROVENTIL ?Take 3 mLs (2.5 mg total)  by nebulization every 6 (six) hours as needed for wheezing or shortness of breath. ?What changed: You were already taking a medication with the same name, and this prescription was added. Make sure you understand how and when to take each. ?  ?ALPRAZolam 0.5 MG tablet ?Commonly known as: Duanne Moron ?Take 0.5 mg by mouth 2 (two) times daily as needed for anxiety. ?  ?amLODipine 5 MG tablet ?Commonly known as: NORVASC ?Take 5 mg by mouth 2 (two) times daily. ?  ?aspirin 325 MG tablet ?Take 325 mg by mouth at bedtime. ?  ?atorvastatin 80 MG tablet ?Commonly known as: LIPITOR ?Take 80 mg by mouth daily. ?  ?CALTRATE 600+D3 PO ?Take 1 tablet by mouth daily. ?  ?doxycycline 100 MG capsule ?Commonly known as: VIBRAMYCIN ?Take 1 capsule (100 mg total) by mouth 2 (two) times daily for 2 days. ?  ?ezetimibe 10 MG tablet ?Commonly known as: ZETIA ?Take 10 mg by mouth every evening. ?  ?FLUoxetine 40 MG capsule ?Commonly known as: PROZAC ?Take 40 mg by mouth daily. ?  ?folic acid 716 MCG tablet ?Commonly known as: FOLVITE ?Take 400 mcg by mouth daily. ?  ?furosemide 20 MG tablet ?Commonly known as: LASIX ?Take 20 mg by mouth daily as needed for edema. ?  ?guaiFENesin-dextromethorphan 100-10 MG/5ML syrup ?Commonly known as: ROBITUSSIN DM ?Take 5 mLs by mouth every 4 (four) hours as needed for cough. ?  ?HYDROcodone-acetaminophen 10-325 MG tablet ?Commonly known as: NORCO ?Take 1 tablet by mouth every 8 (eight) hours as needed for moderate pain. ?  ?irbesartan-hydrochlorothiazide 300-12.5 MG tablet ?Commonly known as: AVALIDE ?Take 1 tablet by mouth daily. ?  ?metoprolol tartrate 25 MG tablet ?Commonly known as: LOPRESSOR ?Take 25 mg by mouth 2 (two) times daily. ?  ?multivitamin with minerals tablet ?Take 1 tablet by mouth daily. ?  ?nicotine 21 mg/24hr patch ?Commonly known as: NICODERM CQ - dosed in mg/24 hours ?Place 1 patch (21 mg total) onto the skin daily. ?  ?Omega 3 1200 MG Caps ?Take 1,200 mg by mouth daily. ?  ?pantoprazole  40 MG tablet ?Commonly known as: PROTONIX ?Take 30- 60 min before your first and last meals of the day ?What changed:  ?how much to take ?how to take this ?when to take this ?  ?predniSONE 10 MG tablet ?Commonly known as: DELTASONE ?Take 4 tablets (40 mg total) by mouth daily for 2 days, THEN 3 tablets (30 mg total) daily for 2 days, THEN 2 tablets (20 mg total) daily for 2 days, THEN 1 tablet (10 mg total) daily for 2 days. ?Start taking on: October 12, 2021 ?What changed:  ?medication strength ?See the new instructions. ?  ?Promethazine-Codeine 6.25-10 MG/5ML Soln ?Take 5 mLs by mouth every 6 (six) hours as needed (cough). ?  ? ?  ? ?  ?  ? ? ?  ?Durable Medical Equipment  ?(From admission, onward)  ?  ? ? ?  ? ?  Start     Ordered  ? 10/11/21 1413  For home use only DME oxygen  Once       ?  Question Answer Comment  ?Length of Need 6 Months   ?Mode or (Route) Nasal cannula   ?Liters per Minute 3   ?Frequency Continuous (stationary and portable oxygen unit needed)   ?Oxygen delivery system Gas   ?  ? 10/11/21 1412  ? 10/11/21 0836  For home use only DME oxygen  Once       ?Question Answer Comment  ?Length of Need 12 Months   ?Oxygen delivery system Gas   ?  ? 10/11/21 0835  ? 10/11/21 0823  For home use only DME Nebulizer machine  Once       ?Question Answer Comment  ?Patient needs a nebulizer to treat with the following condition ILD (interstitial lung disease) ([place])   ?Length of Need Lifetime   ?  ? 10/11/21 8850  ? ?  ?  ? ?  ? ? Follow-up Information   ? ? [facility] Follow up.   ?Why: [place] ?Contact information: ?[address] ?[place] [address] ?[phone] ? ? ?  ?  ? ?  ?  ? ?  ? ? ?Consultations: ?Pulmonary ? ?Procedures/Studies: ? ? ?DG Chest 2 View ? ?Result Date: 10/09/2021 ?CLINICAL DATA:  Fever, cough, shortness of breath and hypoxia. EXAM: CHEST - 2 VIEW COMPARISON:  December 16, 2020 FINDINGS: The heart size and mediastinal contours are within normal limits. Multifocal patchy  bilateral airspace opacities with a more nodular mass like opacity in the right lung base measuring 3.9 cm. The visualized skeletal structures are unremarkable. IMPRESSION: Multifocal patchy bilateral ai

## 2021-10-12 NOTE — TOC Transition Note (Signed)
Transition of Care (TOC) - CM/SW Discharge Note ? ? ?Patient Details  ?Name: Jenna Hancock ?MRN: [id number] ?Date of Birth: April 03, 1961 ? ?Transition of Care (TOC) CM/SW Contact:  ?Bartholomew Crews, RN ?Phone Number: [phone] ?10/12/2021, 9:20 AM ? ? ?Clinical Narrative:    ? ?Noted patient with orders to transition home today. Previous RNCM arranged for DME through [employer]. Spoke with Jasmine at [place], DME has been delivered to patient's room and home set up is scheduled for Monday 10/14/21. No further TOC needs identified at this time.  ? ?Final next level of care: Home/Self Care ?Barriers to Discharge: No Barriers Identified ? ? ?Patient Goals and CMS Choice ?Patient states their goals for this hospitalization and ongoing recovery are:: to return to home ?CMS Medicare.gov Compare Post Acute Care list provided to:: Patient ?  ? ?Discharge Placement ?  ?           ?  ?  ?  ?  ? ?Discharge Plan and Services ?  ?Discharge Planning Services: CM Consult ?Post Acute Care Choice: Durable Medical Equipment          ?DME Arranged: Nebulizer machine, Oxygen ?DME Agency: [employer] ?Date DME Agency Contacted: 10/12/21 ?Time DME Agency Contacted: [phone] ?Representative spoke with at DME Agency: Delana Meyer ?HH Arranged: NA ?  ?  ?  ?  ? ?Social Determinants of Health (SDOH) Interventions ?  ? ? ?Readmission Risk Interventions ? ?  02/02/2020  ?  3:13 PM  ?Readmission Risk Prevention Plan  ?Post Dischage Appt Complete  ?Medication Screening Complete  ?Transportation Screening Complete  ? ? ? ? ? ?

## 2021-10-14 ENCOUNTER — Telehealth: Payer: Self-pay | Admitting: Student

## 2021-10-14 NOTE — Telephone Encounter (Signed)
Called and spoke with pt letting her know that at her upcoming appt, the provider would determine if they felt like she needed more abx and she verbalized understanding. Nothing further needed. ?

## 2021-10-17 NOTE — Telephone Encounter (Signed)
Patient scheduled with MR on 12/03/2021 at 10am- nothing further needed.  ?

## 2021-10-18 ENCOUNTER — Ambulatory Visit: Payer: Medicare HMO | Admitting: Nurse Practitioner

## 2021-10-18 ENCOUNTER — Encounter: Payer: Self-pay | Admitting: Nurse Practitioner

## 2021-10-18 DIAGNOSIS — R0609 Other forms of dyspnea: Secondary | ICD-10-CM | POA: Diagnosis not present

## 2021-10-18 DIAGNOSIS — F17210 Nicotine dependence, cigarettes, uncomplicated: Secondary | ICD-10-CM | POA: Diagnosis not present

## 2021-10-18 DIAGNOSIS — J189 Pneumonia, unspecified organism: Secondary | ICD-10-CM | POA: Diagnosis not present

## 2021-10-18 DIAGNOSIS — J9601 Acute respiratory failure with hypoxia: Secondary | ICD-10-CM

## 2021-10-18 DIAGNOSIS — J849 Interstitial pulmonary disease, unspecified: Secondary | ICD-10-CM | POA: Diagnosis not present

## 2021-10-18 MED ORDER — STIOLTO RESPIMAT 2.5-2.5 MCG/ACT IN AERS: 2.0000 | INHALATION_SPRAY | Freq: Every day | RESPIRATORY_TRACT | 0 refills | Status: DC

## 2021-10-18 NOTE — Assessment & Plan Note (Signed)
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 She has since quit smoking. Counseled to remain smoke free as this is the primary treatment for RB-ILD. She verbalized understanding and will use nicotine patches if she starts to have cravings. Reassess on CT chest in 4-6 weeks. ?

## 2021-10-18 NOTE — Assessment & Plan Note (Signed)
Resolved. Walking oximetry on room air with sats in the 90's. Advised she could use her supplemental O2 as needed to maintain >88-90%; we will re-evaluated necessity at follow up. Notify of increasing requirements.  ?

## 2021-10-18 NOTE — Assessment & Plan Note (Signed)
Recovering well since her hospitalization. Clinically improved and able to maintain oxygen in the 90's on room air. 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  ? ?Patient Instructions  ?Start Stiolto 2 puffs daily. This will be your new maintenance  ?Continue Albuterol inhaler 2 puffs or 3 mL neb every 6 hours as needed for shortness of breath or wheezing. Notify if symptoms persist despite rescue inhaler/neb use. ?Continue supplemental oxygen 2 lpm as needed with activity for goal >88-90%  ? ?Repeat CT chest in 4-6 weeks ? ?Follow up in one month with Dr. Chase Caller or Alanson Aly. If symptoms do not improve or worsen, please contact office for sooner follow up or seek emergency care. ? ? ?

## 2021-10-18 NOTE — Progress Notes (Signed)
? ?'@Patient'$  ID: Jenna Hancock, female    DOB: 26-Aug-1960, 61 y.o.   MRN: [id number] ? ?Chief Complaint  ?Patient presents with  ? Follow-up  ? ? ?Referring provider: ?Dannial Monarch, FNP ? ?HPI: ?61 year old female, active smoker (34 pack years) followed for RB-ILD and DOE.  She is a patient of Dr. Golden Pop and last seen in office 10/09/2021 by Dr. Verlee Monte.  Past medical history significant for GERD, OSA not on CPAP, hypertension, DJD, obesity. ? ?TEST/EVENTS:  ?04/22/2017 PFTs: FVC 81, FEV1 82, ratio 78, TLC 103, DLCOcor 85. Overall normal PFTs. Mild hyperinflation. ?07/07/2017 HRCT chest: Atherosclerosis.  No LAD.  There is mild to moderate patchy air trapping in both lungs on expiration sequence.  There is minimal patchy groundglass centrilobular micronodularity in the upper lobes.  No acute airspace disease.  No significant regions of subpleural reticulation, parenchymal banding, traction bronchiectasis, architectural distortion or frank honeycombing.  Findings consistent with mild respiratory bronchiolitis and small airways disease. ?04/09/2021 echocardiogram: EF 7075%.  Mild LVH.  Diastolic parameters were indeterminate.  Normal PASP.  No valvular dysfunction. ?10/09/2021 CT chest without contrast: There are several small and borderline size mediastinal lymph nodes.  There is a right paratracheal lymph node measuring 9 mm.  There are extensive bilateral patchy groundglass airspace opacities, likely infectious/inflammatory.  Could reflect atypical infection.  No mass seen in the right lower lobe was question on prior chest x-ray. ? ?10/09/2021: OV with Dr. Verlee Monte.  Started having some sinus congestion the week before.  Then developed chest tightness and agree sputum production over the last few days.  Previously started on cefdinir and prednisone but felt as though it was not helping.  Having fevers at night up to 101.6 and some night sweats.  No unintentional weight loss.  Feeling very fatigued.  CXR showed  multifocal patchy peripheral predominant alveolar opacities.  Was hypoxic in office with new O2 requirement.  Recommended direct admission; advised that if she deteriorates while waiting on call for admission to present to ED.  Ordered CT chest upon arrival.  Check RVP, urine Legionella, urine strep and sputum culture.  Start CAP coverage with ceftriaxone and azithromycin.  Advised PCCM consult. ? 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  Treated with IV antibiotics and IV steroids.  She was on dual therapy nebs with Yupelri and brovana. Discharged on prednisone taper, antibiotics and home O2. ? ?10/18/2021: Today - follow up ?Patient presents today for hospital follow up with her husband. She reports feeling significantly better since being discharged and feels like she is regaining her strength back. Still has some DOE but overall, improving. Cough is minimal. She denies any recurrent fevers, night sweats, hemoptysis, unintentional weight loss. She has stopped smoking and reports that she has no desire to start back but does have nicotine patches available if she starts getting cravings. She has been wearing her supplemental oxygen at 2 lpm and reports that her oxygen levels have been in the high 90's. She is not currently on any maintenance regimen. She was discharged home with albuterol nebs. She has two days left of prednisone taper and has completed her abx.  ? ?Prior to her visit the end of April, she had had progressive DOE over the past few months. Does feel like this has improved some but still doesn't feel entirely at her baseline.  ? ?Allergies  ?Allergen Reactions  ? Bactrim Rash  ? Sulfa Antibiotics Rash  ? ? ?  Immunization History  ?Administered Date(s) Administered  ? Influenza Inj Mdck Quad Pf 03/14/2019  ? Influenza Inj Mdck Quad With Preservative 04/05/2018  ? Influenza,inj,Quad PF,6+ Mos  04/16/2014, 04/16/2017  ? Influenza,inj,quad, With Preservative 04/20/2017, 04/05/2018, 03/14/2019, 04/20/2020  ? Influenza-Unspecified 04/25/2015  ? Moderna Sars-Covid-2 Vaccination 08/15/2019, 09/15/2019, 04/16/2020  ? Pneumococcal Polysaccharide-23 02/24/2019  ? Tdap 02/16/2012  ? Zoster Recombinat (Shingrix) 03/08/2018  ? Zoster, Unspecified 03/08/2018  ? ? ?Past Medical History:  ?Diagnosis Date  ? Anxiety   ? Arthritis   ? Depression   ? Essential hypertension, benign 12/08/2016  ? GERD (gastroesophageal reflux disease)   ? Heart murmur   ? AS A CHILD   ? High cholesterol 12/08/2016  ? Hyperlipidemia   ? Hypertension   ? PONV (postoperative nausea and vomiting)   ? Sleep apnea   ? DOES NOT USE CPAP   ? Stroke [facility])   ? HX OF MINI STROKE 2016   ? TIA (transient ischemic attack) 2017  ? ? ?Tobacco History: ?Social History  ? ?Tobacco Use  ?Smoking Status Former  ? Packs/day: 1.00  ? Years: 34.00  ? Pack years: 34.00  ? Types: Cigarettes  ? Start date: 39  ? Quit date: 10/04/2021  ? Years since quitting: 0.0  ?Smokeless Tobacco Never  ?Tobacco Comments  ? Former smoker   ? ?Counseling given: Not Answered ?Tobacco comments: Former smoker  ? ? ?Outpatient Medications Prior to Visit  ?Medication Sig Dispense Refill  ? acetaminophen (TYLENOL) 500 MG tablet Take 1,000 mg by mouth every 6 (six) hours as needed for mild pain or fever.    ? albuterol (PROVENTIL HFA;VENTOLIN HFA) 108 (90 Base) MCG/ACT inhaler Inhale 1-2 puffs into the lungs 4 (four) times daily as needed (wheezing/shortness of breath.).     ? albuterol (PROVENTIL) (2.5 MG/3ML) 0.083% nebulizer solution Take 3 mLs (2.5 mg total) by nebulization every 6 (six) hours as needed for wheezing or shortness of breath. 75 mL 12  ? ALPRAZolam (XANAX) 0.5 MG tablet Take 0.5 mg by mouth 2 (two) times daily as needed for anxiety.     ? amLODipine (NORVASC) 5 MG tablet Take 5 mg by mouth 2 (two) times daily.    ? aspirin 325 MG tablet Take 325 mg by mouth at bedtime.     ? atorvastatin (LIPITOR) 80 MG tablet Take 80 mg by mouth daily.    ? Calcium Carb-Cholecalciferol (CALTRATE 600+D3 PO) Take 1 tablet by mouth daily.    ? ezetimibe (ZETIA) 10 MG tablet Take 10 mg by mouth every evening.    ? FLUoxetine (PROZAC) 40 MG capsule Take 40 mg by mouth daily.    ? folic acid (FOLVITE) 448 MCG tablet Take 400 mcg by mouth daily.    ? guaiFENesin-dextromethorphan (ROBITUSSIN DM) 100-10 MG/5ML syrup Take 5 mLs by mouth every 4 (four) hours as needed for cough. 118 mL 0  ? irbesartan-hydrochlorothiazide (AVALIDE) 300-12.5 MG per tablet Take 1 tablet by mouth daily.    ? metoprolol tartrate (LOPRESSOR) 25 MG tablet Take 25 mg by mouth 2 (two) times daily.    ? Multiple Vitamins-Minerals (MULTIVITAMIN WITH MINERALS) tablet Take 1 tablet by mouth daily.    ? Omega 3 1200 MG CAPS Take 1,200 mg by mouth daily.    ? pantoprazole (PROTONIX) 40 MG tablet Take 30- 60 min before your first and last meals of the day (Patient taking differently: Take 40 mg by mouth daily. Take 30- 60 min before your first and  last meals of the day)    ? predniSONE (DELTASONE) 10 MG tablet Take 4 tablets (40 mg total) by mouth daily for 2 days, THEN 3 tablets (30 mg total) daily for 2 days, THEN 2 tablets (20 mg total) daily for 2 days, THEN 1 tablet (10 mg total) daily for 2 days. 20 tablet 0  ? Promethazine-Codeine 6.25-10 MG/5ML SOLN Take 5 mLs by mouth every 6 (six) hours as needed (cough).    ? furosemide (LASIX) 20 MG tablet Take 20 mg by mouth daily as needed for edema. (Patient not taking: Reported on 10/18/2021)    ? HYDROcodone-acetaminophen (NORCO) 10-325 MG tablet Take 1 tablet by mouth every 8 (eight) hours as needed for moderate pain. (Patient not taking: Reported on 10/18/2021)    ? nicotine (NICODERM CQ - DOSED IN MG/24 HOURS) 21 mg/24hr patch Place 1 patch (21 mg total) onto the skin daily. (Patient not taking: Reported on 10/18/2021) 30 patch 1  ? Pseudoephedrine-Ibuprofen (ADVIL COLD & SINUS LIQUI-GELS PO)  Take 1 tablet by mouth every 4 (four) hours as needed (cold & cough). (Patient not taking: Reported on 10/18/2021)    ? ?No facility-administered medications prior to visit.  ? ? ? ?Review of Systems:  ? ?[employer]

## 2021-10-18 NOTE — Patient Instructions (Addendum)
Start Stiolto 2 puffs daily. This will be your new maintenance  ?Continue Albuterol inhaler 2 puffs or 3 mL neb every 6 hours as needed for shortness of breath or wheezing. Notify if symptoms persist despite rescue inhaler/neb use. ?Continue supplemental oxygen 2 lpm as needed with activity for goal >88-90%  ? ?Repeat CT chest in 4-6 weeks ? ?Follow up in one month with Dr. Chase Caller or Alanson Aly. If symptoms do not improve or worsen, please contact office for sooner follow up or seek emergency care. ?

## 2021-10-18 NOTE — Assessment & Plan Note (Addendum)
Concern for possible COPD component to her symptoms given her extensive smoking history. Past PFTs were normal. She did well on dual therapy nebs in the hospital and gets good benefit from albuterol use. Advised trial of Stiolto to see if this benefits her. She will need repeat PFTs at some point but want to give her adequate recovery time. ?

## 2021-10-18 NOTE — Assessment & Plan Note (Signed)
Quit since hospitalization. Counseled to remain smoke free. ?

## 2021-10-30 ENCOUNTER — Other Ambulatory Visit (INDEPENDENT_AMBULATORY_CARE_PROVIDER_SITE_OTHER): Payer: Self-pay

## 2021-10-30 DIAGNOSIS — Z1211 Encounter for screening for malignant neoplasm of colon: Secondary | ICD-10-CM

## 2021-11-12 ENCOUNTER — Telehealth (INDEPENDENT_AMBULATORY_CARE_PROVIDER_SITE_OTHER): Payer: Self-pay

## 2021-11-12 ENCOUNTER — Encounter (INDEPENDENT_AMBULATORY_CARE_PROVIDER_SITE_OTHER): Payer: Self-pay

## 2021-11-12 ENCOUNTER — Encounter (INDEPENDENT_AMBULATORY_CARE_PROVIDER_SITE_OTHER): Payer: Self-pay | Admitting: *Deleted

## 2021-11-12 MED ORDER — PEG 3350-KCL-NA BICARB-NACL 420 G PO SOLR: 4000.0000 mL | ORAL | 0 refills | Status: DC

## 2021-11-12 NOTE — Telephone Encounter (Signed)
Referring MD/PCP: Pomposini  Procedure: tcs  Reason/Indication:  Screening   Has patient had this procedure before?  no  If so, when, by whom and where?    Is there a family history of colon cancer?  no  Who?  What age when diagnosed?    Is patient diabetic? If yes, Type 1 or Type 2   no      Does patient have prosthetic heart valve or mechanical valve?  no  Do you have a pacemaker/defibrillator?  no  Has patient ever had endocarditis/atrial fibrillation? no  Does patient use oxygen? no  Has patient had joint replacement within last 12 months?  no  Is patient constipated or do they take laxatives? no  Does patient have a history of alcohol/drug use? no  Have you had a stroke/heart attack last 6 mths? no  Do you take medicine for weight loss?  no  For female patients,: have you had a hysterectomy yes                      are you post menopausal yes                      do you still have your menstrual cycle no  Is patient on blood thinner such as Coumadin, Plavix and/or Aspirin? yes  Medications: asa 325 mg daily, atorvastatin 80 mg daily, metoprolol 25 daily, ezetimibe 10 mg daily, fluoxetine 40 mg daily, irbesartan/hctz 300/12/5 mg daily, fish oil daily, hydrocodone 10/325 mg bid  Allergies: nkda  Medication Adjustment per Dr Denny Peon ASA '325MG'$  5 DAYS PRIOR   Procedure date & time: 12/10/21 AT 8:15 AM

## 2021-11-12 NOTE — Telephone Encounter (Signed)
Jenna Hancock Ann Leonela Kivi, CMA  ?

## 2021-11-12 NOTE — Telephone Encounter (Signed)
Ok to schedule.  Thanks,  Garland Smouse Castaneda Mayorga, MD Gastroenterology and Hepatology [facility]  

## 2021-11-27 ENCOUNTER — Other Ambulatory Visit: Payer: Self-pay | Admitting: Nurse Practitioner

## 2021-11-27 ENCOUNTER — Ambulatory Visit (HOSPITAL_COMMUNITY)
Admission: RE | Admit: 2021-11-27 | Discharge: 2021-11-27 | Disposition: A | Discharge status: Home / Self Care | Payer: Medicare HMO | Source: Ambulatory Visit | Attending: Nurse Practitioner | Admitting: Nurse Practitioner

## 2021-11-27 DIAGNOSIS — J849 Interstitial pulmonary disease, unspecified: Secondary | ICD-10-CM

## 2021-11-27 DIAGNOSIS — J189 Pneumonia, unspecified organism: Secondary | ICD-10-CM

## 2021-11-29 NOTE — Progress Notes (Signed)
Please notify patient that her HRCT chest showed near complete resolution of previous opacities. There are some scattered areas of of ground glass, which are likely scarring from previous infection/inflammation. There is no residual evidence of fibrotic lung disease, which is great news! Follow up with Dr. Chase Caller as scheduled. Thanks.

## 2021-12-03 ENCOUNTER — Ambulatory Visit: Payer: Medicare HMO | Admitting: Internal Medicine

## 2021-12-03 ENCOUNTER — Encounter: Payer: Self-pay | Admitting: Internal Medicine

## 2021-12-03 VITALS — BP 122/76 | HR 57 | Temp 98.3°F | Ht 64.0 in | Wt 221.8 lb

## 2021-12-03 DIAGNOSIS — J189 Pneumonia, unspecified organism: Secondary | ICD-10-CM

## 2021-12-03 DIAGNOSIS — J206 Acute bronchitis due to rhinovirus: Secondary | ICD-10-CM | POA: Diagnosis not present

## 2021-12-03 DIAGNOSIS — J329 Chronic sinusitis, unspecified: Secondary | ICD-10-CM

## 2021-12-03 DIAGNOSIS — J849 Interstitial pulmonary disease, unspecified: Secondary | ICD-10-CM

## 2021-12-03 DIAGNOSIS — J441 Chronic obstructive pulmonary disease with (acute) exacerbation: Secondary | ICD-10-CM

## 2021-12-03 DIAGNOSIS — F17210 Nicotine dependence, cigarettes, uncomplicated: Secondary | ICD-10-CM

## 2021-12-03 NOTE — Progress Notes (Signed)
Subjective:     Patient ID: Jenna Hancock, female   DOB: February 28, 1961, 61 y.o.   MRN: [id number]  HPI PCP Hancock, Jenna Anderson, MD   HPI   IOV 03/19/2017  Chief Complaint  Patient presents with   Advice Only    Abnormal cxr 01/22/17 and due to that, pt requested to come to our office for appt. C/o SOB on exertion with occ. dry cough and occ. chest tightness. Pt stated that in May and June she was cleaning her place out due to having rat infestation and became real sick then and hasn't felt the same since.   Jenna Hancock [birth year] : 61 year old female whose mother Jenna Hancock that I take care of for COPD. Patient herself is a smoker greater than 30 pack per day. But at baseline she has never had any respiratory issues. [place] with her boyfriend who is here with her today Then approximately the first week of May 2018: She took it upon herself to enter and closed small room that are not being visited in 1 year. The room contained chicken feed. There was significant rat infestation. She used a leaf blower without a mass and for 3 hours guarded of the rats. During this time a lot of rat feces she inhale and was exposed to. One day after this she started having fever and wheezing and shortness of breath and feeling extremely rundown. Then on 10/26/2016 She went to the ER in May 2018 with fever and generalized body aches following tick bite 2. She did not tell the emergency room doctors about exposure to rat feces because of embarrassment.  During this time wheezing was noticed on the physical exam. She was discharged on doxycycline. It looks like she did not get prednisone. After this she reports she's been to other urgent care but did not get prednisone and steroids. Then in June 2018 she saw primary care for chronic diarrhea At this visit she reported she lost 22 pounds. C. difficile test was negative. Then in 02/01/2017 she went to the emergency department because of shortness  of breath after mowing grass on a hot humid day. In the emergency room she did not have wheezing. She was discharged after albuterol and dexamethasone taper. She tells me that till this day that she is much better but she still has wheezing and shortness of breath and feeling fatigued and is not fully better. She is extremely worried about her respiratory issues    Chest x-ray 02/01/2017: Reported a COPD without any abnormality but to me on my personal visualization is only mildly hyperinflated but otherwise clear  Blood lab work 01/22/2017: Troponin normal, BMP normal, liver function test normal and creatinine normal and white count normal  Walking desaturation test 185 feet 3 laps on room air with a full head probe: Resting heart rate 57/m. Peak heart rate 85/m. Resting pulse ox 100%. Final pulse ox 99% and she became short of breath. She did not desaturate.   Exhaled nitric oxide in the office 03/19/2017: 7 ppb  Spirometry 03/19/2017 -> fev1 1/9L/70%, Ratio 76 - office spirometry  Past medical history review shows that she's had a history of pneumonia, sinus infections and sinus nasal surgeries.      06/11/2017 acute extended ov/Jenna Hancock re:  Chief Complaint  Patient presents with   Acute Visit    Pt c/o increased SOB, wheezing, chest tightness and prod cough with minimal green sputum- onset was 6  days ago. She had fever 1 day ago.   cough off and on since May 2018 while on breo and using lots of saba / assoc chest tight better p saba  Jenna Hancock eval 2 week prior to Jenna Hancock  Did not rec abx  Acute worse cough with purulent sputum esp in am since 06/05/17 assoc with nasal congestion/ sore throat/    sob with watery nasal d/c  rx pred / omnicef 12/21/- 12/26 and no better    No obvious day to day or daytime variability or assoc    mucus plugs or hemoptysis or cp or  or overt   hb symptoms. No unusual exposure hx or h/o childhood pna/ asthma or knowledge of premature birth.  Sleeping ok flat  without nocturnal    exacerbation  of respiratory  c/o's or need for noct saba. Also denies any obvious fluctuation of symptoms with weather or environmental changes or other aggravating or alleviating factors except as outlined above    OV 06/25/2017  Chief Complaint  Patient presents with   Follow-up    Pt [facility] and was dx with bronchitis. Pt went to ENT, Jenna Hancock and is currently on an abx. Pt has complaints of a cough with yellow mucus. Denies any SOB or CP   Jenna Hancock returns for follow-up.  She has respiratory symptoms following rat  antigen exposure in May 2018.  She continues to smoke and she continues to have respiratory symptoms.  Around Christmas 2018 she started feeling sick.  June 11, 2017 she saw my colleague Jenna Hancock who advised her to stop smoking given antibiotic and prednisone.  Did a chest x-ray that I personally visualized that shows bronchitic changes but also my opinion that might be interstitial lung disease changes.  She started feeling better with and followed up with ENT doctor Jenna Hancock who has her on prolonged antibiotic therapy right now.  Overall she is better but she feels stable.  Nevertheless she feels she is a new new low baseline.  She feels fatigued all the time.  She is frustrated with  symptomatology but she continues to smoke.   PFT 04/22/17 - normal FENO 12 ppb 06/25/2017   OV 11/23/2017  Chief Complaint  Patient presents with   Follow-up    Pt states she has been doing well since last visit and denies any complaints or concerns.    Jenna Hancock , 61 y.o. , with dob 05/12/1961 and female ,Not Hispanic or Latino from [address] [place] [address] - presents to pulm clinic for cough and resp symptoms due to smoking and chicken coop expisure with rapid antigen.  At the end of last visit in January 2019 we did a high-resolution CT scan of the chest and this shows RB ILD findings.  Autoimmune and hypersensitivity  pneumonitis panel was negative.  Currently she says she has cut down on smoking and with this her respiratory symptoms have improved.  In addition she also has cut down exposure to the chicken coop.  In fact she has eliminated this.  She still has some symptoms that are described below on the CAT score even though she does not have COPD.  The symptoms are relatively new compared to the pre-exposure days but they are improving.  Albuterol does help this.  We discussed quitting smoking because she still continues to smoke.  Several years ago she quit with the help of Chantix for 1-1/2 years but then relapsed.  She says Chantix because her $200 a month while she is spending only 25 or $30 a month for smoking.  She is try to get a lower price on the Chantix.  Apparently a friend of hers is trying to help her with the discount program.  We did give her a discount card.       OV 08/17/2018  Subjective:  Patient ID: Jenna Hancock, female , DOB: 07/25/1960 , age 78 y.o. , MRN: [id number] , ADDRESS: [place] [id number]   08/17/2018 -   Chief Complaint  Patient presents with   Follow-up    Pt states she has been doing well since last visit and denies any concerns.  FU smoking FU RB-ILD  HPI SARAHBETH CASHIN 61 y.o. -returns for follow-up.  Last seen June 2019.  With the help of Chantix she quit smoking but the remission last 3 months and approximately 2 months ago she started smoking a cigarette here and there when she tapered her Chantix down to once a day.  Now for the last 2 weeks she is gone back up on the Chantix to twice daily and smoking has been in remission.  Nevertheless these efforts have yielded significant improvement in symptoms and she feels good.  She is trying to avoid the chicken coop.  She reports no new problems.  Chart review shows that she had coronary artery calcification on the previous CT scan of the chest but she denies any chest pain or shortness of breath or wheezing or  cough.    CAT COPD Symptom & Quality of Life Score ([employer] trademark) 0 is no burden. 5 is highest burden 11/23/2017   Never Cough -> Cough all the time 3  No phlegm in chest -> Chest is full of phlegm 1  No chest tightness -> Chest feels very tight 2  No dyspnea for 1 flight stairs/hill -> Very dyspneic for 1 flight of stairs 3  No limitations for ADL at home -> Very limited with ADL at home 0  Confident leaving home -> Not at all confident leaving home -0  Sleep soundly -> Do not sleep soundly because of lung condition 2  Lots of Energy -> No energy at all 2  TOTAL Score (max 40)  13    CT chest IMPRESSION: 1. Minimal patchy ground-glass centrilobular micronodularity in the upper lobes. 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 2. Mild to moderate patchy air trapping in both lungs, indicative of small airways disease. 3. One vessel coronary atherosclerosis. 4. Cholelithiasis.   Aortic Atherosclerosis (ICD10-I70.0).     Electronically Signed   By: Ilona Sorrel M.D.   On: 07/07/2017 17:08  ROS - per HPI   OV 02/15/2021  Subjective:  Patient ID: Jenna Hancock, female , DOB: 15-Feb-1961 , age 48 y.o. , MRN: [id number] , ADDRESS: [place] [id number] PCP Dannial Monarch, FNP Patient Care Team: Dannial Monarch, FNP as PCP - General (Nurse Practitioner)  This Provider for this visit: Treatment Team:  Attending Provider: Brand Males, MD    02/15/2021 -   Chief Complaint  Patient presents with   Follow-up    Pt states she has been doing okay since last visit and denies any complaints.     HPI KRISTON MCKINNIE 61 y.o. -returns for follow-up of her smoking and RB ILD upper lobe groundglass nodules.  Last CT scan was in 2019.  She  had a CT scan of the chest in 2022.  Radiology did not compare but according to the report it seems similar without any change.  She is denying any chest  pain or shortness of breath or coughing or wheezing.  She is relapsed into smoking.  She wants another Chantix prescription if it still available on the market.  I will make this.  She denies any suicidal or homicidal ideations.  Denies any nightmares prior with Chantix.  No firearms in the house.  Of note her mother had COVID.  Be treated with antiviral.  Now she saying the mother has green nasal drainage for the last day.  She is wondering about antibiotics because the mother is also coughing more from the nasal drainage.  I made a separate phone note about that for Cindee Salt.    CT Chest data 7/322  Narrative & Impression  CLINICAL DATA:  Pneumonia.   EXAM: CT CHEST WITHOUT CONTRAST   TECHNIQUE: Multidetector CT imaging of the chest was performed following the standard protocol without IV contrast.   COMPARISON:  Radiograph same day   FINDINGS: Cardiovascular: No significant vascular findings. Normal heart size. No pericardial effusion. Scattered calcifications of the thoracic aorta.   Mediastinum/Nodes: Paratracheal lymph nodes are prominent but not pathologically enlarged 6 7 mm. Supraclavicular nodes.   Lungs/Pleura: There is bilateral patchy ground-glass opacities within the upper and lower lobes. No discrete nodularity. No pleural fluid. Airways normal.   Upper Abdomen: Limited view of the liver, kidneys, pancreas are unremarkable. Normal adrenal glands.   Musculoskeletal: No aggressive osseous lesion.   IMPRESSION: 1. Bilateral patchy ground-glass opacities without consolidation or nodularity. Nonspecific finding with differential including pulmonary edema, multifocal atypical pneumonia including viral pneumonia or pneumonitis. 2. Small paratracheal lymph nodes are presumably reactive.     Electronically Signed   By: Suzy Bouchard M.D.   On: 12/16/2020 13:52      OV 12/03/2021  Subjective:  Patient ID: Jenna Hancock, female , DOB: 1960/08/24 , age  72 y.o. , MRN: [id number] , ADDRESS: [place] [id number] PCP Hancock, Jenna Anderson, MD Patient Care Team: Hancock, Jenna Anderson, MD as PCP - General (Internal Medicine) Donato Heinz, MD as PCP - Cardiology (Cardiology)  This Provider for this visit: Treatment Team:  Attending Provider: Brand Males, MD    12/03/2021 -   Chief Complaint  Patient presents with   Follow-up    Pt states she is doing much better compared to last visit.   R BLD Smoking Behaves like COPD because of previous smoking  -with recurrent exacerbation, family history, exacerbations with wheeze  -Last PFT 2018 that was normal and no emphysema described  HP I Jenna Hancock 61 y.o. -returns for follow-up.  Since her last saw her she saw my colleague Dr. Prudence Davidson and was admitted for rhinovirus related COPD exacerbation multifocal viral pneumonia.  According to husband she lets her sinus infections linger too long and not seek early treatment.  I am meeting him for the first time.  He wanted to expressed the sentiment specifically.  She does admit to this.  In addition she does get exposed to the chicken coop.  She says she is cut down on the exposures and wears a mask but still she does it once in every 3 months.  However she has quit smoking 1 week before the hospitalization and the smoking is still in remission.  Review of the medication shows she is on [employer]  but also taking omega-3 fatty oil.  She denies any active acid reflux.  I did explain to her omega-3 fatty oil can cause acid reflux.  This is a risk factor for flareups of COPD or lung disease.  We discussed about sinus hygiene measures.  She had a high-resolution CT chest that shows near resolution of her pulmonary infiltrates.  No obvious emphysema described    CT Chest data - HRCT 11/27/21  IMPRESSION: 1. Previously seen widespread, somewhat geographic airspace opacity throughout the lungs is almost completely resolved, with  only scattered areas of residual ground-glass, for example in the left apex and in the superior segment left lower lobe. Findings are consistent with resolution of infection or inflammation. 2. No residual evidence of fibrotic interstitial lung disease. 3. Mild lobular air trapping on expiratory phase imaging, suggestive of small airways disease. 4. Coronary artery disease. 5. Cholelithiasis.   Aortic Atherosclerosis (ICD10-I70.0).     Electronically Signed   By: Delanna Ahmadi M.D.   On: 11/29/2021 12:47  No results found.    PFT     Latest Ref Rng & Units 04/22/2017   10:52 AM  PFT Results  FVC-Pre L 2.73   FVC-Predicted Pre % 81   FVC-Post L 2.72   FVC-Predicted Post % 81   Pre FEV1/FVC % % 78   Post FEV1/FCV % % 78   FEV1-Pre L 2.13   FEV1-Predicted Pre % 82   FEV1-Post L 2.11   DLCO uncorrected ml/min/mmHg 20.04   DLCO UNC% % 85   DLCO corrected ml/min/mmHg 20.04   DLCO COR %Predicted % 85   DLVA Predicted % 88   TLC L 5.16   TLC % Predicted % 103   RV % Predicted % 121        has a past medical history of Anxiety, Arthritis, Depression, Essential hypertension, benign (12/08/2016), GERD (gastroesophageal reflux disease), Heart murmur, High cholesterol (12/08/2016), Hyperlipidemia, Hypertension, PONV (postoperative nausea and vomiting), Sleep apnea, Stroke ([place]), and TIA (transient ischemic attack) (2017).   reports that she quit smoking about 1 months ago. Her smoking use included cigarettes. She started smoking about 40 years ago. She has a 34.00 pack-year smoking history. She has never used smokeless tobacco.  Past Surgical History:  Procedure Laterality Date   APPENDECTOMY     JOINT REPLACEMENT     rt knee 11 yrs ago   KNEE ARTHROSCOPY Left    NASAL SINUS SURGERY     TOTAL KNEE ARTHROPLASTY Left 02/01/2020   Procedure: TOTAL KNEE ARTHROPLASTY;  Surgeon: Susa Day, MD;  Location: [facility];  Service: Orthopedics;  Laterality: Left;  general or spinal     Allergies  Allergen Reactions   Bactrim Rash   Sulfa Antibiotics Rash    Immunization History  Administered Date(s) Administered   Influenza Inj Mdck Quad Pf 03/14/2019   Influenza Inj Mdck Quad With Preservative 04/05/2018   Influenza,inj,Quad PF,6+ Mos 04/16/2014, 04/16/2017   Influenza,inj,quad, With Preservative 04/20/2017, 04/05/2018, 03/14/2019, 04/20/2020, 04/10/2021   Influenza-Unspecified 04/25/2015   Moderna Sars-Covid-2 Vaccination 08/15/2019, 09/15/2019, 04/16/2020   PNEUMOCOCCAL CONJUGATE-20 05/16/2021   Pneumococcal Polysaccharide-23 02/24/2019   Tdap 02/16/2012   Zoster Recombinat (Shingrix) 03/08/2018   Zoster, Unspecified 03/08/2018    Family History  Problem Relation Age of Onset   Diabetes Father    Stroke Father    Heart disease Father    Lung cancer Mother      Current Outpatient Medications:    albuterol (PROVENTIL HFA;VENTOLIN HFA) 108 (90  Base) MCG/ACT inhaler, Inhale 1-2 puffs into the lungs 4 (four) times daily as needed for shortness of breath or wheezing., Disp: , Rfl:    albuterol (PROVENTIL) (2.5 MG/3ML) 0.083% nebulizer solution, Take 3 mLs (2.5 mg total) by nebulization every 6 (six) hours as needed for wheezing or shortness of breath., Disp: 75 mL, Rfl: 12   ALPRAZolam (XANAX) 0.5 MG tablet, Take 0.5 mg by mouth 2 (two) times daily as needed for anxiety. , Disp: , Rfl:    amLODipine (NORVASC) 5 MG tablet, Take 5 mg by mouth daily., Disp: , Rfl:    aspirin 325 MG tablet, Take 325 mg by mouth at bedtime., Disp: , Rfl:    atorvastatin (LIPITOR) 80 MG tablet, Take 80 mg by mouth daily., Disp: , Rfl:    ezetimibe (ZETIA) 10 MG tablet, Take 10 mg by mouth every evening., Disp: , Rfl:    FLUoxetine (PROZAC) 40 MG capsule, Take 80 mg by mouth daily., Disp: , Rfl:    folic acid (FOLVITE) 540 MCG tablet, Take 800 mcg by mouth daily., Disp: , Rfl:    HYDROcodone-acetaminophen (NORCO) 10-325 MG tablet, Take 1 tablet by mouth in the morning and at  bedtime., Disp: , Rfl:    irbesartan-hydrochlorothiazide (AVALIDE) 300-12.5 MG per tablet, Take 1 tablet by mouth daily., Disp: , Rfl:    metoprolol tartrate (LOPRESSOR) 25 MG tablet, Take 25 mg by mouth 2 (two) times daily., Disp: , Rfl:    Multiple Vitamins-Minerals (MULTIVITAMIN WITH MINERALS) tablet, Take 1 tablet by mouth daily., Disp: , Rfl:    Omega 3 1200 MG CAPS, Take 1,200 mg by mouth daily., Disp: , Rfl:    pantoprazole (PROTONIX) 40 MG tablet, Take 30- 60 min before your first and last meals of the day (Patient taking differently: Take 40 mg by mouth daily.), Disp: , Rfl:    Tiotropium Bromide-Olodaterol (STIOLTO RESPIMAT) 2.5-2.5 MCG/ACT AERS, Inhale 2 puffs into the lungs daily. (Patient taking differently: Inhale 2 puffs into the lungs daily as needed (shortness of breath).), Disp: 4 g, Rfl: 0   nicotine (NICODERM CQ - DOSED IN MG/24 HOURS) 21 mg/24hr patch, Place 1 patch (21 mg total) onto the skin daily. (Patient not taking: Reported on 10/18/2021), Disp: 30 patch, Rfl: 1      Objective:   Vitals:   12/03/21 0958  BP: 122/76  Pulse: (!) 57  Temp: 98.3 F (36.8 C)  TempSrc: Oral  SpO2: 98%  Weight: 221 lb 12.8 oz (100.6 kg)  Height: '5\' 4"'$  (1.626 m)    Estimated body mass index is 38.07 kg/m as calculated from the following:   Height as of this encounter: '5\' 4"'$  (1.626 m).   Weight as of this encounter: 221 lb 12.8 oz (100.6 kg).  '@WEIGHTCHANGE'$ @  [employer]   12/03/21 0958  Weight: 221 lb 12.8 oz (100.6 kg)     Physical Exam   General: No distress. Obese looks well.Husband at side Neuro: Alert and Oriented x 3. GCS 15. Speech normal Psych: Pleasant Resp:  Barrel Chest - no.  Wheeze - NO, Crackles - NO, No overt respiratory distress CVS: Normal heart sounds. Murmurs - no Ext: Stigmata of Connective Tissue Disease - no HEENT: Normal upper airway. PEERL +. No post nasal drip        Assessment:       ICD-10-CM   1. Multifocal pneumonia  J18.9  Pulmonary function test    2. ILD (interstitial lung disease) ([place])  J84.9  3. Cigarette smoker  F17.210     4. COPD with acute exacerbation ([place])  J44.1     5. Acute bronchitis due to Rhinovirus  J20.6     6. Recurrent sinus infections  J32.9          Plan:     Patient Instructions     ICD-10-CM   1. Multifocal pneumonia  J18.9     2. ILD (interstitial lung disease) ([place])  J84.9     3. Cigarette smoker  F17.210     4. COPD with acute exacerbation ([place])  J44.1     5. Acute bronchitis due to Rhinovirus  J20.6     6. Recurrent sinus infections  J32.9      Glad you are better after recent rhinovirus infection and multifocal viral pneumonia  Pulmonary infiltrates almost cleared up secondary to improvement from recent viral infection and also quitting smoking  As long as smoking is in remission you will heal from RB ILD  Very likely what is residual is COPD  You are prone to flareups because of previous cigarette smoking, recurrent sinus infections that get delayed in treatment and also exposure to the chicken coop  Plan  - Start saline nasal spray daily and take as steroid nasal spray daily -Anytime you having infectious exacerbation call us early -Continue Stiolto as scheduled with albuterol as needed -Smoking to stay in remission -Avoid chicken coop exposure -Stop fish oil -Get full pulmonary function test in 3 months  Follow-up - Return to see Dr. Chase Caller in 3 months  - consider ARNASA study in future     SIGNATURE    Dr. Brand Males, M.D., F.C.C.P,  Pulmonary and Critical Care Medicine Staff Physician, [place] Director - Interstitial Lung Disease  Program  P[facility] [place] [facility] [place], [place], [address]  Pager: [phone], If no answer or between  15:00h - 7:00h: call [phone] Telephone: [phone]  10:31 AM 12/03/2021

## 2021-12-03 NOTE — Patient Instructions (Addendum)
ICD-10-CM   1. Multifocal pneumonia  J18.9     2. ILD (interstitial lung disease) ([place])  J84.9     3. Cigarette smoker  F17.210     4. COPD with acute exacerbation ([place])  J44.1     5. Acute bronchitis due to Rhinovirus  J20.6     6. Recurrent sinus infections  J32.9      Glad you are better after recent rhinovirus infection and multifocal viral pneumonia  Pulmonary infiltrates almost cleared up secondary to improvement from recent viral infection and also quitting smoking  As long as smoking is in remission you will heal from RB ILD  Very likely what is residual is COPD  You are prone to flareups because of previous cigarette smoking, recurrent sinus infections that get delayed in treatment and also exposure to the chicken coop  Plan  - Start saline nasal spray daily and take as steroid nasal spray daily -Anytime you having infectious exacerbation call us early -Continue Stiolto as scheduled with albuterol as needed -Smoking to stay in remission -Avoid chicken coop exposure -Stop fish oil -Get full pulmonary function test in 3 months  Follow-up - Return to see Dr. Chase Caller in 3 months  - consider ARNASA study in future

## 2021-12-04 NOTE — Patient Instructions (Signed)
Jenna Hancock  12/04/2021     '@PREFPERIOPPHARMACY'$ @   Your procedure is scheduled on 12/10/2021.   Report to [facility] at   [phone] A.M.   Call this number if you have problems the morning of surgery:  [phone]   Remember:  Follow the diet and prep instructions given to you by the office.    Take these medicines the morning of surgery with A SIP OF WATER       xanax(if needed), norvasc, prozac, norco (if needed), metoprolol, protonix.     Do not wear jewelry, make-up or nail polish.  Do not wear lotions, powders, or perfumes, or deodorant.  Do not shave 48 hours prior to surgery.  Men may shave face and neck.  Do not bring valuables to the hospital.  [facility] is not responsible for any belongings or valuables.  Contacts, dentures or bridgework may not be worn into surgery.  Leave your suitcase in the car.  After surgery it may be brought to your room.  For patients admitted to the hospital, discharge time will be determined by your treatment team.  Patients discharged the day of surgery will not be allowed to drive home and must have someone with them for 24 hours.    Special instructions:   DO NOT smoke tobacco or vape for 24 hours before your procedure.  Please read over the following fact sheets that you were given. Anesthesia Post-op Instructions and Care and Recovery After Surgery      Colonoscopy, Adult, Care After The following information offers guidance on how to care for yourself after your procedure. Your health care provider may also give you more specific instructions. If you have problems or questions, contact your health care provider. What can I expect after the procedure? After the procedure, it is common to have: A small amount of blood in your stool for 24 hours after the procedure. Some gas. Mild cramping or bloating of your abdomen. Follow these instructions at home: Eating and drinking  Drink enough fluid to keep your urine pale  yellow. Follow instructions from your health care provider about eating or drinking restrictions. Resume your normal diet as told by your health care provider. Avoid heavy or fried foods that are hard to digest. Activity Rest as told by your health care provider. Avoid sitting for a long time without moving. Get up to take short walks every 1-2 hours. This is important to improve blood flow and breathing. Ask for help if you feel weak or unsteady. Return to your normal activities as told by your health care provider. Ask your health care provider what activities are safe for you. Managing cramping and bloating  Try walking around when you have cramps or feel bloated. If directed, apply heat to your abdomen as told by your health care provider. Use the heat source that your health care provider recommends, such as a moist heat pack or a heating pad. Place a towel between your skin and the heat source. Leave the heat on for 20-30 minutes. Remove the heat if your skin turns bright red. This is especially important if you are unable to feel pain, heat, or cold. You have a greater risk of getting burned. General instructions If you were given a sedative during the procedure, it can affect you for several hours. Do not drive or operate machinery until your health care provider says that it is safe. For the first 24 hours after the  procedure: Do not sign important documents. Do not drink alcohol. Do your regular daily activities at a slower pace than normal. Eat soft foods that are easy to digest. Take over-the-counter and prescription medicines only as told by your health care provider. Keep all follow-up visits. This is important. Contact a health care provider if: You have blood in your stool 2-3 days after the procedure. Get help right away if: You have more than a small spotting of blood in your stool. You have large blood clots in your stool. You have swelling of your abdomen. You have  nausea or vomiting. You have a fever. You have increasing pain in your abdomen that is not relieved with medicine. These symptoms may be an emergency. Get help right away. Call 911. Do not wait to see if the symptoms will go away. Do not drive yourself to the hospital. Summary After the procedure, it is common to have a small amount of blood in your stool. You may also have mild cramping and bloating of your abdomen. If you were given a sedative during the procedure, it can affect you for several hours. Do not drive or operate machinery until your health care provider says that it is safe. Get help right away if you have a lot of blood in your stool, nausea or vomiting, a fever, or increased pain in your abdomen. This information is not intended to replace advice given to you by your health care provider. Make sure you discuss any questions you have with your health care provider. Document Revised: 01/23/2021 Document Reviewed: 01/23/2021 Elsevier Patient Education  [place] After This sheet gives you information about how to care for yourself after your procedure. Your health care provider may also give you more specific instructions. If you have problems or questions, contact your health care provider. What can I expect after the procedure? After the procedure, it is common to have: Tiredness. Forgetfulness about what happened after the procedure. Impaired judgment for important decisions. Nausea or vomiting. Some difficulty with balance. Follow these instructions at home: For the time period you were told by your health care provider:     Rest as needed. Do not participate in activities where you could fall or become injured. Do not drive or use machinery. Do not drink alcohol. Do not take sleeping pills or medicines that cause drowsiness. Do not make important decisions or sign legal documents. Do not take care of children on your  own. Eating and drinking Follow the diet that is recommended by your health care provider. Drink enough fluid to keep your urine pale yellow. If you vomit: Drink water, juice, or soup when you can drink without vomiting. Make sure you have little or no nausea before eating solid foods. General instructions Have a responsible adult stay with you for the time you are told. It is important to have someone help care for you until you are awake and alert. Take over-the-counter and prescription medicines only as told by your health care provider. If you have sleep apnea, surgery and certain medicines can increase your risk for breathing problems. Follow instructions from your health care provider about wearing your sleep device: Anytime you are sleeping, including during daytime naps. While taking prescription pain medicines, sleeping medicines, or medicines that make you drowsy. Avoid smoking. Keep all follow-up visits as told by your health care provider. This is important. Contact a health care provider if: You keep feeling nauseous or you keep vomiting.  You feel light-headed. You are still sleepy or having trouble with balance after 24 hours. You develop a rash. You have a fever. You have redness or swelling around the IV site. Get help right away if: You have trouble breathing. You have new-onset confusion at home. Summary For several hours after your procedure, you may feel tired. You may also be forgetful and have poor judgment. Have a responsible adult stay with you for the time you are told. It is important to have someone help care for you until you are awake and alert. Rest as told. Do not drive or operate machinery. Do not drink alcohol or take sleeping pills. Get help right away if you have trouble breathing, or if you suddenly become confused. This information is not intended to replace advice given to you by your health care provider. Make sure you discuss any questions you  have with your health care provider. Document Revised: 05/07/2021 Document Reviewed: 05/05/2019 Elsevier Patient Education  [employer].

## 2021-12-06 ENCOUNTER — Encounter (HOSPITAL_COMMUNITY)
Admission: RE | Admit: 2021-12-06 | Discharge: 2021-12-06 | Disposition: A | Discharge status: Home / Self Care | Payer: Medicare HMO | Source: Ambulatory Visit | Attending: Gastroenterology | Admitting: Gastroenterology

## 2021-12-06 DIAGNOSIS — Z1211 Encounter for screening for malignant neoplasm of colon: Secondary | ICD-10-CM | POA: Insufficient documentation

## 2021-12-06 DIAGNOSIS — Z01812 Encounter for preprocedural laboratory examination: Secondary | ICD-10-CM | POA: Diagnosis present

## 2021-12-06 LAB — BASIC METABOLIC PANEL
Anion gap: 7 (ref 5–15)
BUN: 20 mg/dL (ref 8–23)
CO2: 28 mmol/L (ref 22–32)
Calcium: 9.2 mg/dL (ref 8.9–10.3)
Chloride: 104 mmol/L (ref 98–111)
Creatinine, Ser: 0.86 mg/dL (ref 0.44–1.00)
GFR, Estimated: >60 mL/min (ref >60)
Glucose, Bld: 101 mg/dL — ABNORMAL HIGH (ref 70–99)
Potassium: 4.2 mmol/L (ref 3.5–5.1)
Sodium: 139 mmol/L (ref 135–145)

## 2021-12-10 ENCOUNTER — Encounter (HOSPITAL_COMMUNITY)
Admission: RE | Disposition: A | Discharge status: Home / Self Care | Payer: Self-pay | Source: Home / Self Care | Attending: Gastroenterology

## 2021-12-10 ENCOUNTER — Ambulatory Visit (HOSPITAL_COMMUNITY): Payer: Medicare HMO | Admitting: Anesthesiology

## 2021-12-10 ENCOUNTER — Ambulatory Visit (HOSPITAL_COMMUNITY)
Admission: RE | Admit: 2021-12-10 | Discharge: 2021-12-10 | Disposition: A | Discharge status: Home / Self Care | Payer: Medicare HMO | Attending: Gastroenterology | Admitting: Gastroenterology

## 2021-12-10 ENCOUNTER — Ambulatory Visit (HOSPITAL_BASED_OUTPATIENT_CLINIC_OR_DEPARTMENT_OTHER): Payer: Medicare HMO | Admitting: Anesthesiology

## 2021-12-10 ENCOUNTER — Encounter (HOSPITAL_COMMUNITY): Payer: Self-pay | Admitting: Gastroenterology

## 2021-12-10 DIAGNOSIS — D124 Benign neoplasm of descending colon: Secondary | ICD-10-CM | POA: Diagnosis not present

## 2021-12-10 DIAGNOSIS — Z8673 Personal history of transient ischemic attack (TIA), and cerebral infarction without residual deficits: Secondary | ICD-10-CM | POA: Insufficient documentation

## 2021-12-10 DIAGNOSIS — I10 Essential (primary) hypertension: Secondary | ICD-10-CM | POA: Insufficient documentation

## 2021-12-10 DIAGNOSIS — Z87891 Personal history of nicotine dependence: Secondary | ICD-10-CM | POA: Diagnosis not present

## 2021-12-10 DIAGNOSIS — K635 Polyp of colon: Secondary | ICD-10-CM

## 2021-12-10 DIAGNOSIS — F419 Anxiety disorder, unspecified: Secondary | ICD-10-CM | POA: Insufficient documentation

## 2021-12-10 DIAGNOSIS — E785 Hyperlipidemia, unspecified: Secondary | ICD-10-CM | POA: Diagnosis not present

## 2021-12-10 DIAGNOSIS — J449 Chronic obstructive pulmonary disease, unspecified: Secondary | ICD-10-CM | POA: Insufficient documentation

## 2021-12-10 DIAGNOSIS — K5730 Diverticulosis of large intestine without perforation or abscess without bleeding: Secondary | ICD-10-CM | POA: Insufficient documentation

## 2021-12-10 DIAGNOSIS — D123 Benign neoplasm of transverse colon: Secondary | ICD-10-CM | POA: Diagnosis not present

## 2021-12-10 DIAGNOSIS — Z1211 Encounter for screening for malignant neoplasm of colon: Secondary | ICD-10-CM | POA: Diagnosis present

## 2021-12-10 DIAGNOSIS — K649 Unspecified hemorrhoids: Secondary | ICD-10-CM

## 2021-12-10 DIAGNOSIS — Z6837 Body mass index (BMI) 37.0-37.9, adult: Secondary | ICD-10-CM | POA: Diagnosis not present

## 2021-12-10 DIAGNOSIS — G4733 Obstructive sleep apnea (adult) (pediatric): Secondary | ICD-10-CM | POA: Diagnosis not present

## 2021-12-10 DIAGNOSIS — K648 Other hemorrhoids: Secondary | ICD-10-CM | POA: Diagnosis not present

## 2021-12-10 DIAGNOSIS — F32A Depression, unspecified: Secondary | ICD-10-CM | POA: Diagnosis not present

## 2021-12-10 DIAGNOSIS — K219 Gastro-esophageal reflux disease without esophagitis: Secondary | ICD-10-CM | POA: Diagnosis not present

## 2021-12-10 HISTORY — PX: COLONOSCOPY WITH PROPOFOL: SHX5780

## 2021-12-10 HISTORY — PX: BIOPSY: SHX5522

## 2021-12-10 HISTORY — PX: POLYPECTOMY: SHX5525

## 2021-12-10 LAB — HM COLONOSCOPY

## 2021-12-10 SURGERY — COLONOSCOPY WITH PROPOFOL
Anesthesia: General

## 2021-12-10 MED ORDER — PROPOFOL 500 MG/50ML IV EMUL
INTRAVENOUS | Status: DC | PRN
  Administered 2021-12-10: 110 ug/kg/min via INTRAVENOUS
  Administered 2021-12-10: 90 ug/kg/min via INTRAVENOUS

## 2021-12-10 MED ORDER — LIDOCAINE HCL (CARDIAC) PF 50 MG/5ML IV SOSY
PREFILLED_SYRINGE | INTRAVENOUS | Status: DC | PRN
  Administered 2021-12-10: 50 mg via INTRAVENOUS

## 2021-12-10 MED ORDER — LACTATED RINGERS IV SOLN: INTRAVENOUS | Status: DC

## 2021-12-10 MED ORDER — PROPOFOL 10 MG/ML IV BOLUS
INTRAVENOUS | Status: DC | PRN
  Administered 2021-12-10: 130 mg via INTRAVENOUS
  Administered 2021-12-10: 20 mg via INTRAVENOUS

## 2021-12-10 NOTE — H&P (Signed)
Jenna Hancock is an 61 y.o. female.   Chief Complaint: Colorectal cancer screening HPI: 61 year old female with past medical history of depression, hypertension, GERD, hyperlipidemia, hypertension, stroke, OSA, coming for screening colonoscopy.  Last colonoscopy was performed 10 years ago which was normal per patient.  The patient denies having any complaints such as melena, hematochezia, abdominal pain or distention, change in her bowel movement consistency or frequency, no changes in her weight recently.  No family history of colorectal cancer.   Past Medical History:  Diagnosis Date   Anxiety    Arthritis    Depression    Essential hypertension, benign 12/08/2016   GERD (gastroesophageal reflux disease)    Heart murmur    AS A CHILD    High cholesterol 12/08/2016   Hyperlipidemia    Hypertension    PONV (postoperative nausea and vomiting)    Sleep apnea    DOES NOT USE CPAP    Stroke (HCC)    HX OF MINI STROKE 2016    TIA (transient ischemic attack) 2017    Past Surgical History:  Procedure Laterality Date   APPENDECTOMY     JOINT REPLACEMENT     rt knee 11 yrs ago   KNEE ARTHROSCOPY Left    NASAL SINUS SURGERY     TOTAL KNEE ARTHROPLASTY Left 02/01/2020   Procedure: TOTAL KNEE ARTHROPLASTY;  Surgeon: Jene Every, MD;  Location: [facility];  Service: Orthopedics;  Laterality: Left;  general or spinal    Family History  Problem Relation Age of Onset   Diabetes Father    Stroke Father    Heart disease Father    Lung cancer Mother    Social History:  reports that she quit smoking about 2 months ago. Her smoking use included cigarettes. She started smoking about 40 years ago. She has a 34.00 pack-year smoking history. She has never used smokeless tobacco. She reports that she does not drink alcohol and does not use drugs.  Allergies:  Allergies  Allergen Reactions   Bactrim Rash   Sulfa Antibiotics Rash    Medications Prior to Admission  Medication Sig Dispense  Refill   albuterol (PROVENTIL) (2.5 MG/3ML) 0.083% nebulizer solution Take 3 mLs (2.5 mg total) by nebulization every 6 (six) hours as needed for wheezing or shortness of breath. 75 mL 12   ALPRAZolam (XANAX) 0.5 MG tablet Take 0.5 mg by mouth 2 (two) times daily as needed for anxiety.      amLODipine (NORVASC) 5 MG tablet Take 5 mg by mouth daily.     aspirin 325 MG tablet Take 325 mg by mouth at bedtime.     atorvastatin (LIPITOR) 80 MG tablet Take 80 mg by mouth daily.     ezetimibe (ZETIA) 10 MG tablet Take 10 mg by mouth every evening.     FLUoxetine (PROZAC) 40 MG capsule Take 80 mg by mouth daily.     folic acid (FOLVITE) 800 MCG tablet Take 800 mcg by mouth daily.     HYDROcodone-acetaminophen (NORCO) 10-325 MG tablet Take 1 tablet by mouth in the morning and at bedtime.     irbesartan-hydrochlorothiazide (AVALIDE) 300-12.5 MG per tablet Take 1 tablet by mouth daily.     metoprolol tartrate (LOPRESSOR) 25 MG tablet Take 25 mg by mouth 2 (two) times daily.     Multiple Vitamins-Minerals (MULTIVITAMIN WITH MINERALS) tablet Take 1 tablet by mouth daily.     Omega 3 1200 MG CAPS Take 1,200 mg by mouth daily.  pantoprazole (PROTONIX) 40 MG tablet Take 30- 60 min before your first and last meals of the day (Patient taking differently: Take 40 mg by mouth daily.)     albuterol (PROVENTIL HFA;VENTOLIN HFA) 108 (90 Base) MCG/ACT inhaler Inhale 1-2 puffs into the lungs 4 (four) times daily as needed for shortness of breath or wheezing.     nicotine (NICODERM CQ - DOSED IN MG/24 HOURS) 21 mg/24hr patch Place 1 patch (21 mg total) onto the skin daily. (Patient not taking: Reported on 10/18/2021) 30 patch 1   Tiotropium Bromide-Olodaterol (STIOLTO RESPIMAT) 2.5-2.5 MCG/ACT AERS Inhale 2 puffs into the lungs daily. (Patient taking differently: Inhale 2 puffs into the lungs daily as needed (shortness of breath).) 4 g 0    No results found for this or any previous visit (from the past 48 hour(s)). No  results found.  Review of Systems  Constitutional: Negative.   HENT: Negative.    Eyes: Negative.   Respiratory: Negative.    Cardiovascular: Negative.   Gastrointestinal: Negative.   Endocrine: Negative.   Genitourinary: Negative.   Musculoskeletal: Negative.   Skin: Negative.   Allergic/Immunologic: Negative.   Neurological: Negative.   Hematological: Negative.   Psychiatric/Behavioral: Negative.      Blood pressure (!) 131/58, pulse (!) 55, temperature 97.8 F (36.6 C), resp. rate 19, SpO2 98 %. Physical Exam  GENERAL: The patient is AO x3, in no acute distress. HEENT: Head is normocephalic and atraumatic. EOMI are intact. Mouth is well hydrated and without lesions. NECK: Supple. No masses LUNGS: Clear to auscultation. No presence of rhonchi/wheezing/rales. Adequate chest expansion HEART: RRR, normal s1 and s2. ABDOMEN: Soft, nontender, no guarding, no peritoneal signs, and nondistended. BS +. No masses. EXTREMITIES: Without any cyanosis, clubbing, rash, lesions or edema. NEUROLOGIC: AOx3, no focal motor deficit. SKIN: no jaundice, no rashes  Assessment/Plan 61 year old female with past medical history of depression, hypertension, GERD, hyperlipidemia, hypertension, stroke, OSA, coming for screening colonoscopy.  The patient is at average risk for colorectal cancer.  We will proceed with colonoscopy today.   Dolores Frame, MD 12/10/2021, 8:26 AM

## 2021-12-10 NOTE — Transfer of Care (Signed)
Immediate Anesthesia Transfer of Care Note  Patient: Jenna Hancock  Procedure(s) Performed: COLONOSCOPY WITH PROPOFOL POLYPECTOMY BIOPSY  Patient Location: Short Stay  Anesthesia Type:General  Level of Consciousness: awake and patient cooperative  Airway & Oxygen Therapy: Patient Spontanous Breathing  Post-op Assessment: Report given to RN and Post -op Vital signs reviewed and stable  Post vital signs: Reviewed and stable  Last Vitals:  Vitals Value Taken Time  BP 108/86 12/10/21 0926  Temp 36.3 C 12/10/21 0926  Pulse 50 12/10/21 0926  Resp 11 12/10/21 0926  SpO2 93 % 12/10/21 0926    Last Pain:  Vitals:   12/10/21 0926  TempSrc: Oral  PainSc: 0-No pain         Complications: No notable events documented.

## 2021-12-11 ENCOUNTER — Encounter (INDEPENDENT_AMBULATORY_CARE_PROVIDER_SITE_OTHER): Payer: Self-pay | Admitting: *Deleted

## 2021-12-11 LAB — SURGICAL PATHOLOGY

## 2021-12-11 NOTE — Progress Notes (Signed)
This was d/w pt by MR at recent visit.

## 2021-12-13 NOTE — Anesthesia Postprocedure Evaluation (Signed)
Anesthesia Post Note  Patient: CHENEL WERNLI  Procedure(s) Performed: COLONOSCOPY WITH PROPOFOL POLYPECTOMY BIOPSY  Patient location during evaluation: Phase II Anesthesia Type: General Level of consciousness: awake Pain management: pain level controlled Vital Signs Assessment: post-procedure vital signs reviewed and stable Respiratory status: spontaneous breathing and respiratory function stable Cardiovascular status: blood pressure returned to baseline and stable Postop Assessment: no headache and no apparent nausea or vomiting Anesthetic complications: no Comments: Late entry   No notable events documented.   Last Vitals:  Vitals:   12/10/21 0730 12/10/21 0926  BP: (!) 131/58 108/86  Pulse: (!) 55 (!) 50  Resp: 19 11  Temp: 36.6 C (!) 36.3 C  SpO2: 98% 93%    Last Pain:  Vitals:   12/10/21 0926  TempSrc: Oral  PainSc: 0-No pain                 Louann Sjogren

## 2021-12-16 ENCOUNTER — Encounter (HOSPITAL_COMMUNITY): Payer: Self-pay | Admitting: Gastroenterology

## 2022-02-01 ENCOUNTER — Telehealth: Payer: Self-pay | Admitting: Pulmonary Disease

## 2022-02-01 ENCOUNTER — Other Ambulatory Visit: Payer: Self-pay | Admitting: Pulmonary Disease

## 2022-02-01 MED ORDER — PREDNISONE 20 MG PO TABS: 20.0000 mg | ORAL_TABLET | Freq: Every day | ORAL | 0 refills | Status: DC

## 2022-02-01 MED ORDER — AZITHROMYCIN 250 MG PO TABS: 250.0000 mg | ORAL_TABLET | Freq: Every day | ORAL | 0 refills | Status: DC

## 2022-02-01 NOTE — Progress Notes (Signed)
Patient called back to answering service called in prednisone and azithromycin

## 2022-02-01 NOTE — Telephone Encounter (Signed)
Patient called answering service for fever and feeling poorly  Called back, went to voicemail. Encouraged to call back answering service if still struggling

## 2022-02-27 ENCOUNTER — Telehealth: Payer: Self-pay | Admitting: Internal Medicine

## 2022-02-28 NOTE — Telephone Encounter (Signed)
Spoke with pt who states yesterday she had GI upset and O2 saturations were staying between 89-93%. Pt states she fills better today. Pt educated on triturating O2 to maintain saturation above 90% at all times. Pt states if she started feeling bad again she would call office on Monday. Nothing further needed at this time.

## 2022-03-03 ENCOUNTER — Telehealth: Payer: Self-pay | Admitting: Internal Medicine

## 2022-03-03 DIAGNOSIS — J441 Chronic obstructive pulmonary disease with (acute) exacerbation: Secondary | ICD-10-CM

## 2022-03-03 MED ORDER — AMOXICILLIN-POT CLAVULANATE 875-125 MG PO TABS: 1.0000 | ORAL_TABLET | Freq: Two times a day (BID) | ORAL | 0 refills | Status: DC

## 2022-03-03 MED ORDER — PREDNISONE 20 MG PO TABS: 40.0000 mg | ORAL_TABLET | Freq: Every day | ORAL | 0 refills | Status: DC

## 2022-03-03 NOTE — Telephone Encounter (Signed)
Prednisone and augmentin sent to pharmacy. Please advise her to keep appt with MR later this week as this is her second flare in the last month.

## 2022-03-03 NOTE — Telephone Encounter (Signed)
Patient is sick again- having same symptoms as previous encounter from last week which started back on Saturday. Fever of 102, cough, o2 levels 89-92 (o2 levels drop when patient has fever). Has an appointment Friday 9/21 but needs something called in as soon as possible to [facility] in [place].  Please advise.

## 2022-03-03 NOTE — Telephone Encounter (Signed)
Called patietn this morning and she states she started feeling sick again. Same as last week. Started up again Saturday. Diarrhea noted. Fever noted to be 102 on Saturday. Nebulizer treatment on Monday. Pt states her fever has been off and on but she is taking Tylenol OTC and she has no fever noted today so far.   Covid hometest yesterday was negative.   Pt has office visit on Friday 9/21 with Dr Chase Caller.  She states MR normally sends in prednisone and ABT for her.   Please advise sir

## 2022-03-03 NOTE — Telephone Encounter (Signed)
Called and spoke to patient advised her on medication. Nothing furhter needed

## 2022-03-03 NOTE — Telephone Encounter (Signed)
Called patietn this morning and she states she started feeling sick again. Same as last week. Started up again Saturday. Diarrhea noted. Fever noted to be 102 on Saturday. Nebulizer treatment on Monday. Pt states her fever has been off and on but she is taking Tylenol OTC and she has no fever noted today so far.    Covid hometest yesterday was negative.    Pt has office visit on Friday 9/21 with Dr Chase Caller.   She states MR normally sends in prednisone and ABT for her.    Please advise Dr Shearon Stalls since MR left without looking at message

## 2022-03-05 NOTE — Telephone Encounter (Signed)
Thanks. I will address her recc aecopd at follwup visit. nNothing furhter needed

## 2022-03-06 ENCOUNTER — Ambulatory Visit (INDEPENDENT_AMBULATORY_CARE_PROVIDER_SITE_OTHER): Payer: Medicare HMO

## 2022-03-06 ENCOUNTER — Encounter: Payer: Self-pay | Admitting: Internal Medicine

## 2022-03-06 ENCOUNTER — Ambulatory Visit (INDEPENDENT_AMBULATORY_CARE_PROVIDER_SITE_OTHER): Payer: Medicare HMO | Admitting: Internal Medicine

## 2022-03-06 ENCOUNTER — Ambulatory Visit: Payer: Medicare HMO | Admitting: Internal Medicine

## 2022-03-06 VITALS — BP 138/62 | HR 77 | Temp 98.0°F | Ht 63.5 in | Wt 215.0 lb

## 2022-03-06 DIAGNOSIS — F17210 Nicotine dependence, cigarettes, uncomplicated: Secondary | ICD-10-CM

## 2022-03-06 DIAGNOSIS — J189 Pneumonia, unspecified organism: Secondary | ICD-10-CM | POA: Diagnosis not present

## 2022-03-06 DIAGNOSIS — J441 Chronic obstructive pulmonary disease with (acute) exacerbation: Secondary | ICD-10-CM

## 2022-03-06 LAB — PULMONARY FUNCTION TEST
DL/VA % pred: 99 %
DL/VA: 4.22 ml/min/mmHg/L
DLCO cor % pred: 87 %
DLCO cor: 17.37 ml/min/mmHg
DLCO unc % pred: 87 %
DLCO unc: 17.37 ml/min/mmHg
FEF 25-75 Post: 2.02 L/sec
FEF 25-75 Pre: 1.11 L/sec
FEF2575-%Change-Post: 82 %
FEF2575-%Pred-Post: 88 %
FEF2575-%Pred-Pre: 48 %
FEV1-%Change-Post: 17 %
FEV1-%Pred-Post: 68 %
FEV1-%Pred-Pre: 57 %
FEV1-Post: 1.69 L
FEV1-Pre: 1.44 L
FEV1FVC-%Change-Post: 8 %
FEV1FVC-%Pred-Pre: 96 %
FEV6-%Change-Post: 9 %
FEV6-%Pred-Post: 66 %
FEV6-%Pred-Pre: 61 %
FEV6-Post: 2.07 L
FEV6-Pre: 1.9 L
FEV6FVC-%Pred-Post: 103 %
FEV6FVC-%Pred-Pre: 103 %
FVC-%Change-Post: 8 %
FVC-%Pred-Post: 64 %
FVC-%Pred-Pre: 59 %
FVC-Post: 2.07 L
FVC-Pre: 1.91 L
Post FEV1/FVC ratio: 82 %
Post FEV6/FVC ratio: 100 %
Pre FEV1/FVC ratio: 75 %
Pre FEV6/FVC Ratio: 100 %
RV % pred: 185 %
RV: 3.68 L
TLC % pred: 117 %
TLC: 5.84 L

## 2022-03-06 MED ORDER — BREZTRI AEROSPHERE 160-9-4.8 MCG/ACT IN AERO: 2.0000 | INHALATION_SPRAY | Freq: Two times a day (BID) | RESPIRATORY_TRACT | 0 refills | Status: DC

## 2022-03-06 MED ORDER — ROFLUMILAST 500 MCG PO TABS: 500.0000 ug | ORAL_TABLET | Freq: Every day | ORAL | 11 refills | Status: DC

## 2022-03-06 MED ORDER — ROFLUMILAST 250 MCG PO TABS
1.0000 | ORAL_TABLET | Freq: Every day | ORAL | 0 refills | Status: DC
Start: 2022-03-06 — End: 2022-07-18

## 2022-03-06 NOTE — Patient Instructions (Signed)
Full PFT performed today. °

## 2022-03-06 NOTE — Patient Instructions (Addendum)
ICD-10-CM   1. COPD with acute exacerbation ([place])  J44.1     2. COPD, frequent exacerbations ([place])  J44.1     3. Cigarette smoker  F17.210      Now with another flare up that is improving with current flare up - midway through outpatient treatment   You are prone to flareups because of previous cigarette smoking, also exposure to the chicken coop  Lung functipn has declined to 57% from 82% in 2018  Plan  -Change Stiolto to BREZTRI 2 puff twice daily - Take new tablet called roflumilast 1 tablet   - starter pack 270mg per day after food x 30 days or 1 month  - then escalate to 5031m daily after food x continue at this dose  - any side effects especially GI [place] Smoking 100% -Avoid chicken coop exposure -get cxr 2 view 03/06/2022 - finish current antibipotics and prednisone for flare up  Follow-up - Return to see Dr. RaChase Callern 3 months  - consider ARNASA study in future

## 2022-03-06 NOTE — Progress Notes (Signed)
Subjective:     Patient ID: Jenna Hancock, female   DOB: February 28, 1961, 61 y.o.   MRN: [id number]  HPI PCP Pomposini, Cherly Anderson, MD   HPI   IOV 03/19/2017  Chief Complaint  Patient presents with   Advice Only    Abnormal cxr 01/22/17 and due to that, pt requested to come to our office for appt. C/o SOB on exertion with occ. dry cough and occ. chest tightness. Pt stated that in May and June she was cleaning her place out due to having rat infestation and became real sick then and hasn't felt the same since.   Jenna Hancock [birth year] : 61 year old female whose mother Edlin Ford that I take care of for COPD. Patient herself is a smoker greater than 30 pack per day. But at baseline she has never had any respiratory issues. [place] with her boyfriend who is here with her today Then approximately the first week of May 2018: She took it upon herself to enter and closed small room that are not being visited in 1 year. The room contained chicken feed. There was significant rat infestation. She used a leaf blower without a mass and for 3 hours guarded of the rats. During this time a lot of rat feces she inhale and was exposed to. One day after this she started having fever and wheezing and shortness of breath and feeling extremely rundown. Then on 10/26/2016 She went to the ER in May 2018 with fever and generalized body aches following tick bite 2. She did not tell the emergency room doctors about exposure to rat feces because of embarrassment.  During this time wheezing was noticed on the physical exam. She was discharged on doxycycline. It looks like she did not get prednisone. After this she reports she's been to other urgent care but did not get prednisone and steroids. Then in June 2018 she saw primary care for chronic diarrhea At this visit she reported she lost 22 pounds. C. difficile test was negative. Then in 02/01/2017 she went to the emergency department because of shortness  of breath after mowing grass on a hot humid day. In the emergency room she did not have wheezing. She was discharged after albuterol and dexamethasone taper. She tells me that till this day that she is much better but she still has wheezing and shortness of breath and feeling fatigued and is not fully better. She is extremely worried about her respiratory issues    Chest x-ray 02/01/2017: Reported a COPD without any abnormality but to me on my personal visualization is only mildly hyperinflated but otherwise clear  Blood lab work 01/22/2017: Troponin normal, BMP normal, liver function test normal and creatinine normal and white count normal  Walking desaturation test 185 feet 3 laps on room air with a full head probe: Resting heart rate 57/m. Peak heart rate 85/m. Resting pulse ox 100%. Final pulse ox 99% and she became short of breath. She did not desaturate.   Exhaled nitric oxide in the office 03/19/2017: 7 ppb  Spirometry 03/19/2017 -> fev1 1/9L/70%, Ratio 76 - office spirometry  Past medical history review shows that she's had a history of pneumonia, sinus infections and sinus nasal surgeries.      06/11/2017 acute extended ov/[place] re:  Chief Complaint  Patient presents with   Acute Visit    Pt c/o increased SOB, wheezing, chest tightness and prod cough with minimal green sputum- onset was 6  days ago. She had fever 1 day ago.   cough off and on since May 2018 while on breo and using lots of saba / assoc chest tight better p saba  Shoemaker eval 2 week prior to [place]  Did not rec abx  Acute worse cough with purulent sputum esp in am since 06/05/17 assoc with nasal congestion/ sore throat/    sob with watery nasal d/c  rx pred / omnicef 12/21/- 12/26 and no better    No obvious day to day or daytime variability or assoc    mucus plugs or hemoptysis or cp or  or overt   hb symptoms. No unusual exposure hx or h/o childhood pna/ asthma or knowledge of premature birth.  Sleeping ok flat  without nocturnal    exacerbation  of respiratory  c/o's or need for noct saba. Also denies any obvious fluctuation of symptoms with weather or environmental changes or other aggravating or alleviating factors except as outlined above    OV 06/25/2017  Chief Complaint  Patient presents with   Follow-up    Pt [facility] and was dx with bronchitis. Pt went to ENT, Dr. Wilburn Cornelia and is currently on an abx. Pt has complaints of a cough with yellow mucus. Denies any SOB or CP   [name] returns for follow-up.  She has respiratory symptoms following rat  antigen exposure in May 2018.  She continues to smoke and she continues to have respiratory symptoms.  Around Christmas 2018 she started feeling sick.  June 11, 2017 she saw my colleague Dr. Christinia Gully who advised her to stop smoking given antibiotic and prednisone.  Did a chest x-ray that I personally visualized that shows bronchitic changes but also my opinion that might be interstitial lung disease changes.  She started feeling better with and followed up with ENT doctor Dr. Wilburn Cornelia who has her on prolonged antibiotic therapy right now.  Overall she is better but she feels stable.  Nevertheless she feels she is a new new low baseline.  She feels fatigued all the time.  She is frustrated with  symptomatology but she continues to smoke.   PFT 04/22/17 - normal FENO 12 ppb 06/25/2017   OV 11/23/2017  Chief Complaint  Patient presents with   Follow-up    Pt states she has been doing well since last visit and denies any complaints or concerns.    Jenna Hancock , 61 y.o. , with dob 05/12/1961 and female ,Not Hispanic or Latino from [address] [place] [address] - presents to pulm clinic for cough and resp symptoms due to smoking and chicken coop expisure with rapid antigen.  At the end of last visit in January 2019 we did a high-resolution CT scan of the chest and this shows RB ILD findings.  Autoimmune and hypersensitivity  pneumonitis panel was negative.  Currently she says she has cut down on smoking and with this her respiratory symptoms have improved.  In addition she also has cut down exposure to the chicken coop.  In fact she has eliminated this.  She still has some symptoms that are described below on the CAT score even though she does not have COPD.  The symptoms are relatively new compared to the pre-exposure days but they are improving.  Albuterol does help this.  We discussed quitting smoking because she still continues to smoke.  Several years ago she quit with the help of Chantix for 1-1/2 years but then relapsed.  She says Chantix because her $200 a month while she is spending only 25 or $30 a month for smoking.  She is try to get a lower price on the Chantix.  Apparently a friend of hers is trying to help her with the discount program.  We did give her a discount card.       OV 08/17/2018  Subjective:  Patient ID: Jenna Hancock, female , DOB: 07/25/1960 , age 78 y.o. , MRN: [id number] , ADDRESS: [place] [address]   08/17/2018 -   Chief Complaint  Patient presents with   Follow-up    Pt states she has been doing well since last visit and denies any concerns.  FU smoking FU RB-ILD  HPI SARAHBETH CASHIN 61 y.o. -returns for follow-up.  Last seen June 2019.  With the help of Chantix she quit smoking but the remission last 3 months and approximately 2 months ago she started smoking a cigarette here and there when she tapered her Chantix down to once a day.  Now for the last 2 weeks she is gone back up on the Chantix to twice daily and smoking has been in remission.  Nevertheless these efforts have yielded significant improvement in symptoms and she feels good.  She is trying to avoid the chicken coop.  She reports no new problems.  Chart review shows that she had coronary artery calcification on the previous CT scan of the chest but she denies any chest pain or shortness of breath or wheezing or  cough.    CAT COPD Symptom & Quality of Life Score (GSK trademark) 0 is no burden. 5 is highest burden 11/23/2017   Never Cough -> Cough all the time 3  No phlegm in chest -> Chest is full of phlegm 1  No chest tightness -> Chest feels very tight 2  No dyspnea for 1 flight stairs/hill -> Very dyspneic for 1 flight of stairs 3  No limitations for ADL at home -> Very limited with ADL at home 0  Confident leaving home -> Not at all confident leaving home -0  Sleep soundly -> Do not sleep soundly because of lung condition 2  Lots of Energy -> No energy at all 2  TOTAL Score (max 40)  13    CT chest IMPRESSION: 1. Minimal patchy ground-glass centrilobular micronodularity in the upper lobes. 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 2. Mild to moderate patchy air trapping in both lungs, indicative of small airways disease. 3. One vessel coronary atherosclerosis. 4. Cholelithiasis.   Aortic Atherosclerosis (ICD10-I70.0).     Electronically Signed   By: Ilona Sorrel M.D.   On: 07/07/2017 17:08  ROS - per HPI   OV 02/15/2021  Subjective:  Patient ID: Jenna Hancock, female , DOB: 15-Feb-1961 , age 48 y.o. , MRN: [id number] , ADDRESS: [place] [id number] PCP Dannial Monarch, FNP Patient Care Team: Dannial Monarch, FNP as PCP - General (Nurse Practitioner)  This Provider for this visit: Treatment Team:  Attending Provider: Brand Males, MD    02/15/2021 -   Chief Complaint  Patient presents with   Follow-up    Pt states she has been doing okay since last visit and denies any complaints.     HPI KRISTON MCKINNIE 61 y.o. -returns for follow-up of her smoking and RB ILD upper lobe groundglass nodules.  Last CT scan was in 2019.  She  had a CT scan of the chest in 2022.  Radiology did not compare but according to the report it seems similar without any change.  She is denying any chest  pain or shortness of breath or coughing or wheezing.  She is relapsed into smoking.  She wants another Chantix prescription if it still available on the market.  I will make this.  She denies any suicidal or homicidal ideations.  Denies any nightmares prior with Chantix.  No firearms in the house.  Of note her mother had COVID.  Be treated with antiviral.  Now she saying the mother has green nasal drainage for the last day.  She is wondering about antibiotics because the mother is also coughing more from the nasal drainage.  I made a separate phone note about that for Cindee Salt.    CT Chest data 7/322  Narrative & Impression  CLINICAL DATA:  Pneumonia.   EXAM: CT CHEST WITHOUT CONTRAST   TECHNIQUE: Multidetector CT imaging of the chest was performed following the standard protocol without IV contrast.   COMPARISON:  Radiograph same day   FINDINGS: Cardiovascular: No significant vascular findings. Normal heart size. No pericardial effusion. Scattered calcifications of the thoracic aorta.   Mediastinum/Nodes: Paratracheal lymph nodes are prominent but not pathologically enlarged 6 7 mm. Supraclavicular nodes.   Lungs/Pleura: There is bilateral patchy ground-glass opacities within the upper and lower lobes. No discrete nodularity. No pleural fluid. Airways normal.   Upper Abdomen: Limited view of the liver, kidneys, pancreas are unremarkable. Normal adrenal glands.   Musculoskeletal: No aggressive osseous lesion.   IMPRESSION: 1. Bilateral patchy ground-glass opacities without consolidation or nodularity. Nonspecific finding with differential including pulmonary edema, multifocal atypical pneumonia including viral pneumonia or pneumonitis. 2. Small paratracheal lymph nodes are presumably reactive.     Electronically Signed   By: Suzy Bouchard M.D.   On: 12/16/2020 13:52      OV 12/03/2021  Subjective:  Patient ID: Jenna Hancock, female , DOB: 1960/07/06 , age  37 y.o. , MRN: [id number] , ADDRESS: [place] [id number] PCP Pomposini, Cherly Anderson, MD Patient Care Team: Pomposini, Cherly Anderson, MD as PCP - General (Internal Medicine) Donato Heinz, MD as PCP - Cardiology (Cardiology)  This Provider for this visit: Treatment Team:  Attending Provider: Brand Males, MD    12/03/2021 -   Chief Complaint  Patient presents with   Follow-up    Pt states she is doing much better compared to last visit.    I Jenna Hancock 61 y.o. -returns for follow-up.  Since her last saw her she saw my colleague Dr. Prudence Davidson and was admitted for rhinovirus related COPD exacerbation multifocal viral pneumonia.  According to husband she lets her sinus infections linger too long and not seek early treatment.  I am meeting him for the first time.  He wanted to expressed the sentiment specifically.  She does admit to this.  In addition she does get exposed to the chicken coop.  She says she is cut down on the exposures and wears a mask but still she does it once in every 3 months.  However she has quit smoking 1 week before the hospitalization and the smoking is still in remission.  Review of the medication shows she is on Stiolto but also taking omega-3 fatty oil.  She denies any active acid reflux.  I did explain to her omega-3 fatty oil can cause acid reflux.  This is a risk  factor for flareups of COPD or lung disease.  We discussed about sinus hygiene measures.  She had a high-resolution CT chest that shows near resolution of her pulmonary infiltrates.  No obvious emphysema described    CT Chest data - HRCT 11/27/21  IMPRESSION: 1. Previously seen widespread, somewhat geographic airspace opacity throughout the lungs is almost completely resolved, with only scattered areas of residual ground-glass, for example in the left apex and in the superior segment left lower lobe. Findings are consistent with resolution of infection or inflammation. 2. No  residual evidence of fibrotic interstitial lung disease. 3. Mild lobular air trapping on expiratory phase imaging, suggestive of small airways disease. 4. Coronary artery disease. 5. Cholelithiasis.   Aortic Atherosclerosis (ICD10-I70.0).     Electronically Signed   By: Delanna Ahmadi M.D.   On: 11/29/2021 12:47  No results found.    PFT  OV 03/06/2022  Subjective:  Patient ID: Jenna Hancock, female , DOB: Dec 17, 1960 , age 73 y.o. , MRN: [id number] , ADDRESS: [place] [id number] PCP Pomposini, Cherly Anderson, MD Patient Care Team: Pomposini, Cherly Anderson, MD as PCP - General (Internal Medicine) Donato Heinz, MD as PCP - Cardiology (Cardiology)  This Provider for this visit: Treatment Team:  Attending Provider: Brand Males, MD  R BLD resolved in June 2023 high-res CT chest. Smoking Behaves like COPD because of previous smoking  -with recurrent exacerbation, family history, exacerbations with wheeze  -Last PFT 2018 that was normal and no emphysema described  HP  03/06/2022 -   Chief Complaint  Patient presents with   Follow-up    PFT performed today.  Pt states she has been doing okay since last visit. States she is feeling better compared to last visit. Has a mild cough.     HPI MILAYA HORA 61 y.o. -returns for follow-up.  She is in the middle of another exacerbation.  She is on antibiotic Augmentin and also prednisone.  She feels remarkably better.  She states she has nearly quit smoking but does admit that she smoking a few puffs here and there.  She also continues to get exposed to the chicken coop even though she says she wears a mask.  She had pulmonary function test.  The previous one was in 2018.  This marked decline in the pulmonary function testing.  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  DLCO still normal.  Her June 2023 CT chest showed resolution of her RB ILD.  There is no emphysema reported but she is behaving like COPD.  She has  quit her fish oil upon my advice.  Nevertheless she still getting frequent exacerbations.  She is on Stiolto.  She is not on Daliresp and her mother is on it.  She asked me about this medication.  She is also not on triple inhaler therapy.    CT Chest data - HRCT 11/27/21  Narrative & Impression  CLINICAL DATA:  Interstitial lung disease, follow-up pneumonia   EXAM: CT CHEST WITHOUT CONTRAST   TECHNIQUE: Multidetector CT imaging of the chest was performed following the standard protocol without intravenous contrast. High resolution imaging of the lungs, as well as inspiratory and expiratory imaging, was performed.   RADIATION DOSE REDUCTION: This exam was performed according to the departmental dose-optimization program which includes automated exposure control, adjustment of the mA and/or kV according to patient size and/or use of iterative reconstruction technique.   COMPARISON:  10/09/2021   FINDINGS: Cardiovascular: Aortic  atherosclerosis. Normal heart size. Scattered left coronary artery calcifications. No pericardial effusion.   Mediastinum/Nodes: No enlarged mediastinal, hilar, or axillary lymph nodes. Thyroid gland, trachea, and esophagus demonstrate no significant findings.   Lungs/Pleura: Previously seen widespread, somewhat geographic airspace opacity throughout the lungs is almost completely resolved, with only scattered areas of residual ground-glass, for example in the left apex (series 6, image 25) and in the superior segment left lower lobe (series 6, image 112). Mild lobular air trapping on expiratory phase imaging. No pleural effusion or pneumothorax.   Upper Abdomen: No acute abnormality.  Rim calcified gallstone.   Musculoskeletal: No chest wall abnormality. No suspicious osseous lesions identified.   IMPRESSION: 1. Previously seen widespread, somewhat geographic airspace opacity throughout the lungs is almost completely resolved, with only scattered  areas of residual ground-glass, for example in the left apex and in the superior segment left lower lobe. Findings are consistent with resolution of infection or inflammation. 2. No residual evidence of fibrotic interstitial lung disease. 3. Mild lobular air trapping on expiratory phase imaging, suggestive of small airways disease. 4. Coronary artery disease. 5. Cholelithiasis.   Aortic Atherosclerosis (ICD10-I70.0).     Electronically Signed   By: Delanna Ahmadi M.D.   On: 11/29/2021 12:47    No results found.    PFT     Latest Ref Rng & Units 03/06/2022    9:02 AM 04/22/2017   10:52 AM  PFT Results  FVC-Pre L 1.91  P 2.73   FVC-Predicted Pre % 59  P 81   FVC-Post L 2.07  P 2.72   FVC-Predicted Post % 64  P 81   Pre FEV1/FVC % % 75  P 78   Post FEV1/FCV % % 82  P 78   FEV1-Pre L 1.44  P 2.13   FEV1-Predicted Pre % 57  P 82   FEV1-Post L 1.69  P 2.11   DLCO uncorrected ml/min/mmHg 17.37  P 20.04   DLCO UNC% % 87  P 85   DLCO corrected ml/min/mmHg 17.37  P 20.04   DLCO COR %Predicted % 87  P 85   DLVA Predicted % 99  P 88   TLC L 5.84  P 5.16   TLC % Predicted % 117  P 103   RV % Predicted % 185  P 121     P Preliminary result     Latest Reference Range & Units 08/12/11 03:21 10/26/16 21:59 12/08/16 11:43 01/22/17 12:49 10/09/21 21:25  Eosinophils Absolute 0.0 - 0.5 K/uL 0.0 0.1 83 0.3 0.0     has a past medical history of Anxiety, Arthritis, Depression, Essential hypertension, benign (12/08/2016), GERD (gastroesophageal reflux disease), Heart murmur, High cholesterol (12/08/2016), Hyperlipidemia, Hypertension, PONV (postoperative nausea and vomiting), Sleep apnea, Stroke ([place]), and TIA (transient ischemic attack) (2017).   reports that she quit smoking about 5 months ago. Her smoking use included cigarettes. She started smoking about 40 years ago. She has a 34.00 pack-year smoking history. She has never used smokeless tobacco.  Past Surgical History:  Procedure  Laterality Date   APPENDECTOMY     BIOPSY  12/10/2021   Procedure: BIOPSY;  Surgeon: Harvel Quale, MD;  Location: [facility];  Service: Gastroenterology;;   COLONOSCOPY WITH PROPOFOL N/A 12/10/2021   Procedure: COLONOSCOPY WITH PROPOFOL;  Surgeon: Harvel Quale, MD;  Location: [facility];  Service: Gastroenterology;  Laterality: N/A;  815   JOINT REPLACEMENT     rt knee 11 yrs ago  KNEE ARTHROSCOPY Left    NASAL SINUS SURGERY     POLYPECTOMY  12/10/2021   Procedure: POLYPECTOMY;  Surgeon: Harvel Quale, MD;  Location: [facility];  Service: Gastroenterology;;   TOTAL KNEE ARTHROPLASTY Left 02/01/2020   Procedure: TOTAL KNEE ARTHROPLASTY;  Surgeon: Susa Day, MD;  Location: [facility];  Service: Orthopedics;  Laterality: Left;  general or spinal    Allergies  Allergen Reactions   Bactrim Rash   Sulfa Antibiotics Rash    Immunization History  Administered Date(s) Administered   Influenza Inj Mdck Quad Pf 03/14/2019   Influenza Inj Mdck Quad With Preservative 04/05/2018   Influenza,inj,Quad PF,6+ Mos 04/16/2014, 04/16/2017   Influenza,inj,quad, With Preservative 04/20/2017, 04/05/2018, 03/14/2019, 04/20/2020, 04/10/2021   Influenza-Unspecified 04/25/2015   Moderna Sars-Covid-2 Vaccination 08/15/2019, 09/15/2019, 04/16/2020   PNEUMOCOCCAL CONJUGATE-20 05/16/2021   Pneumococcal Polysaccharide-23 02/24/2019   Tdap 02/16/2012   Zoster Recombinat (Shingrix) 03/08/2018   Zoster, Unspecified 03/08/2018    Family History  Problem Relation Age of Onset   Diabetes Father    Stroke Father    Heart disease Father    Lung cancer Mother      Current Outpatient Medications:    albuterol (PROVENTIL HFA;VENTOLIN HFA) 108 (90 Base) MCG/ACT inhaler, Inhale 1-2 puffs into the lungs 4 (four) times daily as needed for shortness of breath or wheezing., Disp: , Rfl:    albuterol (PROVENTIL) (2.5 MG/3ML) 0.083% nebulizer solution, Take 3 mLs (2.5 mg  total) by nebulization every 6 (six) hours as needed for wheezing or shortness of breath., Disp: 75 mL, Rfl: 12   ALPRAZolam (XANAX) 0.5 MG tablet, Take 0.5 mg by mouth 2 (two) times daily as needed for anxiety. , Disp: , Rfl:    amLODipine (NORVASC) 5 MG tablet, Take 5 mg by mouth daily., Disp: , Rfl:    amoxicillin-clavulanate (AUGMENTIN) 875-125 MG tablet, Take 1 tablet by mouth 2 (two) times daily., Disp: 10 tablet, Rfl: 0   aspirin 325 MG tablet, Take 325 mg by mouth at bedtime., Disp: , Rfl:    atorvastatin (LIPITOR) 80 MG tablet, Take 80 mg by mouth daily., Disp: , Rfl:    Budeson-Glycopyrrol-Formoterol (BREZTRI AEROSPHERE) 160-9-4.8 MCG/ACT AERO, Inhale 2 puffs into the lungs in the morning and at bedtime., Disp: 5.9 g, Rfl: 0   ezetimibe (ZETIA) 10 MG tablet, Take 10 mg by mouth every evening., Disp: , Rfl:    FLUoxetine (PROZAC) 40 MG capsule, Take 80 mg by mouth daily., Disp: , Rfl:    folic acid (FOLVITE) 242 MCG tablet, Take 800 mcg by mouth daily., Disp: , Rfl:    HYDROcodone-acetaminophen (NORCO) 10-325 MG tablet, Take 1 tablet by mouth in the morning and at bedtime., Disp: , Rfl:    irbesartan-hydrochlorothiazide (AVALIDE) 300-12.5 MG per tablet, Take 1 tablet by mouth daily., Disp: , Rfl:    metoprolol tartrate (LOPRESSOR) 25 MG tablet, Take 25 mg by mouth 2 (two) times daily., Disp: , Rfl:    Multiple Vitamins-Minerals (MULTIVITAMIN WITH MINERALS) tablet, Take 1 tablet by mouth daily., Disp: , Rfl:    nicotine (NICODERM CQ - DOSED IN MG/24 HOURS) 21 mg/24hr patch, Place 1 patch (21 mg total) onto the skin daily., Disp: 30 patch, Rfl: 1   pantoprazole (PROTONIX) 40 MG tablet, Take 30- 60 min before your first and last meals of the day (Patient taking differently: Take 40 mg by mouth daily.), Disp: , Rfl:    predniSONE (DELTASONE) 20 MG tablet, Take 2 tablets (40 mg total) by  mouth daily with breakfast., Disp: 10 tablet, Rfl: 0      Objective:   Vitals:   03/06/22 0947  BP:  138/62  Pulse: 77  Temp: 98 F (36.7 C)  TempSrc: Oral  SpO2: 100%  Weight: 215 lb (97.5 kg)  Height: 5' 3.5" (1.613 m)    Estimated body mass index is 37.49 kg/m as calculated from the following:   Height as of this encounter: 5' 3.5" (1.613 m).   Weight as of this encounter: 215 lb (97.5 kg).  '@WEIGHTCHANGE'$ @  [employer]   03/06/22 0947  Weight: 215 lb (97.5 kg)     Physical Exam   General: No distress. Obese. Looks ok Neuro: Alert and Oriented x 3. GCS 15. Speech normal Psych: Pleasant Resp:  Barrel Chest - no.  Wheeze - no, Crackles - no, No overt respiratory distress CVS: Normal heart sounds. Murmurs - no Ext: Stigmata of Connective Tissue Disease - no HEENT: Normal upper airway. PEERL +. No post nasal drip        Assessment:       ICD-10-CM   1. COPD with acute exacerbation ([place])  J44.1     2. COPD, frequent exacerbations ([place])  J44.1     3. Cigarette smoker  F17.210          Plan:     Patient Instructions     ICD-10-CM   1. COPD with acute exacerbation ([place])  J44.1     2. COPD, frequent exacerbations ([place])  J44.1     3. Cigarette smoker  F17.210      Now with another flare up that is improving with current flare up - midway through outpatient treatment   You are prone to flareups because of previous cigarette smoking, also exposure to the chicken coop  Lung functipn has declined to 57% from 82% in 2018  Plan  -Change Stiolto to BREZTRI 2 puff twice daily - Take new tablet called roflumilast 1 tablet   - starter pack 245mg per day after food x 30 days or 1 month  - then escalate to 5039m daily after food x continue at this dose  - any side effects especially GI call [place] quit Smoking 100% -Avoid chicken coop exposure -get cxr 2 view 03/06/2022 - finish current antibipotics and prednisone for flare up  Follow-up - Return to see Dr. RaChase Callern 3 months  - consider ARNASA study in future    SIGNATURE    Dr. MuBrand MalesM.D., F.C.C.P,  Pulmonary and Critical Care Medicine Staff Physician, [facility]irector - Interstitial Lung Disease  Program  Pulmonary [id number]: [phone] no answer or between  15:00h - 7:00h: call [phone] Telephone: [phone]  10:23 AM 03/06/2022

## 2022-03-06 NOTE — Progress Notes (Signed)
Full PFT performed today. °

## 2022-03-24 ENCOUNTER — Other Ambulatory Visit (HOSPITAL_COMMUNITY)
Admission: RE | Admit: 2022-03-24 | Discharge: 2022-03-24 | Disposition: A | Discharge status: Home / Self Care | Payer: Medicare HMO | Source: Ambulatory Visit | Attending: Internal Medicine | Admitting: Internal Medicine

## 2022-03-24 ENCOUNTER — Telehealth: Payer: Self-pay | Admitting: Internal Medicine

## 2022-03-24 DIAGNOSIS — J441 Chronic obstructive pulmonary disease with (acute) exacerbation: Secondary | ICD-10-CM | POA: Diagnosis present

## 2022-03-24 LAB — CBC WITH DIFFERENTIAL/PLATELET
Abs Immature Granulocytes: 0.04 10*3/uL (ref 0.00–0.07)
Basophils Absolute: 0 10*3/uL (ref 0.0–0.1)
Basophils Relative: 0 %
Eosinophils Absolute: 0.2 10*3/uL (ref 0.0–0.5)
Eosinophils Relative: 2 %
HCT: 38.1 % (ref 36.0–46.0)
Hemoglobin: 12.5 g/dL (ref 12.0–15.0)
Immature Granulocytes: 0 %
Lymphocytes Relative: 19 %
Lymphs Abs: 1.8 10*3/uL (ref 0.7–4.0)
MCH: 29.7 pg (ref 26.0–34.0)
MCHC: 32.8 g/dL (ref 30.0–36.0)
MCV: 90.5 fL (ref 80.0–100.0)
Monocytes Absolute: 0.9 10*3/uL (ref 0.1–1.0)
Monocytes Relative: 10 %
Neutro Abs: 6.5 10*3/uL (ref 1.7–7.7)
Neutrophils Relative %: 69 %
Platelets: 218 10*3/uL (ref 150–400)
RBC: 4.21 MIL/uL (ref 3.87–5.11)
RDW: 12.9 % (ref 11.5–15.5)
WBC: 9.4 10*3/uL (ref 4.0–10.5)
nRBC: 0 % (ref 0.0–0.2)

## 2022-03-24 MED ORDER — DOXYCYCLINE HYCLATE 100 MG PO TABS: 100.0000 mg | ORAL_TABLET | Freq: Two times a day (BID) | ORAL | 0 refills | Status: DC

## 2022-03-24 MED ORDER — PREDNISONE 10 MG PO TABS: ORAL_TABLET | ORAL | 0 refills | Status: AC

## 2022-03-24 NOTE — Telephone Encounter (Signed)
  Ok when was her last prednisone > has it been 7 days atleast? And how is she tolerated the daliresp?  I like her to do cbc with diff, IgE and RAST allergy profile first before starring the next round of prednisone/abx  Plan - do IgE, cbc with diff and RAST allergy profile  =- Take doxycycline '100mg'$  po twice daily x 5 days; take after meals and avoid sunlight - Take prednisone 40 mg daily x 2 days, then '20mg'$  daily x 2 days, then '10mg'$  daily x 2 days, then '5mg'$  daily x 2 days and stop   Allergies  Allergen Reactions   Bactrim Rash   Sulfa Antibiotics Rash        Latest Reference Range & Units 08/12/11 03:21 10/26/16 21:59 12/08/16 11:43 01/22/17 12:49 10/09/21 21:25  Eosinophils Absolute 0.0 - 0.5 K/uL 0.0 0.1 83 0.3 0.0

## 2022-03-24 NOTE — Telephone Encounter (Signed)
She reports she has been coughing up some yellow sputum since Thursday 03/20/2022. She has tested for Covid yesterday morning and it was negative. She has been taking Mucinex 2 times a day, she has been using her albuterol nebulizer 3 times a day and using her Breztri inhaler. She reports she feels worse in the evening and then will feel better during the day. Please advise?

## 2022-03-24 NOTE — Telephone Encounter (Signed)
Called and spoke with pt to see when her last dose of prednisone was and she said her last dose was 9/28. Pt said so far she is tolerating the Daliresp 2105mg fine. Pt will begin 5039m once finished with the 25073m   Stated to pt that we were going to send Rx for prednisone and doxy to the pharmacy for her but let her know that MR wanted her to have bloodwork done prior to starting meds and [facility] and she verbalized understanding. All orders have been placed and meds have been sent to pharmacy. Nothing further needed.

## 2022-03-26 ENCOUNTER — Telehealth: Payer: Self-pay | Admitting: Internal Medicine

## 2022-03-26 DIAGNOSIS — J441 Chronic obstructive pulmonary disease with (acute) exacerbation: Secondary | ICD-10-CM

## 2022-03-26 MED ORDER — BREZTRI AEROSPHERE 160-9-4.8 MCG/ACT IN AERO: 2.0000 | INHALATION_SPRAY | Freq: Two times a day (BID) | RESPIRATORY_TRACT | 0 refills | Status: DC

## 2022-03-26 NOTE — Telephone Encounter (Signed)
Patient called to ask the nurse to call regarding her test results.  Also she would like a script for her Breztri inhaler sent into the pharmacy.  Please advise.

## 2022-03-26 NOTE — Telephone Encounter (Signed)
Called and spoke with patient. Patient stated that she wanted to know what her lab results were. Patient also stated she needed a refill on her inhaler. Patient verified pharmacy. Rx for inhaler has been sent to her pharmacy.   MR, please advise on lab results.

## 2022-03-27 LAB — IGE: IgE (Immunoglobulin E), Serum: 188 IU/mL (ref 6–495)

## 2022-03-27 MED ORDER — BREZTRI AEROSPHERE 160-9-4.8 MCG/ACT IN AERO: 2.0000 | INHALATION_SPRAY | Freq: Two times a day (BID) | RESPIRATORY_TRACT | 6 refills | Status: DC

## 2022-03-27 NOTE — Telephone Encounter (Signed)
Called and spoke with pt letting her know the results of bloodwork and she verbalized understanding. RAST panel was not ordered so test has been ordered and pt has been made aware to go to [facility] for labwork. Nothing further needed.

## 2022-03-27 NOTE — Telephone Encounter (Signed)
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 If so , I do not see results

## 2022-04-21 NOTE — Progress Notes (Unsigned)
Cardiology Office Note:    Date:  04/22/2022   ID:  Jenna Hancock, DOB [birth year], MRN [id number]  PCP:  Clinton Quant, MD   [facility] Cardiologist:  Freada Bergeron, MD {     Referring MD: Clinton Quant, MD    History of Present Illness:    Jenna Hancock is a 61 y.o. female with a hx of hypertension, hyperlipidemia, OSA not on CPAP, TIA, tobacco use, and coronary calcification who presents to clinic for follow-up.  Patient initially seen by Dr. Gardiner Rhyme for evaluation of coronary calcification on CT chest. She was not having anginal symptoms at that time and was recommended for continued medical management.  Today, the patient states that she overall feels okay. She has noticed intermittent heart flutters that have been ongoing for the past couple of months. Symptoms last about 5-10 minutes before resolving. No associated chest pain, SOB, LE edema, orthopnea or PND, or lightheadedness. Has had 4 total episodes in the past several months. Notably, she has been under a lot of stress which she thinks has been the primary driver of symptoms. She has declined a cardiac monitor but wants to obtain a Kardia device to monitor further.   Otherwise, has some positional lightheadedness but no syncope. Remains active with no anginal symptoms. BP is well controlled at home 120/70s. States she was rushing here today. Compliant with medications and tolerating them well.   Past Medical History:  Diagnosis Date   Anxiety    Arthritis    Depression    Essential hypertension, benign 12/08/2016   GERD (gastroesophageal reflux disease)    Heart murmur    AS A CHILD    High cholesterol 12/08/2016   Hyperlipidemia    Hypertension    PONV (postoperative nausea and vomiting)    Sleep apnea    DOES NOT USE CPAP    Stroke ([place])    HX OF MINI STROKE 2016    TIA (transient ischemic attack) 2017    Past Surgical History:  Procedure Laterality Date    APPENDECTOMY     BIOPSY  12/10/2021   Procedure: BIOPSY;  Surgeon: Harvel Quale, MD;  Location: [facility];  Service: Gastroenterology;;   COLONOSCOPY WITH PROPOFOL N/A 12/10/2021   Procedure: COLONOSCOPY WITH PROPOFOL;  Surgeon: Harvel Quale, MD;  Location: [facility];  Service: Gastroenterology;  Laterality: N/A;  815   JOINT REPLACEMENT     rt knee 11 yrs ago   KNEE ARTHROSCOPY Left    NASAL SINUS SURGERY     POLYPECTOMY  12/10/2021   Procedure: POLYPECTOMY;  Surgeon: Harvel Quale, MD;  Location: [facility];  Service: Gastroenterology;;   TOTAL KNEE ARTHROPLASTY Left 02/01/2020   Procedure: TOTAL KNEE ARTHROPLASTY;  Surgeon: Susa Day, MD;  Location: [facility];  Service: Orthopedics;  Laterality: Left;  general or spinal    Current Medications: Current Meds  Medication Sig   albuterol (PROVENTIL) (2.5 MG/3ML) 0.083% nebulizer solution Take 3 mLs (2.5 mg total) by nebulization every 6 (six) hours as needed for wheezing or shortness of breath.   ALPRAZolam (XANAX) 0.5 MG tablet Take 0.5 mg by mouth 2 (two) times daily as needed for anxiety.    amLODipine (NORVASC) 5 MG tablet Take 5 mg by mouth daily.   aspirin 325 MG tablet Take 325 mg by mouth at bedtime.   atorvastatin (LIPITOR) 80 MG tablet Take 80 mg by mouth daily.   Budeson-Glycopyrrol-Formoterol (BREZTRI AEROSPHERE)  160-9-4.8 MCG/ACT AERO Inhale 2 puffs into the lungs in the morning and at bedtime.   ezetimibe (ZETIA) 10 MG tablet Take 10 mg by mouth every evening.   FLUoxetine (PROZAC) 40 MG capsule Take 80 mg by mouth daily.   folic acid (FOLVITE) 324 MCG tablet Take 800 mcg by mouth daily.   HYDROcodone-acetaminophen (NORCO) 10-325 MG tablet Take 1 tablet by mouth in the morning and at bedtime.   irbesartan-hydrochlorothiazide (AVALIDE) 300-12.5 MG per tablet Take 1 tablet by mouth daily.   metoprolol tartrate (LOPRESSOR) 25 MG tablet Take 25 mg by mouth 2 (two) times daily.    Multiple Vitamins-Minerals (MULTIVITAMIN WITH MINERALS) tablet Take 1 tablet by mouth daily.   pantoprazole (PROTONIX) 40 MG tablet Take 30- 60 min before your first and last meals of the day (Patient taking differently: Take 40 mg by mouth daily.)   Roflumilast (DALIRESP) 250 MCG TABS Take 1 tablet by mouth daily.     Allergies:   Bactrim and Sulfa antibiotics   Social History   Socioeconomic History   Marital status: Divorced    Spouse name: Not on file   Number of children: 2   Years of education: 12   Highest education level: Not on file  Occupational History   Occupation: disabled  Tobacco Use   Smoking status: Former    Packs/day: 1.00    Years: 34.00    Total pack years: 34.00    Types: Cigarettes    Start date: 21    Quit date: 10/04/2021    Years since quitting: 0.5   Smokeless tobacco: Never   Tobacco comments:    Former smoker   Scientific laboratory technician Use: Never used  Substance and Sexual Activity   Alcohol use: No    Alcohol/week: 0.0 standard drinks of alcohol    Comment: (maybe one drink 3x a year)   Drug use: No   Sexual activity: Not on file  Other Topics Concern   Not on file  Social History Narrative   Not on file   Social Determinants of Health   Financial Resource Strain: Not on file  Food Insecurity: Not on file  Transportation Needs: Not on file  Physical Activity: Not on file  Stress: Not on file  Social Connections: Not on file     Family History: The patient's family history includes Diabetes in her father; Heart disease in her father; Lung cancer in her mother; Stroke in her father.  ROS:   Please see the history of present illness.     All other systems reviewed and are negative.  EKGs/Labs/Other Studies Reviewed:    The following studies were reviewed today: TTE Apr 23, 2021: IMPRESSIONS     1. Left ventricular ejection fraction, by estimation, is 70 to 75%. The  left ventricle has hyperdynamic function. The left ventricle has no   regional wall motion abnormalities. There is mild left ventricular  hypertrophy. Left ventricular diastolic  parameters are indeterminate.   2. Right ventricular systolic function is normal. The right ventricular  size is normal. Tricuspid regurgitation signal is inadequate for assessing  PA pressure.   3. The mitral valve is normal in structure. No evidence of mitral valve  regurgitation. No evidence of mitral stenosis.   4. The aortic valve is tricuspid. Aortic valve regurgitation is not  visualized. No aortic stenosis is present.   5. The inferior vena cava is normal in size with greater than 50%  respiratory variability, suggesting right atrial pressure  of 3 mmHg.   CT chest 11/2021: IMPRESSION: 1. Previously seen widespread, somewhat geographic airspace opacity throughout the lungs is almost completely resolved, with only scattered areas of residual ground-glass, for example in the left apex and in the superior segment left lower lobe. Findings are consistent with resolution of infection or inflammation. 2. No residual evidence of fibrotic interstitial lung disease. 3. Mild lobular air trapping on expiratory phase imaging, suggestive of small airways disease. 4. Coronary artery disease. 5. Cholelithiasis.     EKG:  EKG is  ordered today.  The ekg ordered today demonstrates NSR with HR 76  Recent Labs: 10/11/2021: ALT 74 12/06/2021: BUN 20; Creatinine, Ser 0.86; Potassium 4.2; Sodium 139 03/24/2022: Hemoglobin 12.5; Platelets 218  Recent Lipid Panel    Component Value Date/Time   CHOL 96 03/27/2021 0955   TRIG 65 03/27/2021 0955   HDL 35 (L) 03/27/2021 0955   CHOLHDL 2.7 03/27/2021 0955   VLDL 13 03/27/2021 0955   LDLCALC 48 03/27/2021 0955     Risk Assessment/Calculations:                Physical Exam:    VS:  BP 123/72   Pulse 73   Ht '5\' 3"'$  (1.6 m)   Wt 212 lb (96.2 kg)   SpO2 95%   BMI 37.55 kg/m     Wt Readings from Last 3 Encounters:  04/22/22  212 lb (96.2 kg)  03/06/22 215 lb (97.5 kg)  12/06/21 220 lb (99.8 kg)     GEN:  Well nourished, well developed in no acute distress HEENT: Normal NECK: No JVD; No carotid bruits CARDIAC: RRR, 2/6 systolic murmur RESPIRATORY:  Clear to auscultation without rales, wheezing or rhonchi  ABDOMEN: Soft, non-tender, non-distended MUSCULOSKELETAL:  No edema; No deformity  SKIN: Warm and dry NEUROLOGIC:  Alert and oriented x 3 PSYCHIATRIC:  Normal affect   ASSESSMENT:    1. Palpitations   2. Essential hypertension, benign   3. Coronary artery disease involving native coronary artery of native heart without angina pectoris   4. Tobacco use   5. Hyperlipidemia, unspecified hyperlipidemia type   6. Essential hypertension    PLAN:    In order of problems listed above:  #Palpitations: Developed in the setting of worsening stress. Has had 4 total episodes over the past several months, each lasting about 5-27mn. Symptoms are not exertional. TTE with LVEF 70-75% with no significant valve disease. Discussed cardiac monitor but she is worried her symptoms are too infrequent. She is planning to purchase a Kardia device for further evaluation. Will see back in 3 months to review. -Declined cardiac monitor as she is concerned it will not capture the event due to infrequent episodes -She will obtain a Kardia device for further monitoring -Will see back in 3 month to review -Discussed conservative measures like increasing hydration, cutting back on caffeine, avoiding tobacco   #CAD: Mild coronary Ca on CT chest. No exertional symptoms. -Continue lipitor '80mg'$  daily -Continue zetia '10mg'$  daily  #HTN: Blood pressure better controlled at home mainly 120/70s. -Conitnue amlodipine '10mg'$  daily -Continue metop '25mg'$  BID -Continue irbesartan-HCTZ 300-12.'5mg'$  daily  #HLD: LDL controlled at 48 in 03/2021. Monitored by her PCP. -Continue lipitor '80mg'$  daily -Continue zetia '10mg'$  daily -LDL controlled  03/2021 at 48 (follows with PCP but just had labs last week)  #History of TIA: -Continue lipitor '80mg'$  daily -Continue zetia '10mg'$  daily  #Systolic Murmur: Benign. TTE without significant valve disease.  Medication Adjustments/Labs and Tests Ordered: Current medicines are reviewed at length with the patient today.  Concerns regarding medicines are outlined above.  Orders Placed This Encounter  Procedures   EKG 12-Lead   No orders of the defined types were placed in this encounter.   Patient Instructions  Medication Instructions:  Your physician recommends that you continue on your current medications as directed. Please refer to the Current Medication list given to you today.  *If you need a refill on your cardiac medications before your next appointment, please call your pharmacy*   Lab Work: NONE   If you have labs (blood work) drawn today and your tests are completely normal, you will receive your results only by: [facility] (if you have MyChart) OR A paper copy in the mail If you have any lab test that is abnormal or we need to change your treatment, we will call you to review the results.   Testing/Procedures: NONE    Follow-Up: At [facility], you and your health needs are our priority.  As part of our continuing mission to provide you with exceptional heart care, we have created designated Provider Care Teams.  These Care Teams include your primary Cardiologist (physician) and Advanced Practice Providers (APPs -  Physician Assistants and Nurse Practitioners) who all work together to provide you with the care you need, when you need it.  We recommend signing up for the patient portal called "MyChart".  Sign up information is provided on this After Visit Summary.  MyChart is used to connect with patients for Virtual Visits (Telemedicine).  Patients are able to view lab/test results, encounter notes, upcoming appointments, etc.  Non-urgent  messages can be sent to your provider as well.   To learn more about what you can do with MyChart, go to [email or web address].    Your next appointment:   3 month(s)  The format for your next appointment:   In Person  Provider:   You may see Freada Bergeron, MD or one of the following Advanced Practice Providers on your designated Care Team:   Bernerd Pho, PA-C  Ermalinda Barrios, PA-C     Other Instructions Thank you for choosing [facility]!    Important Information About Sugar         Signed, Freada Bergeron, MD  04/22/2022 4:36 PM    [place]

## 2022-04-22 ENCOUNTER — Ambulatory Visit: Payer: Medicare HMO | Attending: Cardiology | Admitting: Cardiology

## 2022-04-22 ENCOUNTER — Encounter: Payer: Self-pay | Admitting: Cardiology

## 2022-04-22 VITALS — BP 123/72 | HR 73 | Ht 63.0 in | Wt 212.0 lb

## 2022-04-22 DIAGNOSIS — I10 Essential (primary) hypertension: Secondary | ICD-10-CM

## 2022-04-22 DIAGNOSIS — Z720 Tobacco use: Secondary | ICD-10-CM | POA: Diagnosis not present

## 2022-04-22 DIAGNOSIS — R002 Palpitations: Secondary | ICD-10-CM | POA: Diagnosis not present

## 2022-04-22 DIAGNOSIS — I2510 Atherosclerotic heart disease of native coronary artery without angina pectoris: Secondary | ICD-10-CM | POA: Diagnosis not present

## 2022-04-22 DIAGNOSIS — E785 Hyperlipidemia, unspecified: Secondary | ICD-10-CM

## 2022-04-22 NOTE — Patient Instructions (Signed)
Medication Instructions:  Your physician recommends that you continue on your current medications as directed. Please refer to the Current Medication list given to you today.  *If you need a refill on your cardiac medications before your next appointment, please call your pharmacy*   Lab Work: NONE   If you have labs (blood work) drawn today and your tests are completely normal, you will receive your results only by: [facility] (if you have MyChart) OR A paper copy in the mail If you have any lab test that is abnormal or we need to change your treatment, we will call you to review the results.   Testing/Procedures: NONE    Follow-Up: At [facility], you and your health needs are our priority.  As part of our continuing mission to provide you with exceptional heart care, we have created designated Provider Care Teams.  These Care Teams include your primary Cardiologist (physician) and Advanced Practice Providers (APPs -  Physician Assistants and Nurse Practitioners) who all work together to provide you with the care you need, when you need it.  We recommend signing up for the patient portal called "MyChart".  Sign up information is provided on this After Visit Summary.  MyChart is used to connect with patients for Virtual Visits (Telemedicine).  Patients are able to view lab/test results, encounter notes, upcoming appointments, etc.  Non-urgent messages can be sent to your provider as well.   To learn more about what you can do with MyChart, go to [email or web address].    Your next appointment:   3 month(s)  The format for your next appointment:   In Person  Provider:   You may see Freada Bergeron, MD or one of the following Advanced Practice Providers on your designated Care Team:   Bernerd Pho, PA-C  Ermalinda Barrios, PA-C     Other Instructions Thank you for choosing [facility]!    Important Information About Sugar

## 2022-04-23 ENCOUNTER — Telehealth: Payer: Self-pay | Admitting: Internal Medicine

## 2022-04-23 DIAGNOSIS — J329 Chronic sinusitis, unspecified: Secondary | ICD-10-CM

## 2022-04-23 DIAGNOSIS — Z889 Allergy status to unspecified drugs, medicaments and biological substances status: Secondary | ICD-10-CM

## 2022-04-23 NOTE — Telephone Encounter (Signed)
Pt called stating that she saw PCP Thursday 11/2 and had bloodwork done. Stated they told her that her WBC was elevated. Pt said that she has been feeling fine but states that she has has a cough and a runny nose. States occasionally she will cough up yellow phlegm.  Due to her WBC being elevated, pt wants to know if he might have any recommendations if any meds needed to be prescribed.  MR, please advise.

## 2022-04-23 NOTE — Telephone Encounter (Signed)
Just unable to get a break trhough with her recurrent exacerbations  Plan  - if she has not done skin allergy tests - rec allegy referrla (blood was fine) - get CT scan sinus wo contrast - recs sinisutis

## 2022-04-23 NOTE — Telephone Encounter (Signed)
Called and spoke to patient about recommendations from Dr Chase Caller. Patient voiced understanding. And agreed to referral and CT scan. Nothing further needed

## 2022-04-25 ENCOUNTER — Telehealth: Payer: Self-pay

## 2022-04-25 NOTE — Telephone Encounter (Signed)
Sir I received an Librarian, academic from [place] [facility] and she has the CT Maxillofacial without contrast scan that you ordered scheduled for 05/16/2022. And she is just needing you to sign off the order for the scan.   Please when you return to clinic if you can sign this order that would be great.   Thank you sir

## 2022-04-28 NOTE — Telephone Encounter (Signed)
This has now been signed

## 2022-05-16 ENCOUNTER — Ambulatory Visit (HOSPITAL_COMMUNITY): Payer: Medicare HMO

## 2022-06-02 ENCOUNTER — Ambulatory Visit (HOSPITAL_COMMUNITY): Payer: Medicare HMO

## 2022-06-05 ENCOUNTER — Encounter: Payer: Self-pay | Admitting: Internal Medicine

## 2022-06-05 ENCOUNTER — Ambulatory Visit: Payer: Medicare HMO | Admitting: Internal Medicine

## 2022-06-05 VITALS — BP 110/60 | HR 66 | Temp 98.0°F | Ht 64.0 in | Wt 203.0 lb

## 2022-06-05 DIAGNOSIS — J209 Acute bronchitis, unspecified: Secondary | ICD-10-CM

## 2022-06-05 DIAGNOSIS — R053 Chronic cough: Secondary | ICD-10-CM

## 2022-06-05 DIAGNOSIS — J441 Chronic obstructive pulmonary disease with (acute) exacerbation: Secondary | ICD-10-CM

## 2022-06-05 DIAGNOSIS — J329 Chronic sinusitis, unspecified: Secondary | ICD-10-CM | POA: Diagnosis not present

## 2022-06-05 DIAGNOSIS — Z87891 Personal history of nicotine dependence: Secondary | ICD-10-CM | POA: Diagnosis not present

## 2022-06-05 MED ORDER — BENZONATATE 200 MG PO CAPS: 200.0000 mg | ORAL_CAPSULE | Freq: Three times a day (TID) | ORAL | 1 refills | Status: DC | PRN

## 2022-06-05 NOTE — Progress Notes (Signed)
Subjective:     Patient ID: Jenna Hancock, female   DOB: September 01, 1960, 61 y.o.   MRN: [id number]  HPI PCP Pomposini, Cherly Anderson, MD   HPI   IOV 03/19/2017  Chief Complaint  Patient presents with   Advice Only    Abnormal cxr 01/22/17 and due to that, pt requested to come to our office for appt. C/o SOB on exertion with occ. dry cough and occ. chest tightness. Pt stated that in May and June she was cleaning her place out due to having rat infestation and became real sick then and hasn't felt the same since.   Jenna Hancock December 26, 1960 : 61 year old female whose mother Jenna Hancock that I take care of for COPD. Patient herself is a smoker greater than 30 pack per day. But at baseline she has never had any respiratory issues. [place] with her boyfriend who is here with her today Then approximately the first week of May 2018: She took it upon herself to enter and closed small room that are not being visited in 1 year. The room contained chicken feed. There was significant rat infestation. She used a leaf blower without a mass and for 3 hours guarded of the rats. During this time a lot of rat feces she inhale and was exposed to. One day after this she started having fever and wheezing and shortness of breath and feeling extremely rundown. Then on 10/26/2016 She went to the ER in May 2018 with fever and generalized body aches following tick bite 2. She did not tell the emergency room doctors about exposure to rat feces because of embarrassment.  During this time wheezing was noticed on the physical exam. She was discharged on doxycycline. It looks like she did not get prednisone. After this she reports she's been to other urgent care but did not get prednisone and steroids. Then in June 2018 she saw primary care for chronic diarrhea At this visit she reported she lost 22 pounds. C. difficile test was negative. Then in 02/01/2017 she went to the emergency department because of  shortness of breath after mowing grass on a hot humid day. In the emergency room she did not have wheezing. She was discharged after albuterol and dexamethasone taper. She tells me that till this day that she is much better but she still has wheezing and shortness of breath and feeling fatigued and is not fully better. She is extremely worried about her respiratory issues    Chest x-ray 02/01/2017: Reported a COPD without any abnormality but to me on my personal visualization is only mildly hyperinflated but otherwise clear  Blood lab work 01/22/2017: Troponin normal, BMP normal, liver function test normal and creatinine normal and white count normal  Walking desaturation test 185 feet 3 laps on room air with a full head probe: Resting heart rate 57/m. Peak heart rate 85/m. Resting pulse ox 100%. Final pulse ox 99% and she became short of breath. She did not desaturate.   Exhaled nitric oxide in the office 03/19/2017: 7 ppb  Spirometry 03/19/2017 -> fev1 1/9L/70%, Ratio 76 - office spirometry  Past medical history review shows that she's had a history of pneumonia, sinus infections and sinus nasal surgeries.      06/11/2017 acute extended ov/[place] re:  Chief Complaint  Patient presents with   Acute Visit    Pt c/o increased SOB, wheezing, chest tightness and prod cough with minimal green sputum- onset  was 6 days ago. She had fever 1 day ago.   cough off and on since May 2018 while on breo and using lots of saba / assoc chest tight better p saba  Shoemaker eval 2 week prior to [place]  Did not rec abx  Acute worse cough with purulent sputum esp in am since 06/05/17 assoc with nasal congestion/ sore throat/    sob with watery nasal d/c  rx pred / omnicef 12/21/- 12/26 and no better    No obvious day to day or daytime variability or assoc    mucus plugs or hemoptysis or cp or  or overt   hb symptoms. No unusual exposure hx or h/o childhood pna/ asthma or knowledge of premature  birth.  Sleeping ok flat without nocturnal    exacerbation  of respiratory  c/o's or need for noct saba. Also denies any obvious fluctuation of symptoms with weather or environmental changes or other aggravating or alleviating factors except as outlined above    OV 06/25/2017  Chief Complaint  Patient presents with   Follow-up    Pt was seen by [facility] 06/11/17 and was dx with bronchitis. Pt went to ENT, Dr. Wilburn Cornelia and is currently on an abx. Pt has complaints of a cough with yellow mucus. Denies any SOB or CP   Jenna Hancock returns for follow-up.  She has respiratory symptoms following rat  antigen exposure in May 2018.  She continues to smoke and she continues to have respiratory symptoms.  Around Christmas 2018 she started feeling sick.  June 11, 2017 she saw my colleague Dr. Christinia Gully who advised her to stop smoking given antibiotic and prednisone.  Did a chest x-ray that I personally visualized that shows bronchitic changes but also my opinion that might be interstitial lung disease changes.  She started feeling better with and followed up with ENT doctor Dr. Wilburn Cornelia who has her on prolonged antibiotic therapy right now.  Overall she is better but she feels stable.  Nevertheless she feels she is a new new low baseline.  She feels fatigued all the time.  She is frustrated with  symptomatology but she continues to smoke.   PFT 04/22/17 - normal FENO 12 ppb 06/25/2017   OV 11/23/2017  Chief Complaint  Patient presents with   Follow-up    Pt states she has been doing well since last visit and denies any complaints or concerns.    Jenna Hancock , 61 y.o. , with dob Aug 31, 1960 and female ,Not Hispanic or Latino from [address] [place] [address] - presents to pulm clinic for cough and resp symptoms due to smoking and chicken coop expisure with rapid antigen.  At the end of last visit in January 2019 we did a high-resolution CT scan of the chest and this shows RB ILD findings.   Autoimmune and hypersensitivity pneumonitis panel was negative.  Currently she says she has cut down on smoking and with this her respiratory symptoms have improved.  In addition she also has cut down exposure to the chicken coop.  In fact she has eliminated this.  She still has some symptoms that are described below on the CAT score even though she does not have COPD.  The symptoms are relatively new compared to the pre-exposure days but they are improving.  Albuterol does help this.  We discussed quitting smoking because she still continues to smoke.  Several years ago she quit with the help of Chantix for 1-1/2 years but  then relapsed.  She says Chantix because her $200 a month while she is spending only 25 or $30 a month for smoking.  She is try to get a lower price on the Chantix.  Apparently a friend of hers is trying to help her with the discount program.  We did give her a discount card.       OV 08/17/2018  Subjective:  Patient ID: Jenna Hancock, female , DOB: 25-Apr-1961 , age 82 y.o. , MRN: [id number] , ADDRESS: [place] [id number]   08/17/2018 -   Chief Complaint  Patient presents with   Follow-up    Pt states she has been doing well since last visit and denies any concerns.  FU smoking FU RB-ILD  HPI Jenna Hancock 61 y.o. -returns for follow-up.  Last seen June 2019.  With the help of Chantix she quit smoking but the remission last 3 months and approximately 2 months ago she started smoking a cigarette here and there when she tapered her Chantix down to once a day.  Now for the last 2 weeks she is gone back up on the Chantix to twice daily and smoking has been in remission.  Nevertheless these efforts have yielded significant improvement in symptoms and she feels good.  She is trying to avoid the chicken coop.  She reports no new problems.  Chart review shows that she had coronary artery calcification on the previous CT scan of the chest but she denies any chest pain or  shortness of breath or wheezing or cough.    CAT COPD Symptom & Quality of Life Score (GSK trademark) 0 is no burden. 5 is highest burden 11/23/2017   Never Cough -> Cough all the time 3  No phlegm in chest -> Chest is full of phlegm 1  No chest tightness -> Chest feels very tight 2  No dyspnea for 1 flight stairs/hill -> Very dyspneic for 1 flight of stairs 3  No limitations for ADL at home -> Very limited with ADL at home 0  Confident leaving home -> Not at all confident leaving home -0  Sleep soundly -> Do not sleep soundly because of lung condition 2  Lots of Energy -> No energy at all 2  TOTAL Score (max 40)  13    CT chest IMPRESSION: 1. Minimal patchy ground-glass centrilobular micronodularity in the upper lobes. 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 2. Mild to moderate patchy air trapping in both lungs, indicative of small airways disease. 3. One vessel coronary atherosclerosis. 4. Cholelithiasis.   Aortic Atherosclerosis (ICD10-I70.0).     Electronically Signed   By: Ilona Sorrel M.D.   On: 07/07/2017 17:08  ROS - per HPI   OV 02/15/2021  Subjective:  Patient ID: Jenna Hancock, female , DOB: Oct 30, 1960 , age 58 y.o. , MRN: [id number] , ADDRESS: [place] [id number] PCP Dannial Monarch, FNP Patient Care Team: Dannial Monarch, FNP as PCP - General (Nurse Practitioner)  This Provider for this visit: Treatment Team:  Attending Provider: Brand Males, MD    02/15/2021 -   Chief Complaint  Patient presents with   Follow-up    Pt states she has been doing okay since last visit and denies any complaints.     HPI Jenna Hancock 61 y.o. -returns for follow-up of her smoking and RB ILD upper lobe groundglass nodules.  Last CT scan was in  2019.  She had a CT scan of the chest in 2022.  Radiology did not compare but according to the report it seems similar without any  change.  She is denying any chest pain or shortness of breath or coughing or wheezing.  She is relapsed into smoking.  She wants another Chantix prescription if it still available on the market.  I will make this.  She denies any suicidal or homicidal ideations.  Denies any nightmares prior with Chantix.  No firearms in the house.  Of note her mother had COVID.  Be treated with antiviral.  Now she saying the mother has green nasal drainage for the last day.  She is wondering about antibiotics because the mother is also coughing more from the nasal drainage.  I made a separate phone note about that for Cindee Salt.    CT Chest data 7/322  Narrative & Impression  CLINICAL DATA:  Pneumonia.   EXAM: CT CHEST WITHOUT CONTRAST   TECHNIQUE: Multidetector CT imaging of the chest was performed following the standard protocol without IV contrast.   COMPARISON:  Radiograph same day   FINDINGS: Cardiovascular: No significant vascular findings. Normal heart size. No pericardial effusion. Scattered calcifications of the thoracic aorta.   Mediastinum/Nodes: Paratracheal lymph nodes are prominent but not pathologically enlarged 6 7 mm. Supraclavicular nodes.   Lungs/Pleura: There is bilateral patchy ground-glass opacities within the upper and lower lobes. No discrete nodularity. No pleural fluid. Airways normal.   Upper Abdomen: Limited view of the liver, kidneys, pancreas are unremarkable. Normal adrenal glands.   Musculoskeletal: No aggressive osseous lesion.   IMPRESSION: 1. Bilateral patchy ground-glass opacities without consolidation or nodularity. Nonspecific finding with differential including pulmonary edema, multifocal atypical pneumonia including viral pneumonia or pneumonitis. 2. Small paratracheal lymph nodes are presumably reactive.     Electronically Signed   By: Suzy Bouchard M.D.   On: 12/16/2020 13:52      OV 12/03/2021  Subjective:  Patient ID: Jenna Hancock, female , DOB: 1960-11-28 , age 8 y.o. , MRN: [id number] , ADDRESS: [place] [id number] PCP Pomposini, Cherly Anderson, MD Patient Care Team: Pomposini, Cherly Anderson, MD as PCP - General (Internal Medicine) Donato Heinz, MD as PCP - Cardiology (Cardiology)  This Provider for this visit: Treatment Team:  Attending Provider: Brand Males, MD    12/03/2021 -   Chief Complaint  Patient presents with   Follow-up    Pt states she is doing much better compared to last visit.    I Jenna Hancock 61 y.o. -returns for follow-up.  Since her last saw her she saw my colleague Dr. Prudence Davidson and was admitted for rhinovirus related COPD exacerbation multifocal viral pneumonia.  According to husband she lets her sinus infections linger too long and not seek early treatment.  I am meeting him for the first time.  He wanted to expressed the sentiment specifically.  She does admit to this.  In addition she does get exposed to the chicken coop.  She says she is cut down on the exposures and wears a mask but still she does it once in every 3 months.  However she has quit smoking 1 week before the hospitalization and the smoking is still in remission.  Review of the medication shows she is on Stiolto but also taking omega-3 fatty oil.  She denies any active acid reflux.  I did explain to her omega-3 fatty oil can cause acid reflux.  This  is a risk factor for flareups of COPD or lung disease.  We discussed about sinus hygiene measures.  She had a high-resolution CT chest that shows near resolution of her pulmonary infiltrates.  No obvious emphysema described    CT Chest data - HRCT 11/27/21  IMPRESSION: 1. Previously seen widespread, somewhat geographic airspace opacity throughout the lungs is almost completely resolved, with only scattered areas of residual ground-glass, for example in the left apex and in the superior segment left lower lobe. Findings are consistent with resolution of  infection or inflammation. 2. No residual evidence of fibrotic interstitial lung disease. 3. Mild lobular air trapping on expiratory phase imaging, suggestive of small airways disease. 4. Coronary artery disease. 5. Cholelithiasis.   Aortic Atherosclerosis (ICD10-I70.0).     Electronically Signed   By: Delanna Ahmadi M.D.   On: 11/29/2021 12:47  No results found.    PFT  OV 03/06/2022  Subjective:  Patient ID: Jenna Hancock, female , DOB: 1961/04/06 , age 89 y.o. , MRN: [id number] , ADDRESS: [place] [phone] PCP Pomposini, Cherly Anderson, MD Patient Care Team: Pomposini, Cherly Anderson, MD as PCP - General (Internal Medicine) Donato Heinz, MD as PCP - Cardiology (Cardiology)  This Provider for this visit: Treatment Team:  Attending Provider: Brand Males, MD  [facility]  03/06/2022 -   Chief Complaint  Patient presents with   Follow-up    PFT performed today.  Pt states she has been doing okay since last visit. States she is feeling better compared to last visit. Has a mild cough.     HPI Jenna Hancock 61 y.o. -returns for follow-up.  She is in the middle of another exacerbation.  She is on antibiotic Augmentin and also prednisone.  She feels remarkably better.  She states she has nearly quit smoking but does admit that she smoking a few puffs here and there.  She also continues to get exposed to the chicken coop even though she says she wears a mask.  She had pulmonary function test.  The previous one was in 2018.  This marked decline in the pulmonary function testing.  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  DLCO still normal.  Her June 2023 CT chest showed resolution of her RB ILD.  There is no emphysema reported but she is behaving like COPD.  She has quit her fish oil upon my advice.  Nevertheless she still getting frequent exacerbations.  She is on Stiolto.  She is not on Daliresp and her mother is on it.  She asked me about this medication.  She is  also not on triple inhaler therapy.    CT Chest data - HRCT 11/27/21  Narrative & Impression  CLINICAL DATA:  Interstitial lung disease, follow-up pneumonia   EXAM: CT CHEST WITHOUT CONTRAST   TECHNIQUE: Multidetector CT imaging of the chest was performed following the standard protocol without intravenous contrast. High resolution imaging of the lungs, as well as inspiratory and expiratory imaging, was performed.   RADIATION DOSE REDUCTION: This exam was performed according to the departmental dose-optimization program which includes automated exposure control, adjustment of the mA and/or kV according to patient size and/or use of iterative reconstruction technique.   COMPARISON:  10/09/2021   FINDINGS: Cardiovascular: Aortic atherosclerosis. Normal heart size. Scattered left coronary artery calcifications. No pericardial effusion.   Mediastinum/Nodes: No enlarged mediastinal, hilar, or axillary lymph nodes. Thyroid gland, trachea, and esophagus demonstrate no significant findings.   Lungs/Pleura:  Previously seen widespread, somewhat geographic airspace opacity throughout the lungs is almost completely resolved, with only scattered areas of residual ground-glass, for example in the left apex (series 6, image 25) and in the superior segment left lower lobe (series 6, image 112). Mild lobular air trapping on expiratory phase imaging. No pleural effusion or pneumothorax.   Upper Abdomen: No acute abnormality.  Rim calcified gallstone.   Musculoskeletal: No chest wall abnormality. No suspicious osseous lesions identified.   IMPRESSION: 1. Previously seen widespread, somewhat geographic airspace opacity throughout the lungs is almost completely resolved, with only scattered areas of residual ground-glass, for example in the left apex and in the superior segment left lower lobe. Findings are consistent with resolution of infection or inflammation. 2. No residual evidence  of fibrotic interstitial lung disease. 3. Mild lobular air trapping on expiratory phase imaging, suggestive of small airways disease. 4. Coronary artery disease. 5. Cholelithiasis.   Aortic Atherosclerosis (ICD10-I70.0).     Electronically Signed   By: Delanna Ahmadi M.D.   On: 11/29/2021 12:47    No results found.     OV 06/05/2022  Subjective:  Patient ID: Jenna Hancock, female , DOB: December 18, 1960 , age 41 y.o. , MRN: [id number] , ADDRESS: [address] [place] [id number] PCP Pomposini, Cherly Anderson, MD Patient Care Team: Pomposini, Cherly Anderson, MD as PCP - General (Internal Medicine) Freada Bergeron, MD as PCP - Cardiology (Cardiology)  This Provider for this visit: Treatment Team:  Attending Provider: Brand Males, MD    06/05/2022 -   Chief Complaint  Patient presents with   Follow-up    F/up had strept, coughing up yellowish mucous, sinus pressure and ear clog on right side.    RB-ILD  - resolved in June 2023 high-res CT chest. Smoking Chicken COOP Exposure Behaves like COPD because of previous smoking  but no eviddnce of copd  - CT June 2023: no emphysema but has air trapping -with recurrent exacerbation, family history, exacerbations with wheeze  -PFT 2018 that was normal and no emphysema described  - PFT sept 2-023 with restrciton and normal DLCO  - normal IgE Oct 2023  - eos 2022 in Oct 2023  - normal HP Panel 2019  HPI Jenna Hancock 61 y.o. -returns for follow-up.  She says she has been sick again since November 2023.  She has quit smoking.  She has reduced the exposure to chicken coop.  Despite this she is still getting repeated sinus infection and respiratory bronchitis.  Positive confirmation of strep throat and then she has been on antibiotics and prednisone.  Currently finishing a course of prednisone.  She has been on antibiotics and prednisone at least 3 times since she last saw me.  Her mom was hospitalized for delirium and sepsis.  Her  boyfriend was hospitalized for orthopedic issues.  She has upcoming sinus CT in January 2024 at [facility].  She also got an allergy appointment pending.  Review of the records indicate that we do not have a RAST allergy immune panel on her.  We also do not have a sinus CT on her.  She is currently on Breztri and Roflumilast.       PFT     Latest Ref Rng & Units 03/06/2022    9:02 AM 04/22/2017   10:52 AM  PFT Results  FVC-Pre L 1.91  2.73   FVC-Predicted Pre % 59  81   FVC-Post L 2.07  2.72   FVC-Predicted Post %  64  81   Pre FEV1/FVC % % 75  78   Post FEV1/FCV % % 82  78   FEV1-Pre L 1.44  2.13   FEV1-Predicted Pre % 57  82   FEV1-Post L 1.69  2.11   DLCO uncorrected ml/min/mmHg 17.37  20.04   DLCO UNC% % 87  85   DLCO corrected ml/min/mmHg 17.37  20.04   DLCO COR %Predicted % 87  85   DLVA Predicted % 99  88   TLC L 5.84  5.16   TLC % Predicted % 117  103   RV % Predicted % 185  121        has a past medical history of Anxiety, Arthritis, Depression, Essential hypertension, benign (12/08/2016), GERD (gastroesophageal reflux disease), Heart murmur, High cholesterol (12/08/2016), Hyperlipidemia, Hypertension, PONV (postoperative nausea and vomiting), Sleep apnea, Stroke ([place]), and TIA (transient ischemic attack) (2017).   reports that she quit smoking about 8 months ago. Her smoking use included cigarettes. She started smoking about 40 years ago. She has a 34.00 pack-year smoking history. She has never used smokeless tobacco.  Past Surgical History:  Procedure Laterality Date   APPENDECTOMY     BIOPSY  12/10/2021   Procedure: BIOPSY;  Surgeon: Harvel Quale, MD;  Location: [facility];  Service: Gastroenterology;;   COLONOSCOPY WITH PROPOFOL N/A 12/10/2021   Procedure: COLONOSCOPY WITH PROPOFOL;  Surgeon: Harvel Quale, MD;  Location: [facility];  Service: Gastroenterology;  Laterality: N/A;  815   JOINT REPLACEMENT     rt knee 11 yrs ago   KNEE  ARTHROSCOPY Left    NASAL SINUS SURGERY     POLYPECTOMY  12/10/2021   Procedure: POLYPECTOMY;  Surgeon: Harvel Quale, MD;  Location: [facility];  Service: Gastroenterology;;   TOTAL KNEE ARTHROPLASTY Left 02/01/2020   Procedure: TOTAL KNEE ARTHROPLASTY;  Surgeon: Susa Day, MD;  Location: [facility];  Service: Orthopedics;  Laterality: Left;  general or spinal    Allergies  Allergen Reactions   Bactrim Rash   Sulfa Antibiotics Rash    Immunization History  Administered Date(s) Administered   Influenza Inj Mdck Quad Pf 03/14/2019   Influenza Inj Mdck Quad With Preservative 04/05/2018   Influenza,inj,Quad PF,6+ Mos 04/16/2014, 04/16/2017   Influenza,inj,quad, With Preservative 04/20/2017, 04/05/2018, 03/14/2019, 04/20/2020, 04/10/2021   Influenza-Unspecified 04/25/2015, 04/05/2022   Moderna Sars-Covid-2 Vaccination 08/15/2019, 09/15/2019, 04/16/2020   PNEUMOCOCCAL CONJUGATE-20 05/16/2021   Pneumococcal Polysaccharide-23 02/24/2019   Tdap 02/16/2012   Zoster Recombinat (Shingrix) 03/08/2018   Zoster, Unspecified 03/08/2018    Family History  Problem Relation Age of Onset   Diabetes Father    Stroke Father    Heart disease Father    Lung cancer Mother      Current Outpatient Medications:    albuterol (PROVENTIL) (2.5 MG/3ML) 0.083% nebulizer solution, Take 3 mLs (2.5 mg total) by nebulization every 6 (six) hours as needed for wheezing or shortness of breath., Disp: 75 mL, Rfl: 12   Budeson-Glycopyrrol-Formoterol (BREZTRI AEROSPHERE) 160-9-4.8 MCG/ACT AERO, Inhale 2 puffs into the lungs in the morning and at bedtime., Disp: 10.7 g, Rfl: 6   ALPRAZolam (XANAX) 0.5 MG tablet, Take 0.5 mg by mouth 2 (two) times daily as needed for anxiety. , Disp: , Rfl:    amLODipine (NORVASC) 5 MG tablet, Take 5 mg by mouth daily., Disp: , Rfl:    aspirin 325 MG tablet, Take 325 mg by mouth at bedtime., Disp: , Rfl:    atorvastatin (  LIPITOR) 80 MG tablet, Take 80 mg by mouth  daily., Disp: , Rfl:    ezetimibe (ZETIA) 10 MG tablet, Take 10 mg by mouth every evening., Disp: , Rfl:    FLUoxetine (PROZAC) 40 MG capsule, Take 80 mg by mouth daily., Disp: , Rfl:    folic acid (FOLVITE) 283 MCG tablet, Take 800 mcg by mouth daily., Disp: , Rfl:    HYDROcodone-acetaminophen (NORCO) 10-325 MG tablet, Take 1 tablet by mouth in the morning and at bedtime., Disp: , Rfl:    irbesartan-hydrochlorothiazide (AVALIDE) 300-12.5 MG per tablet, Take 1 tablet by mouth daily., Disp: , Rfl:    metoprolol tartrate (LOPRESSOR) 25 MG tablet, Take 25 mg by mouth 2 (two) times daily., Disp: , Rfl:    Multiple Vitamins-Minerals (MULTIVITAMIN WITH MINERALS) tablet, Take 1 tablet by mouth daily., Disp: , Rfl:    nicotine (NICODERM CQ - DOSED IN MG/24 HOURS) 21 mg/24hr patch, Place 1 patch (21 mg total) onto the skin daily. (Patient not taking: Reported on 04/22/2022), Disp: 30 patch, Rfl: 1   pantoprazole (PROTONIX) 40 MG tablet, Take 30- 60 min before your first and last meals of the day (Patient taking differently: Take 40 mg by mouth daily.), Disp: , Rfl:    Roflumilast (DALIRESP) 250 MCG TABS, Take 1 tablet by mouth daily., Disp: 28 tablet, Rfl: 0      Objective:   Vitals:   06/05/22 1002  BP: 110/60  Pulse: 66  Temp: 98 F (36.7 C)  TempSrc: Oral  SpO2: 96%  Weight: 203 lb (92.1 kg)  Height: '5\' 4"'$  (1.626 m)    Estimated body mass index is 34.84 kg/m as calculated from the following:   Height as of this encounter: '5\' 4"'$  (1.626 m).   Weight as of this encounter: 203 lb (92.1 kg).  '@WEIGHTCHANGE'$ @  [employer]   06/05/22 1002  Weight: 203 lb (92.1 kg)     Physical Exam General: No distress. Som cough. Looks stable Neuro: Alert and Oriented x 3. GCS 15. Speech normal Psych: Pleasant Resp:  Barrel Chest - no.  Wheeze - no, Crackles - no, No overt respiratory distress CVS: Normal heart sounds. Murmurs - no Ext: Stigmata of Connective Tissue Disease - no HEENT: Normal upper  airway. PEERL +. No post nasal drip        Assessment:       ICD-10-CM   1. Acute bronchitis, unspecified organism  J20.9     2. Recurrent sinus infections  J32.9     3. Chronic cough  R05.3     4. Quit smoking  Z87.891     5. COPD, frequent exacerbations ([place])  J44.1          Plan:     Patient Instructions     ICD-10-CM   1. Acute bronchitis, unspecified organism  J20.9     2. Recurrent sinus infections  J32.9     3. Chronic cough  R05.3     4. Quit smoking  Z87.891     5. COPD, frequent exacerbations ([place])  J44.1        Ongoing recurrent sinus/acute bronchitis exacerbations.  This is ongoing despite quitting smoking recently and reducing exposure to the chicken coopd.  Need to rule out sinus and allergy issues.  Currently finishing another course of prednisone and dealing with significant chronic cough  Plan -Complete your current prednisone taper given by primary care -Start Tessalon cough 200.  Milligrams 3 times daily for cough -1-2 weeks  after completing prednisone -do CBC with differential and RAST blood allergy test  -Can do at our office or at [place] appointment in January 2024 for CT scan of the sinus  -CMA to ensure there is an appointment -Keep appointment with allergist for January 2024 Continue BREZTRI 2 puff twice daily -Continue roflumilast daily -Congratulations on quitting smoking -Avoid chicken coop exposure   Follow-up - Return to see Dr. Chase Caller or nurse practitioner in 2 months     SIGNATURE    Dr. Brand Males, M.D., F.C.C.P,  Pulmonary and Critical Care Medicine Staff Physician, [place] Director - Interstitial Lung Disease  Program  Pulmonary [place] at [place], [place], [address]  Pager: [phone], If no answer or between  15:00h - 7:00h: call [phone] Telephone: [phone]  10:38 AM 06/05/2022

## 2022-06-05 NOTE — Patient Instructions (Addendum)
ICD-10-CM   1. Acute bronchitis, unspecified organism  J20.9     2. Recurrent sinus infections  J32.9     3. Chronic cough  R05.3     4. Quit smoking  Z87.891     5. COPD, frequent exacerbations ([place])  J44.1        Ongoing recurrent sinus/acute bronchitis exacerbations.  This is ongoing despite quitting smoking recently and reducing exposure to the chicken coopd.  Need to rule out sinus and allergy issues.  Currently finishing another course of prednisone and dealing with significant chronic cough  Plan -Complete your current prednisone taper given by primary care -Start Tessalon cough 200.  Milligrams 3 times daily for cough -1-2 weeks after completing prednisone -do CBC with differential and RAST blood allergy test  -Can do at our office or at [place] appointment in January 2024 for CT scan of the sinus  -CMA to ensure there is an appointment -Keep appointment with allergist for January 2024 Continue BREZTRI 2 puff twice daily -Continue roflumilast daily -Congratulations on quitting smoking -Avoid chicken coop exposure   Follow-up - Return to see Dr. Chase Caller or nurse practitioner in 2 months

## 2022-06-20 ENCOUNTER — Ambulatory Visit (HOSPITAL_COMMUNITY)
Admission: RE | Admit: 2022-06-20 | Discharge: 2022-06-20 | Disposition: A | Discharge status: Home / Self Care | Payer: Medicare HMO | Source: Ambulatory Visit | Attending: Internal Medicine | Admitting: Internal Medicine

## 2022-06-20 DIAGNOSIS — J329 Chronic sinusitis, unspecified: Secondary | ICD-10-CM | POA: Insufficient documentation

## 2022-06-23 ENCOUNTER — Ambulatory Visit: Payer: Medicare HMO | Admitting: Internal Medicine

## 2022-06-23 ENCOUNTER — Encounter: Payer: Self-pay | Admitting: Internal Medicine

## 2022-06-23 ENCOUNTER — Other Ambulatory Visit: Payer: Self-pay

## 2022-06-23 VITALS — BP 112/56 | HR 73 | Temp 98.1°F | Resp 18 | Ht 63.0 in | Wt 204.4 lb

## 2022-06-23 DIAGNOSIS — H1013 Acute atopic conjunctivitis, bilateral: Secondary | ICD-10-CM

## 2022-06-23 DIAGNOSIS — B999 Unspecified infectious disease: Secondary | ICD-10-CM

## 2022-06-23 DIAGNOSIS — J3089 Other allergic rhinitis: Secondary | ICD-10-CM

## 2022-06-23 MED ORDER — AZELASTINE HCL 0.1 % NA SOLN: 1.0000 | Freq: Two times a day (BID) | NASAL | 5 refills | Status: DC

## 2022-06-23 MED ORDER — CETIRIZINE HCL 10 MG PO TABS: 10.0000 mg | ORAL_TABLET | Freq: Every day | ORAL | 5 refills | Status: DC | PRN

## 2022-06-23 MED ORDER — FLUTICASONE PROPIONATE 50 MCG/ACT NA SUSP: 2.0000 | Freq: Every day | NASAL | 5 refills | Status: DC

## 2022-06-23 NOTE — Addendum Note (Signed)
Addended by: Norville Haggard on: 06/23/2022 04:17 PM   Modules accepted: Orders

## 2022-06-23 NOTE — Addendum Note (Signed)
Addended by: Norville Haggard on: 06/23/2022 04:24 PM   Modules accepted: Orders

## 2022-06-23 NOTE — Patient Instructions (Addendum)
Allergic Rhinitis Allergic Conjunctivitis  - Positive skin test 06/2022: mouse, mold, cockroach - Avoidance measures discussed. - Use nasal saline rinses before nose sprays such as with Neilmed Sinus Rinse.  Use distilled water.   - Use Flonase 2 sprays each nostril daily. Aim upward and outward. - Use Azelastine 1-2 sprays each nostril twice daily as needed. Aim upward and outward. - Use Zyrtec 10 mg daily as needed for runny nose, itchy watery eyes, sneezing.    Recurrent Infections- sinusitis  - Due to history of recurrent upper respiratory infection, will do an immune screen with IgG, IgM, IgA and S pneumo/tetanus titers.   - She has has a CT sinus 06/2022 that was normal.  Previously saw ENT and underwent sinus surgery with them. - If persistent, can consider ENT evaluation.     ALLERGEN AVOIDANCE MEASURES  Molds - Indoor avoidance Use air conditioning to reduce indoor humidity.  Do not use a humidifier. Keep indoor humidity at 30 - 40%.  Use a dehumidifier if needed. In the bathroom use an exhaust fan or open a window after showering.  Wipe down damp surfaces after showering.  Clean bathrooms with a mold-killing solution (diluted bleach, or products like Tilex, etc) at least once a month. In the kitchen use an exhaust fan to remove steam from cooking.  Throw away spoiled foods immediately, and empty garbage daily.  Empty water pans below self-defrosting refrigerators frequently. Vent the clothes dryer to the outside. Limit indoor houseplants; mold grows in the dirt.  No houseplants in the bedroom. Remove carpet from the bedroom. Encase the mattress and box springs with a zippered encasing.  Molds - Outdoor avoidance Avoid being outside when the grass is being mowed, or the ground is tilled. Avoid playing in leaves, pine straw, hay, etc.  Dead plant materials contain mold. Avoid going into barns or grain storage areas. Remove leaves, clippings and compost from around the  home.  Cockroach Limit spread of food around the house; especially keep food out of bedrooms. Keep food and garbage in closed containers with a tight lid.  Never leave food out in the kitchen.  Do not leave out pet food or dirty food bowls. Mop the kitchen floor and wash countertops at least once a week. Repair leaky pipes and faucets so there is no standing water to attract roaches. Plug up cracks in the house through which cockroaches can enter. Use bait stations and approved pesticides to reduce cockroach infestation.

## 2022-06-23 NOTE — Progress Notes (Signed)
NEW PATIENT  Date of Service/Encounter:  06/23/22  Consult requested by: Clinton Quant, MD   Subjective:   Jenna Hancock (DOB: June 21, 1960) is a 62 y.o. female who presents to the clinic on 06/23/2022 with a chief complaint of Frequent Infections (Gets sick often ) and Allergic Rhinitis  (Congestion, runny nose, fever, achey ) .    History obtained from: chart review and patient.   COPD/RB-ILD Recurrent Infections Followed by Dr. Chase Caller for COPD and hx of ILD; referred to Allergy for recurrent sinus infections.  Had prior exposure to chicken coop and smoking but has stopped both.  She last saw him 06/05/2022 and reports having recurrent sinus infections and also strep infection requiring antibiotics.  She has also had bronchitis requiring frequent prednisone courses.  She is on Breztri and Roflumilast and nighttime O2.  Underwent CT sinus 06/2022 that was unremarkable.  In terms of infections, reports she has had frequent upper respiratory/sinus infections since April 2023 requiring antibiotic use. She also was hospitalized April 2023 for multifocal pneumonia ad treated with IV abx.  She reports being UTD on her vaccinations.  No GI infection/brain/bone/skin infections.  Last use of prednisone was in Dec 2023.   Rhinitis:  Started since childhood. It has worsened the past few years.  Symptoms include: nasal congestion, rhinorrhea, post nasal drainage, sneezing, watery eyes, and itchy eyes  Occurs year-round Potential triggers: working at [employer] with exposure to tires  Treatments tried:  Flonase  Zyrtec PRN; last use was long time ago  Previous allergy testing: no History of reflux/heartburn: yes controlled with Protonix '40mg'$  daily.   History of sinus surgery: yes 6-7 years ago with Dr. Wilburn Cornelia with [facility] Nonallergic triggers: strong odors   Past Medical History: Past Medical History:  Diagnosis Date   Anxiety    Arthritis    Depression    Essential  hypertension, benign 12/08/2016   GERD (gastroesophageal reflux disease)    Heart murmur    AS A CHILD    High cholesterol 12/08/2016   Hyperlipidemia    Hypertension    PONV (postoperative nausea and vomiting)    Recurrent upper respiratory infection (URI)    Sleep apnea    DOES NOT USE CPAP    Stroke ([place])    HX OF MINI STROKE 2016    TIA (transient ischemic attack) 2017   Past Surgical History: Past Surgical History:  Procedure Laterality Date   APPENDECTOMY     BIOPSY  12/10/2021   Procedure: BIOPSY;  Surgeon: Harvel Quale, MD;  [facility];  Service: Gastroenterology;;   COLONOSCOPY WITH PROPOFOL N/A 12/10/2021   Procedure: COLONOSCOPY WITH PROPOFOL;  Surgeon: Harvel Quale, MD;  Location: [facility];  Service: Gastroenterology;  Laterality: N/A;  815   JOINT REPLACEMENT     rt knee 11 yrs ago   KNEE ARTHROSCOPY Left    NASAL SINUS SURGERY     POLYPECTOMY  12/10/2021   Procedure: POLYPECTOMY;  Surgeon: Harvel Quale, MD;  Location: [facility];  Service: Gastroenterology;;   TOTAL KNEE ARTHROPLASTY Left 02/01/2020   Procedure: TOTAL KNEE ARTHROPLASTY;  Surgeon: Susa Day, MD;  Location: [facility];  Service: Orthopedics;  Laterality: Left;  general or spinal    Family History: Family History  Problem Relation Age of Onset   Lung cancer Mother    Diabetes Father    Stroke Father    Heart disease Father    Asthma Son  Social History:  Lives in a 27 year house Flooring in bedroom: laminate Pets: dog Tobacco use/exposure: quit; 1990-08/2021 1ppd Job: none  Medication List:  Allergies as of 06/23/2022       Reactions   Bactrim Rash   Sulfa Antibiotics Rash        Medication List        Accurate as of June 23, 2022 11:57 AM. If you have any questions, ask your nurse or doctor.          albuterol (2.5 MG/3ML) 0.083% nebulizer solution Commonly known as: PROVENTIL Take 3 mLs (2.5 mg total) by  nebulization every 6 (six) hours as needed for wheezing or shortness of breath.   ALPRAZolam 0.5 MG tablet Commonly known as: XANAX Take 0.5 mg by mouth 2 (two) times daily as needed for anxiety.   amLODipine 5 MG tablet Commonly known as: NORVASC Take 5 mg by mouth daily.   aspirin 325 MG tablet Take 325 mg by mouth at bedtime.   atorvastatin 80 MG tablet Commonly known as: LIPITOR Take 80 mg by mouth daily.   benzonatate 200 MG capsule Commonly known as: TESSALON Take 1 capsule (200 mg total) by mouth 3 (three) times daily as needed for cough.   Breztri Aerosphere 160-9-4.8 MCG/ACT Aero Generic drug: Budeson-Glycopyrrol-Formoterol Inhale 2 puffs into the lungs in the morning and at bedtime.   ezetimibe 10 MG tablet Commonly known as: ZETIA Take 10 mg by mouth every evening.   FLUoxetine 40 MG capsule Commonly known as: PROZAC Take 80 mg by mouth daily.   folic acid 034 MCG tablet Commonly known as: FOLVITE Take 800 mcg by mouth daily.   HYDROcodone-acetaminophen 10-325 MG tablet Commonly known as: NORCO Take 1 tablet by mouth in the morning and at bedtime.   irbesartan-hydrochlorothiazide 300-12.5 MG tablet Commonly known as: AVALIDE Take 1 tablet by mouth daily.   metoprolol tartrate 25 MG tablet Commonly known as: LOPRESSOR Take 25 mg by mouth 2 (two) times daily.   multivitamin with minerals tablet Take 1 tablet by mouth daily.   nicotine 21 mg/24hr patch Commonly known as: NICODERM CQ - dosed in mg/24 hours Place 1 patch (21 mg total) onto the skin daily.   pantoprazole 40 MG tablet Commonly known as: PROTONIX Take 30- 60 min before your first and last meals of the day What changed:  how much to take how to take this when to take this additional instructions   Roflumilast 250 MCG Tabs Commonly known as: Daliresp Take 1 tablet by mouth daily.         REVIEW OF SYSTEMS: Pertinent positives and negatives discussed in HPI.   Objective:    Physical Exam: BP (!) 112/56   Pulse 73   Temp 98.1 F (36.7 C)   Resp 18   Ht '5\' 3"'$  (1.6 m)   Wt 204 lb 6 oz (92.7 kg)   SpO2 96%   BMI 36.20 kg/m  Body mass index is 36.2 kg/m. GEN: alert, well developed HEENT: clear conjunctiva, TM grey and translucent, nose with + inferior turbinate hypertrophy, pink  nasal mucosa, no rhinorrhea, + cobblestoning HEART: regular rate and rhythm, no murmur LUNGS: clear to auscultation bilaterally, no coughing, unlabored respiration ABDOMEN: soft, non distended  SKIN: no rashes or lesions  Reviewed:  2019: seen by Pulm for RB-ILD with repeat CT showing resolution.she has quit smoking 08/2021.   09/19/2021: seen in ER for fever, cough, SOB with hypoxia in office and sent to ER  for evaluation. Positive rhinovirus. CT chest with extensive patch bl ground glass, most likely infectious/inflammatory. Atypical/viral infection possible. Started on IV Rocephin/Azithromycin and Solumedrol.   PFT 03/06/2022: moderate obstruction with reversibility   Skin Testing:  Skin prick testing was placed, which includes aeroallergens/foods, histamine control, and saline control.  Verbal consent was obtained prior to placing test.  Patient tolerated procedure well.  Allergy testing results were read and interpreted by myself, documented by clinical staff. Adequate positive and negative control.  Results discussed with patient/family.  Airborne Adult Perc - 06/23/22 1021     Time Antigen Placed 1021    Allergen Manufacturer Lavella Hammock    Location Back    Number of Test 59    1. Control-Buffer 50% Glycerol Negative    2. Control-Histamine 1 mg/ml 3+    3. Albumin saline Negative    4. Scotia Negative    5. Guatemala Negative    6. Johnson Negative    7. [place] Blue Negative    8. Meadow Fescue Negative    9. Perennial Rye Negative    10. Sweet Vernal Negative    11. Timothy Negative    12. Cocklebur Negative    13. Burweed Marshelder Negative    14. Ragweed,  short Negative    15. Ragweed, Giant Negative    16. Plantain,  English Negative    17. Lamb's Quarters Negative    18. Sheep Sorrell Negative    19. Rough Pigweed Negative    20. Marsh Elder, Rough Negative    21. Mugwort, Common Negative    22. Ash mix Negative    23. Birch mix Negative    24. Beech American Negative    25. Box, Elder Negative    26. Cedar, red Negative    27. Cottonwood, [place] Negative    28. Elm mix Negative    29. Hickory Negative    30. Maple mix Negative    31. Oak, [place] mix Negative    32. Pecan Pollen Negative    33. Pine mix Negative    34. Sycamore Eastern Negative    35. Keystone, Black Pollen Negative    36. Alternaria alternata Negative    37. Cladosporium Herbarum Negative    38. Aspergillus mix Negative    39. Penicillium mix Negative    40. Bipolaris sorokiniana (Helminthosporium) Negative    41. Drechslera spicifera (Curvularia) Negative    42. Mucor plumbeus Negative    43. Fusarium moniliforme Negative    44. Aureobasidium pullulans (pullulara) Negative    45. Rhizopus oryzae Negative    46. Botrytis cinera Negative    47. Epicoccum nigrum Negative    48. Phoma betae Negative    49. Candida Albicans Negative    50. Trichophyton mentagrophytes Negative    51. Mite, D Farinae  5,000 AU/ml Negative    52. Mite, D Pteronyssinus  5,000 AU/ml Negative    53. Cat Hair 10,000 BAU/ml Negative    54.  Dog Epithelia Negative    55. Mixed Feathers Negative    56. Horse Epithelia Negative    57. Cockroach, German Negative    58. Mouse 3+    59. Tobacco Leaf Negative             Intradermal - 06/23/22 1108     Time Antigen Placed 1108    Allergen Manufacturer Greer    Location Arm    Number of Test 15  Assessment:   1. Recurrent infections   2. Perennial allergic rhinitis   3. Allergic conjunctivitis of both eyes     Plan/Recommendations:  Allergic Rhinitis Allergic Conjunctivitis  - Positive skin test  06/2022: mouse, mold, cockroach - Avoidance measures discussed. - Use nasal saline rinses before nose sprays such as with Neilmed Sinus Rinse.  Use distilled water.   - Use Flonase 2 sprays each nostril daily. Aim upward and outward. - Use Azelastine 1-2 sprays each nostril twice daily as needed. Aim upward and outward. - Use Zyrtec 10 mg daily as needed for runny nose, itchy watery eyes, sneezing.  - Will treat rhinitis with INCS/INAH but I am not sure if this alone is driving her recurrent sinus infections.   Recurrent Infections- sinusitis  - Due to history of recurrent upper respiratory infection, will do an immune screen with IgG, IgM, IgA and S pneumo/tetanus titers.   - She has has a CT sinus 06/2022 that was normal.  Previously saw ENT and underwent sinus surgery with them. If persistent, can consider ENT evaluation.    COPD/Hx of RB-ILD - Continue follow up with Pulm: Breztri 2 puffs BID, Roflumilast, Albuterol  Return in about 6 weeks (around 08/04/2022).  Harlon Flor, MD [facility]

## 2022-06-23 NOTE — Addendum Note (Signed)
Addended by: Norville Haggard on: 06/23/2022 04:19 PM   Modules accepted: Orders

## 2022-06-27 LAB — IGG, IGA, IGM
IgA/Immunoglobulin A, Serum: 354 mg/dL — ABNORMAL HIGH (ref 87–352)
IgG (Immunoglobin G), Serum: 1098 mg/dL (ref 586–1602)
IgM (Immunoglobulin M), Srm: 68 mg/dL (ref 26–217)

## 2022-06-27 LAB — STREP PNEUMONIAE 23 SEROTYPES IGG
Pneumo Ab Type 1*: 0.7 ug/mL — ABNORMAL LOW (ref >1.3)
Pneumo Ab Type 12 (12F)*: 0.5 ug/mL — ABNORMAL LOW (ref >1.3)
Pneumo Ab Type 14*: 0.5 ug/mL — ABNORMAL LOW (ref >1.3)
Pneumo Ab Type 17 (17F)*: 0.6 ug/mL — ABNORMAL LOW (ref >1.3)
Pneumo Ab Type 19 (19F)*: 0.5 ug/mL — ABNORMAL LOW (ref >1.3)
Pneumo Ab Type 2*: 0.9 ug/mL — ABNORMAL LOW (ref >1.3)
Pneumo Ab Type 20*: 1 ug/mL — ABNORMAL LOW (ref >1.3)
Pneumo Ab Type 22 (22F)*: 0.7 ug/mL — ABNORMAL LOW (ref >1.3)
Pneumo Ab Type 23 (23F)*: 0.3 ug/mL — ABNORMAL LOW (ref >1.3)
Pneumo Ab Type 26 (6B)*: 0.4 ug/mL — ABNORMAL LOW (ref >1.3)
Pneumo Ab Type 3*: 0.3 ug/mL — ABNORMAL LOW (ref >1.3)
Pneumo Ab Type 34 (10A)*: 1.9 ug/mL (ref >1.3)
Pneumo Ab Type 4*: 0.2 ug/mL — ABNORMAL LOW (ref >1.3)
Pneumo Ab Type 43 (11A)*: 0.2 ug/mL — ABNORMAL LOW (ref >1.3)
Pneumo Ab Type 5*: 0.4 ug/mL — ABNORMAL LOW (ref >1.3)
Pneumo Ab Type 51 (7F)*: 1.2 ug/mL — ABNORMAL LOW (ref >1.3)
Pneumo Ab Type 54 (15B)*: 0.6 ug/mL — ABNORMAL LOW (ref >1.3)
Pneumo Ab Type 56 (18C)*: 0.5 ug/mL — ABNORMAL LOW (ref >1.3)
Pneumo Ab Type 57 (19A)*: 3.5 ug/mL (ref >1.3)
Pneumo Ab Type 68 (9V)*: 1.1 ug/mL — ABNORMAL LOW (ref >1.3)
Pneumo Ab Type 70 (33F)*: 1 ug/mL — ABNORMAL LOW (ref >1.3)
Pneumo Ab Type 8*: 1 ug/mL — ABNORMAL LOW (ref >1.3)
Pneumo Ab Type 9 (9N)*: 0.4 ug/mL — ABNORMAL LOW (ref >1.3)

## 2022-06-27 LAB — DIPHTHERIA / TETANUS ANTIBODY PANEL
Diphtheria Ab: 0.14 IU/mL (ref <0.10)
Tetanus Ab, IgG: 0.94 IU/mL (ref <0.10)

## 2022-06-30 ENCOUNTER — Telehealth: Payer: Self-pay | Admitting: Internal Medicine

## 2022-06-30 MED ORDER — PREDNISONE 10 MG PO TABS: ORAL_TABLET | ORAL | 0 refills | Status: DC

## 2022-06-30 MED ORDER — DOXYCYCLINE HYCLATE 100 MG PO TABS: 100.0000 mg | ORAL_TABLET | Freq: Two times a day (BID) | ORAL | 0 refills | Status: DC

## 2022-06-30 NOTE — Telephone Encounter (Signed)
Needs to covid swab before tomorrow. I went send in abx and prednisone for bronchitis/COPD exacerbation

## 2022-06-30 NOTE — Telephone Encounter (Signed)
Yes she sould

## 2022-06-30 NOTE — Telephone Encounter (Signed)
Spoke with patient she states for the last 5 days she has been coughing with yellow phlegm, aching, nasal congestion and sob with exertion. She had a fever yesterday of 100.5 and has been taking tylenol. She is also taking mucinex but that doesn't seem to be helping.  She is scheduled with an in office visit with [facility] tomorrow but wonders if she can just get some medication instead.  Beth please advise since MR is not available   Pharmacy is [facility] in [place]

## 2022-06-30 NOTE — Telephone Encounter (Signed)
Called and spoke with pt letting her know info per [place]. Pt said she had done a covid test and it came back negative.   Since meds were sent to pharmacy for pt, she wants to know if she still needs to come for her appt. Beth, please advise.

## 2022-06-30 NOTE — Telephone Encounter (Signed)
Attempted to call pt but unable to reach. Left pt a detailed message letting her know that she still needs to come to appt. Nothing further needed.

## 2022-07-01 ENCOUNTER — Ambulatory Visit (INDEPENDENT_AMBULATORY_CARE_PROVIDER_SITE_OTHER): Payer: Medicare HMO | Admitting: Primary Care

## 2022-07-01 ENCOUNTER — Encounter: Payer: Self-pay | Admitting: Primary Care

## 2022-07-01 ENCOUNTER — Ambulatory Visit (INDEPENDENT_AMBULATORY_CARE_PROVIDER_SITE_OTHER): Payer: Medicare HMO

## 2022-07-01 VITALS — BP 112/64 | HR 74 | Ht 64.0 in | Wt 201.6 lb

## 2022-07-01 DIAGNOSIS — J441 Chronic obstructive pulmonary disease with (acute) exacerbation: Secondary | ICD-10-CM

## 2022-07-01 DIAGNOSIS — R051 Acute cough: Secondary | ICD-10-CM

## 2022-07-01 DIAGNOSIS — J9611 Chronic respiratory failure with hypoxia: Secondary | ICD-10-CM | POA: Diagnosis not present

## 2022-07-01 DIAGNOSIS — J310 Chronic rhinitis: Secondary | ICD-10-CM

## 2022-07-01 NOTE — Assessment & Plan Note (Addendum)
-  Productive cough > 1 month. Associated sinus drainage, wheezing. Rx Doxycycline '100mg'$  BID x 7 days and prednisone taper. Advised patient take Mucinex-dm '1200mg'$  twice daily x 7-10 days. Checking CXR. Continue Breztri Aerosphere two puffs twice daily and Daliresp. FU in February with Dr. Chase Caller.

## 2022-07-01 NOTE — Patient Instructions (Signed)
CT sinuses were normal  Recommendations: Change to mucinex-dm '1200mg'$  twice daily for 7-10 days  Start nasal sprays as directed by Dr. Posey Pronto (flutivasone and Astelin) Continue abx and prednisone Continue Breztri aerosphere two puffs morning and evening Use supplemental oxygen as needed to keep O2 >88-90%  Call Dr. Reather Laurence and make sure its ok to proceed with injection tomorrow   Orders: CXR today Check O2 at rest on room air   Follow-up: Keep apt in February with Dr. Chase Caller

## 2022-07-01 NOTE — Progress Notes (Signed)
$'@Patient'L$  ID: Jenna Hancock, female    DOB: Nov 13, 1960, 62 y.o.   MRN: [id number]  Chief Complaint  Patient presents with   Follow-up    Coughing,SOB,Wheezing  a month     Referring provider: Pomposini, Cherly Anderson, MD  HPI: 62 year old female, former smoker. PMH significant for COPD, chronic respiratory failure, HTN, OSA. Patient of Dr. Chase Caller.   07/01/2022- Interim hx  Patient presents today for acute OV/cough. She was last seen on 06/05/22 for acute bronchitis/recurrent sinus infections. Hx COPD, former smoker. CXR in September showed chronic bronchitis changes. Maintained on Breztri Aerospher, zyrtec allergy, flonase nasal spray. She called our office yesterday with bronchitis symptoms. Se has had a chronic cough for > 1 month. Cough is productive with yellow mucus. Associated wheezing. Sent in doxycycline and prednisone taper yesterday. She saw Dr. Posey Pronto will allergy in [place]. CT sinuses was normal in January. She has underwent nasal surgery in the past. He recommend nasal sprays which she has not been using. She is compliant with Judithann Sauger Aerosphere twice daily as directed and daliresp. Wears 2L oxygen at bedtime and prn at home when acutely sick. She is scheduled to receive pneumovax tomorrow, advised she reschedule as she is acutely ill. She will contact [place] office.    Allergies  Allergen Reactions   Bactrim Rash   Sulfa Antibiotics Rash    Immunization History  Administered Date(s) Administered   Influenza Inj Mdck Quad Pf 03/14/2019   Influenza Inj Mdck Quad With Preservative 04/05/2018   Influenza,inj,Quad PF,6+ Mos 04/16/2014, 04/16/2017   Influenza,inj,quad, With Preservative 04/20/2017, 04/05/2018, 03/14/2019, 04/20/2020, 04/10/2021   Influenza-Unspecified 04/25/2015, 04/05/2022   Moderna Sars-Covid-2 Vaccination 08/15/2019, 09/15/2019, 04/16/2020   PNEUMOCOCCAL CONJUGATE-20 05/16/2021   Pneumococcal Polysaccharide-23 02/24/2019   Tdap 02/16/2012    Zoster Recombinat (Shingrix) 03/08/2018   Zoster, Unspecified 03/08/2018    Past Medical History:  Diagnosis Date   Anxiety    Arthritis    Depression    Essential hypertension, benign 12/08/2016   GERD (gastroesophageal reflux disease)    Heart murmur    AS A CHILD    High cholesterol 12/08/2016   Hyperlipidemia    Hypertension    PONV (postoperative nausea and vomiting)    Recurrent upper respiratory infection (URI)    Sleep apnea    DOES NOT USE CPAP    Stroke ([place])    HX OF MINI STROKE 2016    TIA (transient ischemic attack) 2017    Tobacco History: Social History   Tobacco Use  Smoking Status Former   Packs/day: 1.00   Years: 34.00   Total pack years: 34.00   Types: Cigarettes   Start date: 39   Quit date: 10/04/2021   Years since quitting: 0.7  Smokeless Tobacco Never  Tobacco Comments   Former smoker    Counseling given: Not Answered Tobacco comments: Former smoker    Outpatient Medications Prior to Visit  Medication Sig Dispense Refill   albuterol (PROVENTIL) (2.5 MG/3ML) 0.083% nebulizer solution Take 3 mLs (2.5 mg total) by nebulization every 6 (six) hours as needed for wheezing or shortness of breath. 75 mL 12   ALPRAZolam (XANAX) 0.5 MG tablet Take 0.5 mg by mouth 2 (two) times daily as needed for anxiety.      amLODipine (NORVASC) 5 MG tablet Take 5 mg by mouth daily.     aspirin 325 MG tablet Take 325 mg by mouth at bedtime.     atorvastatin (LIPITOR) 80 MG tablet Take 80  mg by mouth daily.     azelastine (ASTELIN) 0.1 % nasal spray Place 1 spray into both nostrils 2 (two) times daily. Use in each nostril as directed 30 mL 5   benzonatate (TESSALON) 200 MG capsule Take 1 capsule (200 mg total) by mouth 3 (three) times daily as needed for cough. 30 capsule 1   Budeson-Glycopyrrol-Formoterol (BREZTRI AEROSPHERE) 160-9-4.8 MCG/ACT AERO Inhale 2 puffs into the lungs in the morning and at bedtime. 10.7 g 6   cetirizine (ZYRTEC ALLERGY) 10 MG tablet  Take 1 tablet (10 mg total) by mouth daily as needed for allergies. 30 tablet 5   doxycycline (VIBRA-TABS) 100 MG tablet Take 1 tablet (100 mg total) by mouth 2 (two) times daily. 14 tablet 0   ezetimibe (ZETIA) 10 MG tablet Take 10 mg by mouth every evening.     FLUoxetine (PROZAC) 40 MG capsule Take 80 mg by mouth daily.     fluticasone (FLONASE) 50 MCG/ACT nasal spray Place 2 sprays into both nostrils daily. 16 g 5   folic acid (FOLVITE) 631 MCG tablet Take 800 mcg by mouth daily.     HYDROcodone-acetaminophen (NORCO) 10-325 MG tablet Take 1 tablet by mouth in the morning and at bedtime.     irbesartan-hydrochlorothiazide (AVALIDE) 300-12.5 MG per tablet Take 1 tablet by mouth daily.     metoprolol tartrate (LOPRESSOR) 25 MG tablet Take 25 mg by mouth 2 (two) times daily.     Multiple Vitamins-Minerals (MULTIVITAMIN WITH MINERALS) tablet Take 1 tablet by mouth daily.     nicotine (NICODERM CQ - DOSED IN MG/24 HOURS) 21 mg/24hr patch Place 1 patch (21 mg total) onto the skin daily. 30 patch 1   pantoprazole (PROTONIX) 40 MG tablet Take 30- 60 min before your first and last meals of the day (Patient taking differently: Take 40 mg by mouth daily.)     predniSONE (DELTASONE) 10 MG tablet 4 tabs for 2 days, then 3 tabs for 2 days, 2 tabs for 2 days, then 1 tab for 2 days, then stop 20 tablet 0   Roflumilast (DALIRESP) 250 MCG TABS Take 1 tablet by mouth daily. 28 tablet 0   No facility-administered medications prior to visit.      Review of Systems  Review of Systems  Constitutional: Negative.   HENT:  Positive for congestion.   Respiratory:  Positive for cough and wheezing.      Physical Exam  BP 112/64 (BP Location: Left Arm, Patient Position: Sitting, Cuff Size: Normal)   Pulse 74   Ht '5\' 4"'$  (1.626 m)   Wt 201 lb 9.6 oz (91.4 kg)   SpO2 99%   BMI 34.60 kg/m  Physical Exam Constitutional:      Appearance: Normal appearance.  HENT:     Head: Normocephalic and atraumatic.      Right Ear: Tympanic membrane normal. There is no impacted cerumen.     Left Ear: Tympanic membrane normal. There is no impacted cerumen.     Mouth/Throat:     Mouth: Mucous membranes are moist.     Pharynx: Oropharynx is clear.  Cardiovascular:     Rate and Rhythm: Normal rate and regular rhythm.  Pulmonary:     Effort: Pulmonary effort is normal.     Breath sounds: Normal breath sounds. No wheezing, rhonchi or rales.  Musculoskeletal:        General: Normal range of motion.  Skin:    General: Skin is warm and dry.  Neurological:  General: No focal deficit present.     Mental Status: She is alert and oriented to person, place, and time. Mental status is at baseline.  Psychiatric:        Mood and Affect: Mood normal.        Behavior: Behavior normal.        Thought Content: Thought content normal.        Judgment: Judgment normal.      Lab Results:  CBC    Component Value Date/Time   WBC 9.4 03/24/2022 1509   RBC 4.21 03/24/2022 1509   HGB 12.5 03/24/2022 1509   HCT 38.1 03/24/2022 1509   PLT 218 03/24/2022 1509   MCV 90.5 03/24/2022 1509   MCH 29.7 03/24/2022 1509   MCHC 32.8 03/24/2022 1509   RDW 12.9 03/24/2022 1509   LYMPHSABS 1.8 03/24/2022 1509   MONOABS 0.9 03/24/2022 1509   EOSABS 0.2 03/24/2022 1509   BASOSABS 0.0 03/24/2022 1509    BMET    Component Value Date/Time   NA 139 12/06/2021 1304   K 4.2 12/06/2021 1304   CL 104 12/06/2021 1304   CO2 28 12/06/2021 1304   GLUCOSE 101 (H) 12/06/2021 1304   BUN 20 12/06/2021 1304   CREATININE 0.86 12/06/2021 1304   CREATININE 0.90 12/08/2016 1143   CALCIUM 9.2 12/06/2021 1304   GFRNONAA >60 12/06/2021 1304   GFRAA >60 02/02/2020 0300    BNP    Component Value Date/Time   BNP 27.0 01/22/2017 1249    ProBNP No results found for: "PROBNP"  Imaging: CT MAXILLOFACIAL WO CONTRAST  Result Date: 06/20/2022 CLINICAL DATA:  Sinusitis. EXAM: CT MAXILLOFACIAL WITHOUT CONTRAST TECHNIQUE: Multidetector CT  imaging of the maxillofacial structures was performed. Multiplanar CT image reconstructions were also generated. RADIATION DOSE REDUCTION: This exam was performed according to the departmental dose-optimization program which includes automated exposure control, adjustment of the mA and/or kV according to patient size and/or use of iterative reconstruction technique. COMPARISON:  None Available. FINDINGS: Paranasal sinuses: Frontal: Normally aerated. Patent frontal sinus drainage pathways. Ethmoid: Normally aerated. Maxillary: Normally aerated. Status post sinonasal surgery bilaterally. Sphenoid: Normally aerated. Patent sphenoethmoidal recesses. Right ostiomeatal unit: Patent. Left ostiomeatal unit: Patent. Nasal passages: Patent. Intact nasal septum is midline. Other: Orbits and intracranial compartment are unremarkable. Visible mastoid air cells are normally aerated. IMPRESSION: 1. Normally aerated paranasal sinuses. Patent sinus drainage pathways. 2. Status post sinonasal surgery bilaterally. Electronically Signed   By: Ronney Asters M.D.   On: 06/20/2022 20:32     Assessment & Plan:   COPD with acute exacerbation (HCC) - Productive cough > 1 month. Associated sinus drainage, wheezing. Rx Doxycycline '100mg'$  BID x 7 days and prednisone taper. Advised patient take Mucinex-dm '1200mg'$  twice daily x 7-10 days. Checking CXR. Continue Breztri Aerosphere two puffs twice daily and Daliresp. FU in February with Dr. Chase Caller.   Chronic rhinitis - Chronic rhinitis/sinusitis with recurrent infections  - CT sinuses in January was normal. Following with allergy - Scheduled to receive pneumovax tomorrow, she will call to reschedule d.t acute illness  - Advised she start Fluticasone/ Azelastine as directed  Chronic respiratory failure ([place]) - Stable; O2 94% RA at rest - Continue supplemental o2 at night and as needed to maintained O2 >88-90%    Martyn Ehrich, NP 07/01/2022

## 2022-07-01 NOTE — Assessment & Plan Note (Signed)
-  Stable; O2 94% RA at rest - Continue supplemental o2 at night and as needed to maintained O2 >88-90%

## 2022-07-01 NOTE — Assessment & Plan Note (Addendum)
-  Chronic rhinitis/sinusitis with recurrent infections  - CT sinuses in January was normal. Following with allergy - Scheduled to receive pneumovax tomorrow, she will call to reschedule d.t acute illness  - Advised she start Fluticasone/ Azelastine as directed

## 2022-07-02 ENCOUNTER — Ambulatory Visit: Payer: Medicare HMO

## 2022-07-10 ENCOUNTER — Telehealth: Payer: Self-pay | Admitting: Primary Care

## 2022-07-10 MED ORDER — LEVOFLOXACIN 500 MG PO TABS: 500.0000 mg | ORAL_TABLET | Freq: Every day | ORAL | 0 refills | Status: DC

## 2022-07-10 NOTE — Telephone Encounter (Signed)
Called and spoke with patient. She stated that she was seen by [facility] on 01/16 but she is not feeling any better. She has finished the doxy and prednisone taper.   She has a productive cough with thick yellowish/brown phlegm. She developed a fever 3 nights ago. She stated that the fever normally starts around 230am every night. She also has a sore throat, body aches and increased fatigue. She has been testing herself daily for covid and the tests have all been negative. Denied any sick exposures because she has been in her home for the past week.   She confirmed that she is still using her Breztri twice daily.   She wanted to know if Beth had recommendations for her.   Pharmacy is [facility] in [place].   Beth, can you please advise? Thanks!

## 2022-07-10 NOTE — Telephone Encounter (Signed)
Called and spoke with patient. She verbalized understanding. She wanted to know if Beth had sent in anything for the cough. I advised her that she did not and that she had left for the day. She stated that she had been using Delsym every 12 hours but that was no longer working. She wanted to see if Jenna Hancock would recommend anything else for her.   She is aware that Jenna Hancock is gone for the day and will respond tomorrow.   Beth, can you please advise? Thanks!

## 2022-07-10 NOTE — Telephone Encounter (Signed)
I have sent in RX for levaquin. Tylenol '650mg'$  every 4 hours for fever. Should follow up wit MR or go to ED if not getting better

## 2022-07-11 MED ORDER — PROMETHAZINE-DM 6.25-15 MG/5ML PO SYRP: 5.0000 mL | ORAL_SOLUTION | Freq: Four times a day (QID) | ORAL | 0 refills | Status: DC | PRN

## 2022-07-11 NOTE — Telephone Encounter (Signed)
Spoke with pt and informed that promethazine-DM was called in and instructed her to stop Delsym. Pt stated understanding. Nothing further needed at this time.

## 2022-07-11 NOTE — Telephone Encounter (Signed)
Stop Delsym. I sent in promethazine-DM 104m every 6 hours.

## 2022-07-17 ENCOUNTER — Emergency Department (HOSPITAL_COMMUNITY): Payer: Medicare HMO

## 2022-07-17 ENCOUNTER — Other Ambulatory Visit: Payer: Self-pay

## 2022-07-17 ENCOUNTER — Encounter (HOSPITAL_COMMUNITY): Payer: Self-pay

## 2022-07-17 ENCOUNTER — Inpatient Hospital Stay (HOSPITAL_COMMUNITY)
Admission: EM | Admit: 2022-07-17 | Discharge: 2022-07-19 | DRG: 194 | Disposition: A | Discharge status: Home / Self Care | Payer: Medicare HMO | Attending: Family Medicine | Admitting: Family Medicine

## 2022-07-17 DIAGNOSIS — J329 Chronic sinusitis, unspecified: Secondary | ICD-10-CM

## 2022-07-17 DIAGNOSIS — K219 Gastro-esophageal reflux disease without esophagitis: Secondary | ICD-10-CM | POA: Diagnosis present

## 2022-07-17 DIAGNOSIS — E785 Hyperlipidemia, unspecified: Secondary | ICD-10-CM | POA: Diagnosis present

## 2022-07-17 DIAGNOSIS — E7800 Pure hypercholesterolemia, unspecified: Secondary | ICD-10-CM | POA: Diagnosis present

## 2022-07-17 DIAGNOSIS — J189 Pneumonia, unspecified organism: Secondary | ICD-10-CM | POA: Diagnosis not present

## 2022-07-17 DIAGNOSIS — Z8249 Family history of ischemic heart disease and other diseases of the circulatory system: Secondary | ICD-10-CM

## 2022-07-17 DIAGNOSIS — Z87891 Personal history of nicotine dependence: Secondary | ICD-10-CM

## 2022-07-17 DIAGNOSIS — Z1152 Encounter for screening for COVID-19: Secondary | ICD-10-CM

## 2022-07-17 DIAGNOSIS — J8489 Other specified interstitial pulmonary diseases: Secondary | ICD-10-CM | POA: Diagnosis present

## 2022-07-17 DIAGNOSIS — Z79899 Other long term (current) drug therapy: Secondary | ICD-10-CM

## 2022-07-17 DIAGNOSIS — Z96652 Presence of left artificial knee joint: Secondary | ICD-10-CM | POA: Diagnosis present

## 2022-07-17 DIAGNOSIS — Z7951 Long term (current) use of inhaled steroids: Secondary | ICD-10-CM

## 2022-07-17 DIAGNOSIS — I10 Essential (primary) hypertension: Secondary | ICD-10-CM | POA: Diagnosis present

## 2022-07-17 DIAGNOSIS — Z882 Allergy status to sulfonamides status: Secondary | ICD-10-CM

## 2022-07-17 DIAGNOSIS — Z8673 Personal history of transient ischemic attack (TIA), and cerebral infarction without residual deficits: Secondary | ICD-10-CM

## 2022-07-17 DIAGNOSIS — Z96653 Presence of artificial knee joint, bilateral: Secondary | ICD-10-CM | POA: Diagnosis present

## 2022-07-17 DIAGNOSIS — Z7982 Long term (current) use of aspirin: Secondary | ICD-10-CM

## 2022-07-17 DIAGNOSIS — F419 Anxiety disorder, unspecified: Secondary | ICD-10-CM | POA: Diagnosis present

## 2022-07-17 DIAGNOSIS — J9610 Chronic respiratory failure, unspecified whether with hypoxia or hypercapnia: Secondary | ICD-10-CM | POA: Diagnosis present

## 2022-07-17 DIAGNOSIS — G4730 Sleep apnea, unspecified: Secondary | ICD-10-CM | POA: Diagnosis present

## 2022-07-17 DIAGNOSIS — F32A Depression, unspecified: Secondary | ICD-10-CM | POA: Diagnosis present

## 2022-07-17 DIAGNOSIS — Z9049 Acquired absence of other specified parts of digestive tract: Secondary | ICD-10-CM

## 2022-07-17 LAB — COMPREHENSIVE METABOLIC PANEL
ALT: 29 U/L (ref 0–44)
AST: 31 U/L (ref 15–41)
Albumin: 3 g/dL — ABNORMAL LOW (ref 3.5–5.0)
Alkaline Phosphatase: 111 U/L (ref 38–126)
Anion gap: 11 (ref 5–15)
BUN: 19 mg/dL (ref 8–23)
CO2: 25 mmol/L (ref 22–32)
Calcium: 9.5 mg/dL (ref 8.9–10.3)
Chloride: 99 mmol/L (ref 98–111)
Creatinine, Ser: 0.75 mg/dL (ref 0.44–1.00)
GFR, Estimated: >60 mL/min (ref >60)
Glucose, Bld: 125 mg/dL — ABNORMAL HIGH (ref 70–99)
Potassium: 3.8 mmol/L (ref 3.5–5.1)
Sodium: 135 mmol/L (ref 135–145)
Total Bilirubin: 0.4 mg/dL (ref 0.3–1.2)
Total Protein: 8.2 g/dL — ABNORMAL HIGH (ref 6.5–8.1)

## 2022-07-17 LAB — CBC WITH DIFFERENTIAL/PLATELET
Abs Immature Granulocytes: 0.1 10*3/uL — ABNORMAL HIGH (ref 0.00–0.07)
Basophils Absolute: 0 10*3/uL (ref 0.0–0.1)
Basophils Relative: 0 %
Eosinophils Absolute: 0.2 10*3/uL (ref 0.0–0.5)
Eosinophils Relative: 2 %
HCT: 34.1 % — ABNORMAL LOW (ref 36.0–46.0)
Hemoglobin: 10.9 g/dL — ABNORMAL LOW (ref 12.0–15.0)
Immature Granulocytes: 1 %
Lymphocytes Relative: 15 %
Lymphs Abs: 1.9 10*3/uL (ref 0.7–4.0)
MCH: 29.1 pg (ref 26.0–34.0)
MCHC: 32 g/dL (ref 30.0–36.0)
MCV: 91.2 fL (ref 80.0–100.0)
Monocytes Absolute: 0.8 10*3/uL (ref 0.1–1.0)
Monocytes Relative: 7 %
Neutro Abs: 9.4 10*3/uL — ABNORMAL HIGH (ref 1.7–7.7)
Neutrophils Relative %: 75 %
Platelets: 358 10*3/uL (ref 150–400)
RBC: 3.74 MIL/uL — ABNORMAL LOW (ref 3.87–5.11)
RDW: 12.7 % (ref 11.5–15.5)
WBC: 12.4 10*3/uL — ABNORMAL HIGH (ref 4.0–10.5)
nRBC: 0 % (ref 0.0–0.2)

## 2022-07-17 LAB — RESP PANEL BY RT-PCR (RSV, FLU A&B, COVID)  RVPGX2
Influenza A by PCR: NEGATIVE
Influenza B by PCR: NEGATIVE
Resp Syncytial Virus by PCR: NEGATIVE
SARS Coronavirus 2 by RT PCR: NEGATIVE

## 2022-07-17 LAB — LACTIC ACID, PLASMA: Lactic Acid, Venous: 1.3 mmol/L (ref 0.5–1.9)

## 2022-07-17 MED ORDER — ACETAMINOPHEN 650 MG RE SUPP: 650.0000 mg | Freq: Four times a day (QID) | RECTAL | Status: DC | PRN

## 2022-07-17 MED ORDER — ALBUTEROL SULFATE (2.5 MG/3ML) 0.083% IN NEBU: 2.5000 mg | INHALATION_SOLUTION | Freq: Four times a day (QID) | RESPIRATORY_TRACT | Status: DC | PRN

## 2022-07-17 MED ORDER — MORPHINE SULFATE (PF) 2 MG/ML IV SOLN: 2.0000 mg | INTRAVENOUS | Status: DC | PRN

## 2022-07-17 MED ORDER — ASPIRIN 325 MG PO TABS
325.0000 mg | ORAL_TABLET | Freq: Every day | ORAL | Status: DC
  Administered 2022-07-17 – 2022-07-18 (×2): 325 mg via ORAL
  Filled 2022-07-17 (×2): qty 1

## 2022-07-17 MED ORDER — SODIUM CHLORIDE 0.9 % IV SOLN
1.0000 g | INTRAVENOUS | Status: DC
  Administered 2022-07-17: 1 g via INTRAVENOUS
  Filled 2022-07-17: qty 10

## 2022-07-17 MED ORDER — ONDANSETRON HCL 4 MG/2ML IJ SOLN
4.0000 mg | Freq: Four times a day (QID) | INTRAMUSCULAR | Status: DC | PRN
  Administered 2022-07-18: 4 mg via INTRAVENOUS
  Filled 2022-07-17: qty 2

## 2022-07-17 MED ORDER — IRBESARTAN 150 MG PO TABS
300.0000 mg | ORAL_TABLET | Freq: Every day | ORAL | Status: DC
  Administered 2022-07-18 – 2022-07-19 (×2): 300 mg via ORAL
  Filled 2022-07-17 (×2): qty 2

## 2022-07-17 MED ORDER — HEPARIN SODIUM (PORCINE) 5000 UNIT/ML IJ SOLN
5000.0000 [IU] | Freq: Three times a day (TID) | INTRAMUSCULAR | Status: DC
  Administered 2022-07-18 – 2022-07-19 (×3): 5000 [IU] via SUBCUTANEOUS
  Filled 2022-07-17 (×3): qty 1

## 2022-07-17 MED ORDER — HYDROCHLOROTHIAZIDE 12.5 MG PO TABS
12.5000 mg | ORAL_TABLET | Freq: Every day | ORAL | Status: DC
  Administered 2022-07-18 – 2022-07-19 (×2): 12.5 mg via ORAL
  Filled 2022-07-17 (×2): qty 1

## 2022-07-17 MED ORDER — SODIUM CHLORIDE 0.9 % IV SOLN
500.0000 mg | INTRAVENOUS | Status: DC
  Administered 2022-07-17: 500 mg via INTRAVENOUS
  Filled 2022-07-17: qty 5

## 2022-07-17 MED ORDER — HEPARIN SODIUM (PORCINE) 5000 UNIT/ML IJ SOLN
5000.0000 [IU] | Freq: Three times a day (TID) | INTRAMUSCULAR | Status: DC
  Filled 2022-07-17: qty 1

## 2022-07-17 MED ORDER — OXYCODONE HCL 5 MG PO TABS
5.0000 mg | ORAL_TABLET | ORAL | Status: DC | PRN
  Administered 2022-07-18: 5 mg via ORAL
  Filled 2022-07-17: qty 1

## 2022-07-17 MED ORDER — ADULT MULTIVITAMIN W/MINERALS CH
1.0000 | ORAL_TABLET | Freq: Every day | ORAL | Status: DC
  Administered 2022-07-18 – 2022-07-19 (×2): 1 via ORAL
  Filled 2022-07-17 (×2): qty 1

## 2022-07-17 MED ORDER — SODIUM CHLORIDE 0.9 % IV BOLUS
500.0000 mL | Freq: Once | INTRAVENOUS | Status: AC
  Administered 2022-07-17: 500 mL via INTRAVENOUS

## 2022-07-17 MED ORDER — FLUOXETINE HCL 20 MG PO CAPS
80.0000 mg | ORAL_CAPSULE | Freq: Every day | ORAL | Status: DC
  Administered 2022-07-18 – 2022-07-19 (×2): 80 mg via ORAL
  Filled 2022-07-17 (×2): qty 4

## 2022-07-17 MED ORDER — AMLODIPINE BESYLATE 5 MG PO TABS: 5.0000 mg | ORAL_TABLET | Freq: Every day | ORAL | Status: DC

## 2022-07-17 MED ORDER — ACETAMINOPHEN 325 MG PO TABS: 650.0000 mg | ORAL_TABLET | Freq: Four times a day (QID) | ORAL | Status: DC | PRN

## 2022-07-17 MED ORDER — ROFLUMILAST 250 MCG PO TABS: 1.0000 | ORAL_TABLET | Freq: Every day | ORAL | Status: DC

## 2022-07-17 MED ORDER — AMLODIPINE BESYLATE 5 MG PO TABS
5.0000 mg | ORAL_TABLET | Freq: Every day | ORAL | Status: DC
  Administered 2022-07-17 – 2022-07-18 (×2): 5 mg via ORAL
  Filled 2022-07-17 (×2): qty 1

## 2022-07-17 MED ORDER — ACETAMINOPHEN 325 MG PO TABS
650.0000 mg | ORAL_TABLET | Freq: Once | ORAL | Status: AC
  Administered 2022-07-17: 650 mg via ORAL
  Filled 2022-07-17: qty 2

## 2022-07-17 MED ORDER — BUDESON-GLYCOPYRROL-FORMOTEROL 160-9-4.8 MCG/ACT IN AERO: 2.0000 | INHALATION_SPRAY | Freq: Two times a day (BID) | RESPIRATORY_TRACT | Status: DC

## 2022-07-17 MED ORDER — ONDANSETRON HCL 4 MG PO TABS: 4.0000 mg | ORAL_TABLET | Freq: Four times a day (QID) | ORAL | Status: DC | PRN

## 2022-07-17 MED ORDER — ALPRAZOLAM 0.5 MG PO TABS
0.5000 mg | ORAL_TABLET | Freq: Two times a day (BID) | ORAL | Status: DC | PRN
  Administered 2022-07-18: 0.5 mg via ORAL
  Filled 2022-07-17: qty 1

## 2022-07-17 MED ORDER — EZETIMIBE 10 MG PO TABS
10.0000 mg | ORAL_TABLET | Freq: Every evening | ORAL | Status: DC
  Administered 2022-07-17 – 2022-07-18 (×2): 10 mg via ORAL
  Filled 2022-07-17 (×2): qty 1

## 2022-07-17 MED ORDER — ATORVASTATIN CALCIUM 40 MG PO TABS
80.0000 mg | ORAL_TABLET | Freq: Every day | ORAL | Status: DC
  Administered 2022-07-18 – 2022-07-19 (×2): 80 mg via ORAL
  Filled 2022-07-17 (×2): qty 2

## 2022-07-17 MED ORDER — METOPROLOL TARTRATE 25 MG PO TABS
25.0000 mg | ORAL_TABLET | Freq: Two times a day (BID) | ORAL | Status: DC
  Administered 2022-07-17 – 2022-07-19 (×4): 25 mg via ORAL
  Filled 2022-07-17 (×4): qty 1

## 2022-07-17 MED ORDER — ROFLUMILAST 500 MCG PO TABS: 500.0000 ug | ORAL_TABLET | Freq: Every day | ORAL | Status: DC

## 2022-07-17 MED ORDER — IRBESARTAN-HYDROCHLOROTHIAZIDE 300-12.5 MG PO TABS: 1.0000 | ORAL_TABLET | Freq: Every day | ORAL | Status: DC

## 2022-07-17 MED ORDER — BENZONATATE 100 MG PO CAPS
200.0000 mg | ORAL_CAPSULE | Freq: Three times a day (TID) | ORAL | Status: DC | PRN
  Administered 2022-07-18: 200 mg via ORAL
  Filled 2022-07-17: qty 2

## 2022-07-17 NOTE — ED Provider Notes (Signed)
[facility] Provider Note   CSN: [id number] Arrival date & time: 07/17/22  1830     History  Chief Complaint  Patient presents with   Cough    Jenna Hancock is a 62 y.o. female.   Cough Patient is had a cough for around 6 weeks now.  Had bronchitis 2 weeks ago.  Has been on different antibiotics by pulmonary.  States now more short of breath.  Now having fevers.  Increasing coughing.  Has sputum production.  States fevers up to 102.  More fatigue.  Sats will go down in the 80s at home.  Has oxygen as needed at home.  Has had previous admissions after episodes like this that showed pneumonia.    Past Medical History:  Diagnosis Date   Anxiety    Arthritis    Depression    Essential hypertension, benign 12/08/2016   GERD (gastroesophageal reflux disease)    Heart murmur    AS A CHILD    High cholesterol 12/08/2016   Hyperlipidemia    Hypertension    PONV (postoperative nausea and vomiting)    Recurrent upper respiratory infection (URI)    Sleep apnea    DOES NOT USE CPAP    Stroke ([place])    HX OF MINI STROKE 2016    TIA (transient ischemic attack) 2017    Home Medications Prior to Admission medications   Medication Sig Start Date End Date Taking? Authorizing Provider  albuterol (PROVENTIL) (2.5 MG/3ML) 0.083% nebulizer solution Take 3 mLs (2.5 mg total) by nebulization every 6 (six) hours as needed for wheezing or shortness of breath. 10/12/21  Yes Gherghe, Vella Redhead, MD  ALPRAZolam Duanne Moron) 0.5 MG tablet Take 0.5 mg by mouth 2 (two) times daily as needed for anxiety.    Yes [provider]  amLODipine (NORVASC) 5 MG tablet Take 5 mg by mouth daily.   Yes [provider]  aspirin 325 MG tablet Take 325 mg by mouth at bedtime.   Yes [provider]  atorvastatin (LIPITOR) 80 MG tablet Take 80 mg by mouth daily.   Yes [provider]  azelastine (ASTELIN) 0.1 % nasal spray Place 1 spray into  both nostrils 2 (two) times daily. Use in each nostril as directed 06/23/22  Yes Patel, Odette Horns, MD  benzonatate (TESSALON) 200 MG capsule Take 1 capsule (200 mg total) by mouth 3 (three) times daily as needed for cough. 06/05/22  Yes Brand Males, MD  Budeson-Glycopyrrol-Formoterol (BREZTRI AEROSPHERE) 160-9-4.8 MCG/ACT AERO Inhale 2 puffs into the lungs in the morning and at bedtime. 03/27/22  Yes Brand Males, MD  cetirizine (ZYRTEC ALLERGY) 10 MG tablet Take 1 tablet (10 mg total) by mouth daily as needed for allergies. 06/23/22  Yes Larose Kells, MD  ezetimibe (ZETIA) 10 MG tablet Take 10 mg by mouth every evening. 11/24/19  Yes [provider]  FLUoxetine (PROZAC) 40 MG capsule Take 80 mg by mouth daily. 12/20/19  Yes [provider]  fluticasone (FLONASE) 50 MCG/ACT nasal spray Place 2 sprays into both nostrils daily. 06/23/22  Yes Larose Kells, MD  folic acid (FOLVITE) 010 MCG tablet Take 800 mcg by mouth daily.   Yes [provider]  furosemide (LASIX) 20 MG tablet Take 20 mg by mouth daily. 05/01/22  Yes [provider]  HYDROcodone-acetaminophen (NORCO) 10-325 MG tablet Take 1 tablet by mouth in the morning and at bedtime.   Yes [provider]  irbesartan-hydrochlorothiazide (AVALIDE) 300-12.5 MG per tablet Take 1 tablet by mouth daily.   Yes [provider]  metoprolol tartrate (LOPRESSOR) 25 MG tablet Take 25 mg by mouth 2 (two) times daily. 11/15/19  Yes [provider]  Multiple Vitamins-Minerals (MULTIVITAMIN WITH MINERALS) tablet Take 1 tablet by mouth daily.   Yes [provider]  OVER THE COUNTER MEDICATION Take 1 capsule by mouth daily. Fish Oil   Yes [provider]  pantoprazole (PROTONIX) 40 MG tablet Take 30- 60 min before your first and last meals of the day Patient taking differently: Take 40 mg by mouth daily. 06/11/17  Yes Tanda Rockers, MD  promethazine-dextromethorphan  (PROMETHAZINE-DM) 6.25-15 MG/5ML syrup Take 5 mLs by mouth 4 (four) times daily as needed for cough. 07/11/22  Yes Martyn Ehrich, NP  Roflumilast (DALIRESP) 250 MCG TABS Take 1 tablet by mouth daily. 03/06/22  Yes Brand Males, MD  doxycycline (VIBRA-TABS) 100 MG tablet Take 1 tablet (100 mg total) by mouth 2 (two) times daily. Patient not taking: Reported on 07/17/2022 06/30/22   Martyn Ehrich, NP  ibuprofen (ADVIL) 800 MG tablet one p.o. q day prn Patient not taking: Reported on 07/17/2022 05/28/22   [provider]  levofloxacin (LEVAQUIN) 500 MG tablet Take 1 tablet (500 mg total) by mouth daily. Patient not taking: Reported on 07/17/2022 07/10/22   Martyn Ehrich, NP  minocycline (MINOCIN) 50 MG capsule Take 50 mg by mouth daily.    [provider]      Allergies    Bactrim and Sulfa antibiotics    Review of Systems   Review of Systems  Respiratory:  Positive for cough.     Physical Exam Updated Vital Signs BP (!) 151/62 (BP Location: Right Arm)   Pulse 84   Temp 100.3 F (37.9 C) (Oral)   Resp 18   Ht '5\' 4"'$  (1.626 m)   Wt 89.8 kg   SpO2 94%   BMI 33.99 kg/m  Physical Exam Vitals and nursing note reviewed.  HENT:     Head: Atraumatic.  Cardiovascular:     Rate and Rhythm: Regular rhythm.  Pulmonary:     Comments: Somewhat diffuse harsh breath sounds.  No focal rales or rhonchi. Abdominal:     Tenderness: There is no abdominal tenderness.  Musculoskeletal:        General: No tenderness.  Neurological:     Mental Status: She is alert.     ED Results / Procedures / Treatments   Labs (all labs ordered are listed, but only abnormal results are displayed) Labs Reviewed  COMPREHENSIVE METABOLIC PANEL - Abnormal; Notable for the following components:      Result Value   Glucose, Bld 125 (*)    Total Protein 8.2 (*)    Albumin 3.0 (*)    All other components within normal limits  CBC WITH DIFFERENTIAL/PLATELET - Abnormal; Notable for  the following components:   WBC 12.4 (*)    RBC 3.74 (*)    Hemoglobin 10.9 (*)    HCT 34.1 (*)    Neutro Abs 9.4 (*)    Abs Immature Granulocytes 0.10 (*)    All other components within normal limits  RESP PANEL BY RT-PCR (RSV, FLU A&B, COVID)  [id number]  LACTIC ACID, PLASMA    EKG None  Radiology CT Chest Wo Contrast  Result Date: 07/17/2022 CLINICAL DATA:  Fever, cough, abnormal chest radiograph EXAM: CT CHEST WITHOUT CONTRAST TECHNIQUE: Multidetector CT imaging of the  chest was performed following the standard protocol without IV contrast. RADIATION DOSE REDUCTION: This exam was performed according to the departmental dose-optimization program which includes automated exposure control, adjustment of the mA and/or kV according to patient size and/or use of iterative reconstruction technique. COMPARISON:  Chest radiographs dated 07/17/2022 and 07/01/2022. CT chest dated 11/27/2021. FINDINGS: Cardiovascular: The heart is normal in size. No pericardial effusion. No evidence of thoracic aortic aneurysm. Atherosclerotic calcifications of the aortic arch. Mild atherosclerosis of the LAD. Mediastinum/Nodes: Small mediastinal nodes, including 61 m short axis AP window node, likely reactive. Visualized thyroid is unremarkable. Lungs/Pleura: Multifocal patchy/ground-glass opacities, upper lung predominant, favoring multifocal pneumonia in this clinical setting. In the absence of infectious symptoms, inflammatory etiologies such as hypersensitivity pneumonitis could have a similar appearance. No suspicious pulmonary nodules. No pleural effusion or pneumothorax. Upper Abdomen: Visualized upper abdomen is notable for cholelithiasis (series 2/image 163), without associated phlegm a tori changes. Musculoskeletal: Degenerative changes of the visualized thoracolumbar spine. IMPRESSION: Multifocal patchy/ground-glass opacities, upper lung predominant, favoring multifocal pneumonia in this clinical setting. In the  absence of infectious symptoms, inflammatory etiologies such as hypersensitivity pneumonitis could have a similar appearance. Mild mediastinal lymphadenopathy, likely reactive. Cholelithiasis, without associated inflammatory changes. Electronically Signed   By: Julian Hy M.D.   On: 07/17/2022 21:15   DG Chest 2 View  Result Date: 07/17/2022 CLINICAL DATA:  Cough and chills EXAM: CHEST - 2 VIEW COMPARISON:  Chest x-ray 07/01/2022 FINDINGS: There are vague patchy multifocal airspace opacities in the mid and lower lungs, left greater than right. No pleural effusion or pneumothorax. Cardiomediastinal silhouette is within normal limits. No acute fractures are seen. IMPRESSION: Vague patchy multifocal airspace opacities in the mid and lower lungs, left greater than right, concerning for multifocal pneumonia. Electronically Signed   By: Ronney Asters M.D.   On: 07/17/2022 19:53    Procedures Procedures    Medications Ordered in ED Medications  sodium chloride 0.9 % bolus 500 mL (0 mLs Intravenous Stopped 07/17/22 2103)    ED Course/ Medical Decision Making/ A&P                             Medical Decision Making Amount and/or Complexity of Data Reviewed Labs: ordered. Radiology: ordered.   Patient shortness of breath and cough.  Is had over the last 6 weeks.  Does have some chronic lung disease.  Previous pneumonias.  Bronchitis.  Has been on antibiotics.  Worsening symptoms.  Will get x-ray to evaluate for pneumonia.  X-ray independently interpreted does show new multifocal pneumonia.  Blood work done and overall reassuring.  Mild leukocytosis.  Will get CT scan to further evaluate the lungs. I reviewed recent pulmonary notes.  CT scan also shows potential multifocal pneumonia.  With the fever and infectious symptoms I think this is likely infection.  Has been on oral antibiotics and had worsening symptoms.  Negative flu and COVID testing.  Does have sats that we will go down into the 80s.   I think [facility] antibiotics and pulmonary consult.  Will discuss with hospitalist.        Final Clinical Impression(s) / ED Diagnoses Final diagnoses:  Community acquired pneumonia, unspecified laterality    Rx / DC Orders ED Discharge Orders     None         Davonna Belling, MD 07/17/22 2159

## 2022-07-17 NOTE — ED Triage Notes (Signed)
Pt reports fever, productive cough x 4 weeks.  Reports she had a CXR that showed bronchitis 2 weeks ago and has been on 4 different abx and feels no better.

## 2022-07-18 ENCOUNTER — Ambulatory Visit: Payer: Medicare HMO

## 2022-07-18 ENCOUNTER — Observation Stay (HOSPITAL_COMMUNITY): Payer: Medicare HMO

## 2022-07-18 DIAGNOSIS — Z7982 Long term (current) use of aspirin: Secondary | ICD-10-CM | POA: Diagnosis not present

## 2022-07-18 DIAGNOSIS — J0141 Acute recurrent pansinusitis: Secondary | ICD-10-CM

## 2022-07-18 DIAGNOSIS — Z7951 Long term (current) use of inhaled steroids: Secondary | ICD-10-CM | POA: Diagnosis not present

## 2022-07-18 DIAGNOSIS — I10 Essential (primary) hypertension: Secondary | ICD-10-CM

## 2022-07-18 DIAGNOSIS — J8489 Other specified interstitial pulmonary diseases: Secondary | ICD-10-CM | POA: Diagnosis present

## 2022-07-18 DIAGNOSIS — Z9049 Acquired absence of other specified parts of digestive tract: Secondary | ICD-10-CM | POA: Diagnosis not present

## 2022-07-18 DIAGNOSIS — J9621 Acute and chronic respiratory failure with hypoxia: Secondary | ICD-10-CM | POA: Diagnosis not present

## 2022-07-18 DIAGNOSIS — E7800 Pure hypercholesterolemia, unspecified: Secondary | ICD-10-CM

## 2022-07-18 DIAGNOSIS — F32A Depression, unspecified: Secondary | ICD-10-CM | POA: Diagnosis present

## 2022-07-18 DIAGNOSIS — Z79899 Other long term (current) drug therapy: Secondary | ICD-10-CM | POA: Diagnosis not present

## 2022-07-18 DIAGNOSIS — K219 Gastro-esophageal reflux disease without esophagitis: Secondary | ICD-10-CM | POA: Diagnosis present

## 2022-07-18 DIAGNOSIS — J4541 Moderate persistent asthma with (acute) exacerbation: Secondary | ICD-10-CM

## 2022-07-18 DIAGNOSIS — F419 Anxiety disorder, unspecified: Secondary | ICD-10-CM | POA: Diagnosis present

## 2022-07-18 DIAGNOSIS — J9611 Chronic respiratory failure with hypoxia: Secondary | ICD-10-CM

## 2022-07-18 DIAGNOSIS — Z1152 Encounter for screening for COVID-19: Secondary | ICD-10-CM | POA: Diagnosis not present

## 2022-07-18 DIAGNOSIS — Z96653 Presence of artificial knee joint, bilateral: Secondary | ICD-10-CM | POA: Diagnosis present

## 2022-07-18 DIAGNOSIS — J189 Pneumonia, unspecified organism: Secondary | ICD-10-CM

## 2022-07-18 DIAGNOSIS — Z8249 Family history of ischemic heart disease and other diseases of the circulatory system: Secondary | ICD-10-CM | POA: Diagnosis not present

## 2022-07-18 DIAGNOSIS — Z96652 Presence of left artificial knee joint: Secondary | ICD-10-CM | POA: Diagnosis present

## 2022-07-18 DIAGNOSIS — Z87891 Personal history of nicotine dependence: Secondary | ICD-10-CM | POA: Diagnosis not present

## 2022-07-18 DIAGNOSIS — G4730 Sleep apnea, unspecified: Secondary | ICD-10-CM | POA: Diagnosis present

## 2022-07-18 DIAGNOSIS — Z8673 Personal history of transient ischemic attack (TIA), and cerebral infarction without residual deficits: Secondary | ICD-10-CM | POA: Diagnosis not present

## 2022-07-18 DIAGNOSIS — Z882 Allergy status to sulfonamides status: Secondary | ICD-10-CM | POA: Diagnosis not present

## 2022-07-18 DIAGNOSIS — J9610 Chronic respiratory failure, unspecified whether with hypoxia or hypercapnia: Secondary | ICD-10-CM | POA: Diagnosis present

## 2022-07-18 LAB — CBC WITH DIFFERENTIAL/PLATELET
Abs Immature Granulocytes: 0.08 10*3/uL — ABNORMAL HIGH (ref 0.00–0.07)
Basophils Absolute: 0 10*3/uL (ref 0.0–0.1)
Basophils Relative: 0 %
Eosinophils Absolute: 0.2 10*3/uL (ref 0.0–0.5)
Eosinophils Relative: 2 %
HCT: 33.8 % — ABNORMAL LOW (ref 36.0–46.0)
Hemoglobin: 10.5 g/dL — ABNORMAL LOW (ref 12.0–15.0)
Immature Granulocytes: 1 %
Lymphocytes Relative: 23 %
Lymphs Abs: 1.9 10*3/uL (ref 0.7–4.0)
MCH: 28.5 pg (ref 26.0–34.0)
MCHC: 31.1 g/dL (ref 30.0–36.0)
MCV: 91.8 fL (ref 80.0–100.0)
Monocytes Absolute: 0.7 10*3/uL (ref 0.1–1.0)
Monocytes Relative: 9 %
Neutro Abs: 5.3 10*3/uL (ref 1.7–7.7)
Neutrophils Relative %: 65 %
Platelets: 322 10*3/uL (ref 150–400)
RBC: 3.68 MIL/uL — ABNORMAL LOW (ref 3.87–5.11)
RDW: 12.5 % (ref 11.5–15.5)
WBC: 8.2 10*3/uL (ref 4.0–10.5)
nRBC: 0 % (ref 0.0–0.2)

## 2022-07-18 LAB — MAGNESIUM: Magnesium: 2 mg/dL (ref 1.7–2.4)

## 2022-07-18 LAB — RESPIRATORY PANEL BY PCR

## 2022-07-18 LAB — COMPREHENSIVE METABOLIC PANEL
ALT: 24 U/L (ref 0–44)
AST: 23 U/L (ref 15–41)
Albumin: 2.7 g/dL — ABNORMAL LOW (ref 3.5–5.0)
Alkaline Phosphatase: 89 U/L (ref 38–126)
Anion gap: 6 (ref 5–15)
BUN: 14 mg/dL (ref 8–23)
CO2: 23 mmol/L (ref 22–32)
Calcium: 8.9 mg/dL (ref 8.9–10.3)
Chloride: 106 mmol/L (ref 98–111)
Creatinine, Ser: 0.48 mg/dL (ref 0.44–1.00)
GFR, Estimated: >60 mL/min (ref >60)
Glucose, Bld: 89 mg/dL (ref 70–99)
Potassium: 3.6 mmol/L (ref 3.5–5.1)
Sodium: 135 mmol/L (ref 135–145)
Total Bilirubin: 0.4 mg/dL (ref 0.3–1.2)
Total Protein: 7.3 g/dL (ref 6.5–8.1)

## 2022-07-18 LAB — PROCALCITONIN: Procalcitonin: <0.1 ng/mL

## 2022-07-18 LAB — SEDIMENTATION RATE: Sed Rate: 120 mm/hr — ABNORMAL HIGH (ref 0–22)

## 2022-07-18 MED ORDER — UMECLIDINIUM BROMIDE 62.5 MCG/ACT IN AEPB: 1.0000 | INHALATION_SPRAY | Freq: Every day | RESPIRATORY_TRACT | Status: DC

## 2022-07-18 MED ORDER — FLUTICASONE FUROATE-VILANTEROL 200-25 MCG/ACT IN AEPB
1.0000 | INHALATION_SPRAY | Freq: Every day | RESPIRATORY_TRACT | Status: DC
  Administered 2022-07-18: 1 via RESPIRATORY_TRACT
  Filled 2022-07-18: qty 28

## 2022-07-18 MED ORDER — PANTOPRAZOLE SODIUM 40 MG PO TBEC
40.0000 mg | DELAYED_RELEASE_TABLET | Freq: Two times a day (BID) | ORAL | Status: DC
  Administered 2022-07-18 – 2022-07-19 (×2): 40 mg via ORAL
  Filled 2022-07-18 (×2): qty 1

## 2022-07-18 MED ORDER — IPRATROPIUM-ALBUTEROL 0.5-2.5 (3) MG/3ML IN SOLN
3.0000 mL | Freq: Four times a day (QID) | RESPIRATORY_TRACT | Status: DC
  Administered 2022-07-18 – 2022-07-19 (×2): 3 mL via RESPIRATORY_TRACT
  Filled 2022-07-18 (×3): qty 3

## 2022-07-18 MED ORDER — AMOXICILLIN-POT CLAVULANATE 875-125 MG PO TABS: 1.0000 | ORAL_TABLET | Freq: Two times a day (BID) | ORAL | Status: DC

## 2022-07-18 MED ORDER — MORPHINE SULFATE (PF) 2 MG/ML IV SOLN: 1.0000 mg | INTRAVENOUS | Status: DC | PRN

## 2022-07-18 MED ORDER — DM-GUAIFENESIN ER 30-600 MG PO TB12
2.0000 | ORAL_TABLET | Freq: Two times a day (BID) | ORAL | Status: DC
  Administered 2022-07-18 – 2022-07-19 (×3): 2 via ORAL
  Filled 2022-07-18 (×3): qty 2

## 2022-07-18 MED ORDER — IPRATROPIUM-ALBUTEROL 0.5-2.5 (3) MG/3ML IN SOLN
3.0000 mL | Freq: Four times a day (QID) | RESPIRATORY_TRACT | Status: DC
  Administered 2022-07-18 (×2): 3 mL via RESPIRATORY_TRACT
  Filled 2022-07-18 (×2): qty 3

## 2022-07-18 MED ORDER — METHYLPREDNISOLONE SODIUM SUCC 125 MG IJ SOLR
80.0000 mg | Freq: Two times a day (BID) | INTRAMUSCULAR | Status: DC
  Administered 2022-07-18 – 2022-07-19 (×3): 80 mg via INTRAVENOUS
  Filled 2022-07-18 (×3): qty 2

## 2022-07-18 MED ORDER — AMOXICILLIN-POT CLAVULANATE 875-125 MG PO TABS
1.0000 | ORAL_TABLET | Freq: Two times a day (BID) | ORAL | Status: DC
  Administered 2022-07-18 – 2022-07-19 (×3): 1 via ORAL
  Filled 2022-07-18 (×3): qty 1

## 2022-07-18 NOTE — Assessment & Plan Note (Signed)
-   Continue statin and Zetia

## 2022-07-18 NOTE — TOC Progression Note (Signed)
Transition of Care [facility]) - Progression Note    Patient Details  Name: Jenna Hancock MRN: [id number] Date of Birth: 06-30-1960  Transition of Care [facility]) CM/SW Contact  Salome Arnt, [place] Phone Number: 07/18/2022, 10:19 AM  Clinical Narrative:   Transition of Care [facility]) Screening Note   Patient Details  Name: Jenna Hancock Date of Birth: 1960/11/17   Transition of Care [facility]) CM/SW Contact:    Salome Arnt, LCSW Phone Number: 07/18/2022, 10:19 AM    Transition of Care Department [facility]) has reviewed patient and no TOC needs have been identified at this time. We will continue to monitor patient advancement through interdisciplinary progression rounds. If new patient transition needs arise, please place a TOC consult.            Expected Discharge Plan and Services                                               Social Determinants of Health (SDOH) Interventions SDOH Screenings   Food Insecurity: No Food Insecurity (07/17/2022)  Housing: Low Risk  (07/17/2022)  Transportation Needs: No Transportation Needs (07/17/2022)  Utilities: Not At Risk (07/17/2022)  Tobacco Use: Medium Risk (07/17/2022)    Readmission Risk Interventions    02/02/2020    3:13 PM  Readmission Risk Prevention Plan  Post Dischage Appt Complete  Medication Screening Complete  Transportation Screening Complete

## 2022-07-18 NOTE — Assessment & Plan Note (Signed)
-   Multifocal pneumonia shown on imaging - Has been on antibiotics in the outpatient setting including doxycycline, Zithromax, Levaquin - Blood cultures pending - Sputum culture pending - Legionella and strep antigens pending - Continue supportive care with Tessalon Perles and breathing treatments - Continue to monitor

## 2022-07-18 NOTE — Progress Notes (Signed)
ASSUMPTION OF CARE NOTE   07/18/2022 2:19 PM  Jenna Hancock was seen and examined.  The H&P by the admitting provider, orders, imaging was reviewed.  Please see new orders.  Will continue to follow.   Vitals:   07/18/22 0604 07/18/22 0805  BP: (!) 118/54   Pulse: 74   Resp:    Temp: 97.8 F (36.6 C)   SpO2: 98% 97%    Results for orders placed or performed during the hospital encounter of 07/17/22  Resp panel by RT-PCR (RSV, Flu A&B, Covid) Anterior Nasal Swab   Specimen: Anterior Nasal Swab  Result Value Ref Range   SARS Coronavirus 2 by RT PCR NEGATIVE NEGATIVE   Influenza A by PCR NEGATIVE NEGATIVE   Influenza B by PCR NEGATIVE NEGATIVE   Resp Syncytial Virus by PCR NEGATIVE NEGATIVE  Culture, blood (Routine X 2) w Reflex to ID Panel   Specimen: BLOOD  Result Value Ref Range   Specimen Description BLOOD BLOOD RIGHT HAND    Special Requests      BOTTLES DRAWN AEROBIC AND ANAEROBIC Blood Culture adequate volume   Culture      NO GROWTH < 12 HOURS Performed at [facility], [address] [place], [place] [address]    Report Status PENDING   Culture, blood (Routine X 2) w Reflex to ID Panel   Specimen: BLOOD  Result Value Ref Range   Specimen Description BLOOD LEFT ANTECUBITAL    Special Requests      BOTTLES DRAWN AEROBIC AND ANAEROBIC Blood Culture adequate volume   Culture      NO GROWTH < 12 HOURS Performed at [facility], [address], [place], [place] [address]    Report Status PENDING   Comprehensive metabolic panel  Result Value Ref Range   Sodium 135 135 - 145 mmol/L   Potassium 3.8 3.5 - 5.1 mmol/L   Chloride 99 98 - 111 mmol/L   CO2 25 22 - 32 mmol/L   Glucose, Bld 125 (H) 70 - 99 mg/dL   BUN 19 8 - 23 mg/dL   Creatinine, Ser 0.75 0.44 - 1.00 mg/dL   Calcium 9.5 8.9 - 10.3 mg/dL   Total Protein 8.2 (H) 6.5 - 8.1 g/dL   Albumin 3.0 (L) 3.5 - 5.0 g/dL   AST 31 15 - 41 U/L   ALT 29 0 - 44 U/L   Alkaline Phosphatase 111 38 - 126 U/L   Total  Bilirubin 0.4 0.3 - 1.2 mg/dL   GFR, Estimated >60 >60 mL/min   Anion gap 11 5 - 15  CBC with Differential  Result Value Ref Range   WBC 12.4 (H) 4.0 - 10.5 K/uL   RBC 3.74 (L) 3.87 - 5.11 MIL/uL   Hemoglobin 10.9 (L) 12.0 - 15.0 g/dL   HCT 34.1 (L) 36.0 - 46.0 %   MCV 91.2 80.0 - 100.0 fL   MCH 29.1 26.0 - 34.0 pg   MCHC 32.0 30.0 - 36.0 g/dL   RDW 12.7 11.5 - 15.5 %   Platelets 358 150 - 400 K/uL   nRBC 0.0 0.0 - 0.2 %   Neutrophils Relative % 75 %   Neutro Abs 9.4 (H) 1.7 - 7.7 K/uL   Lymphocytes Relative 15 %   Lymphs Abs 1.9 0.7 - 4.0 K/uL   Monocytes Relative 7 %   Monocytes Absolute 0.8 0.1 - 1.0 K/uL   Eosinophils Relative 2 %   Eosinophils Absolute 0.2 0.0 - 0.5 K/uL  Basophils Relative 0 %   Basophils Absolute 0.0 0.0 - 0.1 K/uL   Immature Granulocytes 1 %   Abs Immature Granulocytes 0.10 (H) 0.00 - 0.07 K/uL  Lactic acid, plasma  Result Value Ref Range   Lactic Acid, Venous 1.3 0.5 - 1.9 mmol/L  Procalcitonin - Baseline  Result Value Ref Range   Procalcitonin <0.10 ng/mL  Comprehensive metabolic panel  Result Value Ref Range   Sodium 135 135 - 145 mmol/L   Potassium 3.6 3.5 - 5.1 mmol/L   Chloride 106 98 - 111 mmol/L   CO2 23 22 - 32 mmol/L   Glucose, Bld 89 70 - 99 mg/dL   BUN 14 8 - 23 mg/dL   Creatinine, Ser 0.48 0.44 - 1.00 mg/dL   Calcium 8.9 8.9 - 10.3 mg/dL   Total Protein 7.3 6.5 - 8.1 g/dL   Albumin 2.7 (L) 3.5 - 5.0 g/dL   AST 23 15 - 41 U/L   ALT 24 0 - 44 U/L   Alkaline Phosphatase 89 38 - 126 U/L   Total Bilirubin 0.4 0.3 - 1.2 mg/dL   GFR, Estimated >60 >60 mL/min   Anion gap 6 5 - 15  Magnesium  Result Value Ref Range   Magnesium 2.0 1.7 - 2.4 mg/dL  CBC with Differential/Platelet  Result Value Ref Range   WBC 8.2 4.0 - 10.5 K/uL   RBC 3.68 (L) 3.87 - 5.11 MIL/uL   Hemoglobin 10.5 (L) 12.0 - 15.0 g/dL   HCT 33.8 (L) 36.0 - 46.0 %   MCV 91.8 80.0 - 100.0 fL   MCH 28.5 26.0 - 34.0 pg   MCHC 31.1 30.0 - 36.0 g/dL   RDW 12.5 11.5 -  15.5 %   Platelets 322 150 - 400 K/uL   nRBC 0.0 0.0 - 0.2 %   Neutrophils Relative % 65 %   Neutro Abs 5.3 1.7 - 7.7 K/uL   Lymphocytes Relative 23 %   Lymphs Abs 1.9 0.7 - 4.0 K/uL   Monocytes Relative 9 %   Monocytes Absolute 0.7 0.1 - 1.0 K/uL   Eosinophils Relative 2 %   Eosinophils Absolute 0.2 0.0 - 0.5 K/uL   Basophils Relative 0 %   Basophils Absolute 0.0 0.0 - 0.1 K/uL   Immature Granulocytes 1 %   Abs Immature Granulocytes 0.08 (H) 0.00 - 0.07 K/uL  Sedimentation rate  Result Value Ref Range   Sed Rate 120 (H) 0 - 22 mm/hr     Murvin Natal, MD [facility]   07/17/2022  6:56 PM How to contact the [facility] Attending or Consulting provider [place] or covering provider during after hours 7P -7A, for this patient?  Check the care team in [facility] and look for a) attending/consulting [facility] provider listed and b) the [facility] team listed Log into [email or web address] and use [place]'s universal password to access. If you do not have the password, please contact the hospital operator. Locate the [facility] provider you are looking for under [facility] and page to a number that you can be directly reached. If you still have difficulty reaching the provider, please page the [facility] (Director on Call) for the Hospitalists listed on amion for assistance.

## 2022-07-18 NOTE — Consult Note (Signed)
NAME:  Jenna Hancock, MRN:  [id number], DOB:  1961/01/26, LOS: 0 ADMISSION DATE:  07/17/2022, CONSULTATION DATE:  2/2  REFERRING MD:  Wynetta Emery, CHIEF COMPLAINT:  cough/ sob    History of Present Illness:  61 yowf quit smoking 04/2023 with persistent resp cc's since exp to Rat feces and ticks in May j2018 but much worse since quit smoking with recurrent low grade fevers/ sorethroat and aches /fatiue admitted 2/1 with adx of atypical pna and PCCM consulted am 2/2 by Dr Wynetta Emery [facility].  Has has lots of recurrent sinus infections/ shoemaker f/u in past and continues to have nasal discharge though it's mostly white as is what she's coughing up with all cultures neg/  no dysphagia or asp events or further exposure hx.  Pertinent  Medical History  Jenna Hancock is a 62 y.o. female with medical history significant of anxiety, depression, GERD, hyperlipidemia, hypertension, sleep apnea, and more presents ED with a chief plaint of 4-6 weeks of cough.  Patient reports that she has had a cough, body aches, rigors and hypoxia.  It started with a cough 4 to 6 weeks PTA.  She has as needed oxygen at home and she has been using it for the last month.  She reports she keeps it set on 2 L at home.  Her cough sounds tight and is sometimes productive of yellow/green sputum.  She has had associated fevers with a Tmax of 102 1 week PTA > saw  PCP rx o  doxycycline, Levaquin, Zithromax without improvement.  She thought maybe the Levaquin was helping at first, but her symptoms were not relieved by the end of the course.  She reports that she has been getting sick so often that her PCP sent her to an allergist.  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  Avoidance measures were discussed.  She was started on Flonase, azelastine, and Zyrtec.  They also are planning to start immune screen.  Patient was also seeing pulmonology.   She was treated for bronchitis/recurrent sinus infections.  She was started on doxycycline and prednisone most recently.  Patient reports she has been using her nebulizer twice daily.  She has nausea but no vomiting.  She reports only thing that she can eat that does not make her nauseous was watermelon.  She has had some upper back pain between her shoulder blades.  Patient no longer smokes.  She denies a history of asthma, however there is a history of asthma noted in her chart.  She has been vaccinated for flu, but has not been able to get her COVID shot because she has been sick for 6 weeks.  She has not had her RSV shot either.     Significant Hospital Events: Including procedures, antibiotic start and stop dates in addition to other pertinent events   CT chest w/o contrast 2/1 Multifocal patchy/ground-glass opacities, upper lung predominant, favoring multifocal pneumonia in this clinical setting. In the absence of infectious symptoms, inflammatory etiologies such as hypersensitivity pneumonitis could have a similar appearance Mild mediastinal lymphadenopathy, likely reactive. Cholelithiasis, without associated inflammatory changes. CT sinus 2/2 Mild paranasal sinus mucosal thickening with air-fluid levels bilateral maxillary and sphenoid sinuses.  Interim History / Subjective:  Main c/o = fatigue and cough, no sob or need for 02 at rest   Objective   Blood pressure (!) 118/54, pulse 74,  temperature 97.8 F (36.6 C), temperature source Oral, resp. rate 19, height '5\' 4"'$  (1.626 m), weight 90.3 kg, SpO2 97 %.        Intake/Output Summary (Last 24 hours) at 07/18/2022 1152 Last data filed at 07/18/2022 0600 Gross per 24 hour  Intake 841.13 ml  Output --  Net 841.13 ml   Filed Weights   07/17/22 1853 07/18/22 0017  Weight: 89.8 kg 90.3 kg    Examination:  Tmax:  100.3  General appearance:    General: late middle aged appearing somber wf nad  At Rest 02 sats  97% on 2lpm   No  jvd Oropharynx clear,  mucosa nl Neck supple Lungs with a few scattered exp > insp rhonchi bilaterally RRR no s3 or or sign murmur Abd soft with nl  excursion  Extr warm with no edema or clubbing noted Neuro  Sensorium intact,  no apparent motor deficits      Assessment & Plan:  1)  AB fueled by unresolved sinus infection/ purulent pnds  >>> augmentin bid x 21 days then return to ENT for f/u  >>> rx duoneb/ avoid inhalers for now as may just aggravate the cough  2)  diffuse pulmonary infiltrates post URI/ CAP in Nov 2023  Miscellaneous:Alv microlithiasis, alv proteinosis, asp, bronchiectais, BOOP   Lab Results  Component Value Date   ESRSEDRATE 120 (H) 07/18/2022   ESRSEDRATE 53 (H) 06/25/2017   ARDS/ AIP Occupational dz/ HSP Neoplasm Infection > only active infection appears to be sinusitis > rx augmentin Drug  no exp to macrodantin/ chemo/ amio Pulmonary emboli, Protein disorders Edema/Eosinophilic dz  Eos 0.2 on admit Sarcoidosis Connective tissue dz > nothing to suggest  Hist X / Hemorrhage Idiopathic   Rec>>> d/c on pred 40 mg daily x  3 days then 20 mg daily x 6 days then 10 mg daily until seen in office   2)  02 dep resp failure/ acute on chronic >>> titrate to sats > 90% walking in hallways prior to d/c     Labs   CBC: Recent Labs  Lab 07/17/22 1925 07/18/22 0813  WBC 12.4* 8.2  NEUTROABS 9.4* 5.3  HGB 10.9* 10.5*  HCT 34.1* 33.8*  MCV 91.2 91.8  PLT 358 161    Basic Metabolic Panel: Recent Labs  Lab 07/17/22 1925 07/18/22 0455  NA 135 135  K 3.8 3.6  CL 99 106  CO2 25 23  GLUCOSE 125* 89  BUN 19 14  CREATININE 0.75 0.48  CALCIUM 9.5 8.9  MG  --  2.0   GFR: Estimated Creatinine Clearance: 79.3 mL/min (by C-G formula based on SCr of 0.48 mg/dL). Recent Labs  Lab 07/17/22 1924 07/17/22 1925 07/18/22 0813  PROCALCITON  --  <0.10  --   WBC  --  12.4* 8.2  LATICACIDVEN 1.3  --   --     Liver Function Tests: Recent Labs  Lab  07/17/22 1925 07/18/22 0455  AST 31 23  ALT 29 24  ALKPHOS 111 89  BILITOT 0.4 0.4  PROT 8.2* 7.3  ALBUMIN 3.0* 2.7*   No results for input(s): "LIPASE", "AMYLASE" in the last 168 hours. No results for input(s): "AMMONIA" in the last 168 hours.  ABG    Component Value Date/Time   TCO2 26 05/05/2008 1703     Coagulation Profile: No results for input(s): "INR", "PROTIME" in the last 168 hours.  Cardiac Enzymes: No results for input(s): "CKTOTAL", "CKMB", "CKMBINDEX", "TROPONINI" in the last 168  hours.  HbA1C: No results found for: "HGBA1C"  CBG: No results for input(s): "GLUCAP" in the last 168 hours.     Past Medical History:  She,  has a past medical history of Anxiety, Arthritis, Depression, Essential hypertension, benign (12/08/2016), GERD (gastroesophageal reflux disease), Heart murmur, High cholesterol (12/08/2016), Hyperlipidemia, Hypertension, PONV (postoperative nausea and vomiting), Recurrent upper respiratory infection (URI), Sleep apnea, Stroke ([place]), and TIA (transient ischemic attack) (2017).   Surgical History:   Past Surgical History:  Procedure Laterality Date   APPENDECTOMY     BIOPSY  12/10/2021   Procedure: BIOPSY;  Surgeon: Harvel Quale, MD;  Location: [facility];  Service: Gastroenterology;;   COLONOSCOPY WITH PROPOFOL N/A 12/10/2021   Procedure: COLONOSCOPY WITH PROPOFOL;  Surgeon: Harvel Quale, MD;  Location: [facility];  Service: Gastroenterology;  Laterality: N/A;  815   JOINT REPLACEMENT     rt knee 11 yrs ago   KNEE ARTHROSCOPY Left    NASAL SINUS SURGERY     POLYPECTOMY  12/10/2021   Procedure: POLYPECTOMY;  Surgeon: Harvel Quale, MD;  Location: [facility];  Service: Gastroenterology;;   TOTAL KNEE ARTHROPLASTY Left 02/01/2020   Procedure: TOTAL KNEE ARTHROPLASTY;  Surgeon: Susa Day, MD;  Location: [facility];  Service: Orthopedics;  Laterality: Left;  general or spinal     Social History:    reports that she quit smoking about 9 months ago. Her smoking use included cigarettes. She started smoking about 41 years ago. She has a 34.00 pack-year smoking history. She has never used smokeless tobacco. She reports that she does not drink alcohol and does not use drugs.   Family History:  Her family history includes Asthma in her son; Diabetes in her father; Heart disease in her father; Lung cancer in her mother; Stroke in her father.   Allergies Allergies  Allergen Reactions   Bactrim Rash   Sulfa Antibiotics Rash     Home Medications  Prior to Admission medications   Medication Sig Start Date End Date Taking? Authorizing Provider  albuterol (PROVENTIL) (2.5 MG/3ML) 0.083% nebulizer solution Take 3 mLs (2.5 mg total) by nebulization every 6 (six) hours as needed for wheezing or shortness of breath. 10/12/21  Yes Gherghe, Vella Redhead, MD  ALPRAZolam Duanne Moron) 0.5 MG tablet Take 0.5 mg by mouth 2 (two) times daily as needed for anxiety.    Yes [provider]  amLODipine (NORVASC) 5 MG tablet Take 5 mg by mouth daily.   Yes [provider]  aspirin 325 MG tablet Take 325 mg by mouth at bedtime.   Yes [provider]  atorvastatin (LIPITOR) 80 MG tablet Take 80 mg by mouth daily.   Yes [provider]  azelastine (ASTELIN) 0.1 % nasal spray Place 1 spray into both nostrils 2 (two) times daily. Use in each nostril as directed 06/23/22  Yes Patel, Odette Horns, MD  benzonatate (TESSALON) 200 MG capsule Take 1 capsule (200 mg total) by mouth 3 (three) times daily as needed for cough. 06/05/22  Yes Brand Males, MD  Budeson-Glycopyrrol-Formoterol (BREZTRI AEROSPHERE) 160-9-4.8 MCG/ACT AERO Inhale 2 puffs into the lungs in the morning and at bedtime. 03/27/22  Yes Brand Males, MD  cetirizine (ZYRTEC ALLERGY) 10 MG tablet Take 1 tablet (10 mg total) by mouth daily as needed for allergies. 06/23/22  Yes Larose Kells, MD  ezetimibe (ZETIA) 10 MG tablet Take 10  mg by mouth every evening. 11/24/19  Yes [provider]  FLUoxetine (  PROZAC) 40 MG capsule Take 80 mg by mouth daily. 12/20/19  Yes [provider]  fluticasone (FLONASE) 50 MCG/ACT nasal spray Place 2 sprays into both nostrils daily. 06/23/22  Yes Larose Kells, MD  folic acid (FOLVITE) 149 MCG tablet Take 800 mcg by mouth daily.   Yes [provider]  furosemide (LASIX) 20 MG tablet Take 20 mg by mouth daily. 05/01/22  Yes [provider]  HYDROcodone-acetaminophen (NORCO) 10-325 MG tablet Take 1 tablet by mouth in the morning and at bedtime.   Yes [provider]  irbesartan-hydrochlorothiazide (AVALIDE) 300-12.5 MG per tablet Take 1 tablet by mouth daily.   Yes [provider]  metoprolol tartrate (LOPRESSOR) 25 MG tablet Take 25 mg by mouth 2 (two) times daily. 11/15/19  Yes [provider]  Multiple Vitamins-Minerals (MULTIVITAMIN WITH MINERALS) tablet Take 1 tablet by mouth daily.   Yes [provider]  OVER THE COUNTER MEDICATION Take 1 capsule by mouth daily. Fish Oil   Yes [provider]  pantoprazole (PROTONIX) 40 MG tablet Take 30- 60 min before your first and last meals of the day Patient taking differently: Take 40 mg by mouth daily. 06/11/17  Yes Tanda Rockers, MD  promethazine-dextromethorphan (PROMETHAZINE-DM) 6.25-15 MG/5ML syrup Take 5 mLs by mouth 4 (four) times daily as needed for cough. 07/11/22  Yes Martyn Ehrich, NP  roflumilast (DALIRESP) 500 MCG TABS tablet Take 500 mcg by mouth daily.   Yes [provider]  doxycycline (VIBRA-TABS) 100 MG tablet Take 1 tablet (100 mg total) by mouth 2 (two) times daily. Patient not taking: Reported on 07/17/2022 06/30/22   Martyn Ehrich, NP  ibuprofen (ADVIL) 800 MG tablet one p.o. q day prn Patient not taking: Reported on 07/17/2022 05/28/22   [provider]  levofloxacin (LEVAQUIN) 500 MG tablet Take 1 tablet (500 mg total) by mouth  daily. Patient not taking: Reported on 07/17/2022 07/10/22   Martyn Ehrich, NP  minocycline (MINOCIN) 50 MG capsule Take 50 mg by mouth daily.    [provider]      Christinia Gully, MD Pulmonary and [place] [phone]   After 7:00 pm call Elink  [phone]

## 2022-07-18 NOTE — H&P (Signed)
History and Physical    Patient: Jenna Hancock DOB: 1960-11-27 DOA: 07/17/2022 DOS: the patient was seen and examined on 07/18/2022 PCP: Pomposini, Cherly Anderson, MD  Patient coming from: Home  Chief Complaint:  Chief Complaint  Patient presents with   Cough   HPI: Jenna Hancock is a 62 y.o. female with medical history significant of anxiety, depression, GERD, hyperlipidemia, hypertension, sleep apnea, and more presents ED with a chief plaint of 4-6 weeks of cough.  Patient reports that she has had a cough, body aches, rigors and hypoxia.  It started with a cough 4 to 6 weeks ago.  She has as needed oxygen at home and she has been using it for the last month.  She reports she keeps it set on 2 L at home.  Her cough sounds tight and is sometimes productive of yellow/green sputum.  She has had associated fevers with a Tmax of 102 1 week ago.  Patient reports that she is seen her PCP and she has been on doxycycline, Levaquin, Zithromax without improvement.  She thought maybe the Levaquin was helping at first, but her symptoms were not relieved by the end of the course.  She reports that she has been getting sick so often that her PCP sent her to an allergist.  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  Avoidance measures were discussed.  She was started on Flonase, azelastine, and Zyrtec.  They also are planning to start immune screen.  Patient was also seeing pulmonology.  She was treated for bronchitis/recurrent sinus infections.  She was started on doxycycline and prednisone most recently.  Patient reports she has been using her nebulizer twice daily.  She has nausea but no vomiting.  She reports only thing that she can eat that does not make her nauseous was watermelon.  She has had some upper back pain between her shoulder blades.  Patient no longer smokes.  She denies a history of asthma,  however there is a history of asthma noted in her chart.  She has been vaccinated for flu, but has not been able to get her COVID shot because she has been sick for 6 weeks.  She has not had her RSV shot either.  Patient has no other complaints at this time.  She does not smoke, does not drink.  She prefers to be full code. Review of Systems: As mentioned in the history of present illness. All other systems reviewed and are negative. Past Medical History:  Diagnosis Date   Anxiety    Arthritis    Depression    Essential hypertension, benign 12/08/2016   GERD (gastroesophageal reflux disease)    Heart murmur    AS A CHILD    High cholesterol 12/08/2016   Hyperlipidemia    Hypertension    PONV (postoperative nausea and vomiting)    Recurrent upper respiratory infection (URI)    Sleep apnea    DOES NOT USE CPAP    Stroke ([place])    HX OF MINI STROKE 2016    TIA (transient ischemic attack) 2017   Past Surgical History:  Procedure Laterality Date   APPENDECTOMY     BIOPSY  12/10/2021   Procedure: BIOPSY;  Surgeon: Harvel Quale, MD;  Location: [facility];  Service: Gastroenterology;;   COLONOSCOPY WITH PROPOFOL N/A 12/10/2021   Procedure: COLONOSCOPY WITH PROPOFOL;  Surgeon: Montez Morita, Quillian Quince, MD;  Location: [facility];  Service: Gastroenterology;  Laterality: N/A;  815   JOINT REPLACEMENT     rt knee 11 yrs ago   KNEE ARTHROSCOPY Left    NASAL SINUS SURGERY     POLYPECTOMY  12/10/2021   Procedure: POLYPECTOMY;  Surgeon: Harvel Quale, MD;  Location: [facility];  Service: Gastroenterology;;   TOTAL KNEE ARTHROPLASTY Left 02/01/2020   Procedure: TOTAL KNEE ARTHROPLASTY;  Surgeon: Susa Day, MD;  Location: [facility];  Service: Orthopedics;  Laterality: Left;  general or spinal   Social History:  reports that she quit smoking about 9 months ago. Her smoking use included cigarettes. She started smoking about 41 years ago. She has a 34.00 pack-year  smoking history. She has never used smokeless tobacco. She reports that she does not drink alcohol and does not use drugs.  Allergies  Allergen Reactions   Bactrim Rash   Sulfa Antibiotics Rash    Family History  Problem Relation Age of Onset   Lung cancer Mother    Diabetes Father    Stroke Father    Heart disease Father    Asthma Son     Prior to Admission medications   Medication Sig Start Date End Date Taking? Authorizing Provider  albuterol (PROVENTIL) (2.5 MG/3ML) 0.083% nebulizer solution Take 3 mLs (2.5 mg total) by nebulization every 6 (six) hours as needed for wheezing or shortness of breath. 10/12/21  Yes Gherghe, Vella Redhead, MD  ALPRAZolam Duanne Moron) 0.5 MG tablet Take 0.5 mg by mouth 2 (two) times daily as needed for anxiety.    Yes [provider]  amLODipine (NORVASC) 5 MG tablet Take 5 mg by mouth daily.   Yes [provider]  aspirin 325 MG tablet Take 325 mg by mouth at bedtime.   Yes [provider]  atorvastatin (LIPITOR) 80 MG tablet Take 80 mg by mouth daily.   Yes [provider]  azelastine (ASTELIN) 0.1 % nasal spray Place 1 spray into both nostrils 2 (two) times daily. Use in each nostril as directed 06/23/22  Yes Patel, Odette Horns, MD  benzonatate (TESSALON) 200 MG capsule Take 1 capsule (200 mg total) by mouth 3 (three) times daily as needed for cough. 06/05/22  Yes Brand Males, MD  Budeson-Glycopyrrol-Formoterol (BREZTRI AEROSPHERE) 160-9-4.8 MCG/ACT AERO Inhale 2 puffs into the lungs in the morning and at bedtime. 03/27/22  Yes Brand Males, MD  cetirizine (ZYRTEC ALLERGY) 10 MG tablet Take 1 tablet (10 mg total) by mouth daily as needed for allergies. 06/23/22  Yes Larose Kells, MD  ezetimibe (ZETIA) 10 MG tablet Take 10 mg by mouth every evening. 11/24/19  Yes [provider]  FLUoxetine (PROZAC) 40 MG capsule Take 80 mg by mouth daily. 12/20/19  Yes [provider]  fluticasone (FLONASE) 50 MCG/ACT nasal  spray Place 2 sprays into both nostrils daily. 06/23/22  Yes Larose Kells, MD  folic acid (FOLVITE) 956 MCG tablet Take 800 mcg by mouth daily.   Yes [provider]  furosemide (LASIX) 20 MG tablet Take 20 mg by mouth daily. 05/01/22  Yes [provider]  HYDROcodone-acetaminophen (NORCO) 10-325 MG tablet Take 1 tablet by mouth in the morning and at bedtime.   Yes [provider]  irbesartan-hydrochlorothiazide (AVALIDE) 300-12.5 MG per tablet Take 1 tablet by mouth daily.   Yes [provider]  metoprolol tartrate (LOPRESSOR) 25 MG tablet Take 25 mg by mouth 2 (two) times  daily. 11/15/19  Yes [provider]  Multiple Vitamins-Minerals (MULTIVITAMIN WITH MINERALS) tablet Take 1 tablet by mouth daily.   Yes [provider]  OVER THE COUNTER MEDICATION Take 1 capsule by mouth daily. Fish Oil   Yes [provider]  pantoprazole (PROTONIX) 40 MG tablet Take 30- 60 min before your first and last meals of the day Patient taking differently: Take 40 mg by mouth daily. 06/11/17  Yes Tanda Rockers, MD  promethazine-dextromethorphan (PROMETHAZINE-DM) 6.25-15 MG/5ML syrup Take 5 mLs by mouth 4 (four) times daily as needed for cough. 07/11/22  Yes Martyn Ehrich, NP  Roflumilast (DALIRESP) 250 MCG TABS Take 1 tablet by mouth daily. 03/06/22  Yes Brand Males, MD  doxycycline (VIBRA-TABS) 100 MG tablet Take 1 tablet (100 mg total) by mouth 2 (two) times daily. Patient not taking: Reported on 07/17/2022 06/30/22   Martyn Ehrich, NP  ibuprofen (ADVIL) 800 MG tablet one p.o. q day prn Patient not taking: Reported on 07/17/2022 05/28/22   [provider]  levofloxacin (LEVAQUIN) 500 MG tablet Take 1 tablet (500 mg total) by mouth daily. Patient not taking: Reported on 07/17/2022 07/10/22   Martyn Ehrich, NP  minocycline (MINOCIN) 50 MG capsule Take 50 mg by mouth daily.    [provider]    Physical Exam: Vitals:    07/17/22 2200 07/17/22 2252 07/17/22 2300 07/18/22 0017  BP: (!) 154/74  (!) 136/49 (!) 120/55  Pulse: 86 88 87 73  Resp: '16  16 19  '$ Temp: 98.9 F (37.2 C)   99.5 F (37.5 C)  TempSrc: Oral   Oral  SpO2: 90% 91% 98% 94%  Weight:    90.3 kg  Height:       1.  General: Patient lying supine in bed,  no acute distress   2. Psychiatric: Alert and oriented x 3, mood and behavior normal for situation, pleasant and cooperative with exam   3. Neurologic: Speech and language are normal, face is symmetric, moves all 4 extremities voluntarily, at baseline without acute deficits on limited exam   4. HEENMT:  Head is atraumatic, normocephalic, pupils reactive to light, neck is supple, trachea is midline, mucous membranes are moist   5. Respiratory : Mild wheezing with cough noted, no rhonchi, rales, no cyanosis, no increase in work of breathing or accessory muscle use, maintaining oxygen sats on 2 L nasal cannula   6. Cardiovascular : Heart rate normal, rhythm is regular, no rubs or gallops, no peripheral edema, peripheral pulses palpated   7. Gastrointestinal:  Abdomen is soft, nondistended, nontender to palpation bowel sounds active, no masses or organomegaly palpated   8. Skin:  Skin is warm, dry and intact without rashes, acute lesions, or ulcers on limited exam   9.Musculoskeletal:  No acute deformities or trauma, no asymmetry in tone, no peripheral edema, peripheral pulses palpated, no tenderness to palpation in the extremities  Data Reviewed: In the ED Temp 100.3, heart rate 84-86, blood pressure 151/62-154/74, heart rate 18, satting at 90-94% on 2 L Leukocytosis 12.4, hemoglobin 10.9 Chemistry is unremarkable Albumin 3.0 Negative COVID, flu, RSV CTA shows multifocal pneumonia Chest x-ray shows multifocal airspace disease left greater than right Patient was not started on any antibiotics or given Tylenol At admission patient was started on Rocephin and Zithromax, given  Tylenol, and started on breathing treatments  Assessment and Plan: * CAP (community acquired pneumonia) - Multifocal pneumonia shown on imaging - Has been on antibiotics in the  outpatient setting including doxycycline, Zithromax, Levaquin - Blood cultures pending - Sputum culture pending - Legionella and strep antigens pending - Continue supportive care with Tessalon Perles and breathing treatments - Continue to monitor  Essential hypertension, benign - Continue amlodipine Avalide  Chronic respiratory failure (HCC) - As needed oxygen at home - Currently maintaining oxygen sats on 2 L nasal cannula - Likely requiring oxygen due to pneumonia - PE ruled out with CTA chest - Continue to monitor  High cholesterol - Continue statin and Zetia      Advance Care Planning:   Code Status: Full Code  Consults: None at this time  Family Communication: Boyfriend at bedside  Severity of Illness: The appropriate patient status for this patient is OBSERVATION. Observation status is judged to be reasonable and necessary in order to provide the required intensity of service to ensure the patient's safety. The patient's presenting symptoms, physical exam findings, and initial radiographic and laboratory data in the context of their medical condition is felt to place them at decreased risk for further clinical deterioration. Furthermore, it is anticipated that the patient will be medically stable for discharge from the hospital within 2 midnights of admission.   Author: Rolla Plate, DO 07/18/2022 12:23 AM  For on call review [email or web address].

## 2022-07-18 NOTE — Progress Notes (Signed)
1705 Patient unavailable , eating dinner.

## 2022-07-18 NOTE — Progress Notes (Signed)
RT unavailable for 1245 nebulizer treatment due to emergent patient care in ER.

## 2022-07-18 NOTE — Assessment & Plan Note (Signed)
-   Continue amlodipine Avalide

## 2022-07-18 NOTE — Assessment & Plan Note (Signed)
-   As needed oxygen at home - Currently maintaining oxygen sats on 2 L nasal cannula - Likely requiring oxygen due to pneumonia - PE ruled out with CTA chest - Continue to monitor

## 2022-07-19 DIAGNOSIS — I10 Essential (primary) hypertension: Secondary | ICD-10-CM | POA: Diagnosis not present

## 2022-07-19 DIAGNOSIS — J189 Pneumonia, unspecified organism: Secondary | ICD-10-CM | POA: Diagnosis not present

## 2022-07-19 DIAGNOSIS — J9611 Chronic respiratory failure with hypoxia: Secondary | ICD-10-CM | POA: Diagnosis not present

## 2022-07-19 DIAGNOSIS — E7800 Pure hypercholesterolemia, unspecified: Secondary | ICD-10-CM | POA: Diagnosis not present

## 2022-07-19 LAB — CBC WITH DIFFERENTIAL/PLATELET
Abs Immature Granulocytes: 0.11 K/uL — ABNORMAL HIGH (ref 0.00–0.07)
Basophils Absolute: 0 K/uL (ref 0.0–0.1)
Basophils Relative: 0 %
Eosinophils Absolute: 0 K/uL (ref 0.0–0.5)
Eosinophils Relative: 0 %
HCT: 34.1 % — ABNORMAL LOW (ref 36.0–46.0)
Hemoglobin: 11.1 g/dL — ABNORMAL LOW (ref 12.0–15.0)
Immature Granulocytes: 1 %
Lymphocytes Relative: 7 %
Lymphs Abs: 0.9 K/uL (ref 0.7–4.0)
MCH: 29.2 pg (ref 26.0–34.0)
MCHC: 32.6 g/dL (ref 30.0–36.0)
MCV: 89.7 fL (ref 80.0–100.0)
Monocytes Absolute: 0.2 K/uL (ref 0.1–1.0)
Monocytes Relative: 1 %
Neutro Abs: 11.5 K/uL — ABNORMAL HIGH (ref 1.7–7.7)
Neutrophils Relative %: 91 %
Platelets: 346 K/uL (ref 150–400)
RBC: 3.8 MIL/uL — ABNORMAL LOW (ref 3.87–5.11)
RDW: 12.2 % (ref 11.5–15.5)
WBC: 12.7 K/uL — ABNORMAL HIGH (ref 4.0–10.5)
nRBC: 0 % (ref 0.0–0.2)

## 2022-07-19 MED ORDER — PREDNISONE 10 MG PO TABS: ORAL_TABLET | ORAL | 0 refills | Status: AC

## 2022-07-19 MED ORDER — AMOXICILLIN-POT CLAVULANATE 875-125 MG PO TABS: 1.0000 | ORAL_TABLET | Freq: Two times a day (BID) | ORAL | 0 refills | Status: AC

## 2022-07-19 MED ORDER — ORAL CARE MOUTH RINSE: 15.0000 mL | OROMUCOSAL | Status: DC | PRN

## 2022-07-19 MED ORDER — PANTOPRAZOLE SODIUM 40 MG PO TBEC: 40.0000 mg | DELAYED_RELEASE_TABLET | Freq: Every day | ORAL | Status: DC

## 2022-07-19 MED ORDER — IPRATROPIUM-ALBUTEROL 0.5-2.5 (3) MG/3ML IN SOLN: 3.0000 mL | Freq: Four times a day (QID) | RESPIRATORY_TRACT | 2 refills | Status: DC

## 2022-07-19 NOTE — Discharge Summary (Signed)
Physician Discharge Summary  Jenna Hancock BPZ:[id number] DOB: [birth year] DOA: 07/17/2022  PCP: Clinton Quant, MD Pulmonologist: Dr. Chase Caller ENT: Dr. Wilburn Cornelia Allergist: Dr. Harlon Flor  Admit date: 07/17/2022 Discharge date: 07/19/2022  Admitted From:  HOME  Disposition:  Home with husband  Recommendations for Outpatient Follow-up:   Please make an appointment with your ENT physician Dr. Wilburn Cornelia as soon as possible regarding your severe sinus disease.  Please follow-up with your pulmonologist as scheduled on 07/28/2022.  Please follow-up with your allergist Dr Posey Pronto as soon as possible.  Dr. Melvyn Novas left these specific instructions regarding your treatment:   Take prednisone 40 mg daily for 3 days followed by 20 mg daily for 6 days followed by 10 mg daily for 5 days.  Take Augmentin for full 21-day course of treatment  Please continue to take probiotics daily especially while you are on antibiotic treatment.  Please use your nebulizer treatments and avoid inhalers for now as they can increase coughing.     Discharge Condition: STABLE   CODE STATUS: FULL DIET: resume prior home diet    Brief Hospitalization Summary: Please see all hospital notes, images, labs for full details of the hospitalization. ADMISSION HPI:  62 y.o. female with medical history significant of anxiety, depression, GERD, hyperlipidemia, hypertension, sleep apnea, and more presents ED with a chief plaint of 4-6 weeks of cough.  Patient reports that she has had a cough, body aches, rigors and hypoxia.  It started with a cough 4 to 6 weeks ago.  She has as needed oxygen at home and she has been using it for the last month.  She reports she keeps it set on 2 L at home.  Her cough sounds tight and is sometimes productive of yellow/green sputum.  She has had associated fevers with a Tmax of 102 1 week ago.  Patient reports that she is seen her PCP and she has been on doxycycline, Levaquin, Zithromax without  improvement.  She thought maybe the Levaquin was helping at first, but her symptoms were not relieved by the end of the course.  She reports that she has been getting sick so often that her PCP sent her to an allergist.  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  Avoidance measures were discussed.  She was started on Flonase, azelastine, and Zyrtec.  They also are planning to start immune screen.  Patient was also seeing pulmonology.  She was treated for bronchitis/recurrent sinus infections.  She was started on doxycycline and prednisone most recently.  Patient reports she has been using her nebulizer twice daily.  She has nausea but no vomiting.  She reports only thing that she can eat that does not make her nauseous was watermelon.  She has had some upper back pain between her shoulder blades.  Patient no longer smokes.  She denies a history of asthma, however there is a history of asthma noted in her chart.  She has been vaccinated for flu, but has not been able to get her COVID shot because she has been sick for 6 weeks.  She has not had her RSV shot either.  Patient has no other complaints at this time.   HOSPITAL COURSE   Pt was admitted with an initial concern for pneumonia that would not resolve and persistent cough and respiratory symptoms.  Pulmonologist was consulted Dr. Melvyn Novas evaluated patient carefully  and given the abnormal CT findings of severe sinusitis disease he felt that with her constant postnasal drainage that the patient was suffering from acute bronchitis from purulent postnasal drainage from a sinusitis.  He recommended that she take Augmentin for 21-day course along with a prednisone taper and close follow-up with ENT and outpatient pulmonology and allergist.  I explained his recommendations to the patient and husband and they verbalized understanding and I provided them with written  information regarding the instructions as well.  He has an appointment with pulmonary on 07/28/2022.  I put a referral in for her to see Dr. Wilburn Cornelia for ENT services and asked her to call and get an appointment to follow-up with her allergist as soon as possible.  The patient verbalized understanding.  She has been weaned to room air.  She has a home nebulizer and I have prescribed the neb treatments recommended by Dr. Melvyn Novas.  I have detailed her instructions in the after visit summary.  Patient is stable to discharge home today.  Discharge Diagnoses:  Principal Problem:   CAP (community acquired pneumonia) Active Problems:   Essential hypertension, benign   High cholesterol   Chronic respiratory failure [facility])  Discharge Instructions: Discharge Instructions     Ambulatory referral to ENT   Complete by: As directed    PLEASE SCHEDULE IN 1 MONTH      Allergies as of 07/19/2022       Reactions   Bactrim Rash   Sulfa Antibiotics Rash        Medication List     STOP taking these medications    albuterol (2.5 MG/3ML) 0.083% nebulizer solution Commonly known as: PROVENTIL   Breztri Aerosphere 160-9-4.8 MCG/ACT Aero Generic drug: Budeson-Glycopyrrol-Formoterol   doxycycline 100 MG tablet Commonly known as: VIBRA-TABS   ibuprofen 800 MG tablet Commonly known as: ADVIL   irbesartan-hydrochlorothiazide 300-12.5 MG tablet Commonly known as: AVALIDE   levofloxacin 500 MG tablet Commonly known as: LEVAQUIN   minocycline 50 MG capsule Commonly known as: MINOCIN   roflumilast 500 MCG Tabs tablet Commonly known as: DALIRESP       TAKE these medications    ALPRAZolam 0.5 MG tablet Commonly known as: XANAX Take 0.5 mg by mouth 2 (two) times daily as needed for anxiety.   amLODipine 5 MG tablet Commonly known as: NORVASC Take 5 mg by mouth daily.   amoxicillin-clavulanate 875-125 MG tablet Commonly known as: AUGMENTIN Take 1 tablet by mouth every 12 (twelve) hours  for 20 days.   aspirin 325 MG tablet Take 325 mg by mouth at bedtime.   atorvastatin 80 MG tablet Commonly known as: LIPITOR Take 80 mg by mouth daily.   azelastine 0.1 % nasal spray Commonly known as: ASTELIN Place 1 spray into both nostrils 2 (two) times daily. Use in each nostril as directed   benzonatate 200 MG capsule Commonly known as: TESSALON Take 1 capsule (200 mg total) by mouth 3 (three) times daily as needed for cough.   cetirizine 10 MG tablet Commonly known as: ZyrTEC Allergy Take 1 tablet (10 mg total) by mouth daily as needed for allergies.   ezetimibe 10 MG tablet Commonly known as: ZETIA Take 10 mg by mouth every evening.   FLUoxetine 40 MG capsule Commonly known as: PROZAC Take 80 mg by mouth daily.   fluticasone 50 MCG/ACT nasal spray Commonly known as: FLONASE Place 2 sprays into both nostrils daily.   folic acid 825 MCG tablet Commonly known as: FOLVITE Take 800  mcg by mouth daily.   furosemide 20 MG tablet Commonly known as: LASIX Take 20 mg by mouth daily.   HYDROcodone-acetaminophen 10-325 MG tablet Commonly known as: NORCO Take 1 tablet by mouth in the morning and at bedtime.   ipratropium-albuterol 0.5-2.5 (3) MG/3ML Soln Commonly known as: DUONEB Take 3 mLs by nebulization 4 (four) times daily.   metoprolol tartrate 25 MG tablet Commonly known as: LOPRESSOR Take 25 mg by mouth 2 (two) times daily.   multivitamin with minerals tablet Take 1 tablet by mouth daily.   OVER THE COUNTER MEDICATION Take 1 capsule by mouth daily. Fish Oil   pantoprazole 40 MG tablet Commonly known as: PROTONIX Take 1 tablet (40 mg total) by mouth daily.   predniSONE 10 MG tablet Commonly known as: DELTASONE Take 4 tablets (40 mg total) by mouth daily for 3 days, THEN 2 tablets (20 mg total) daily for 6 days, THEN 1 tablet (10 mg total) daily for 6 days. Start taking on: July 20, 2022   promethazine-dextromethorphan 6.25-15 MG/5ML  syrup Commonly known as: PROMETHAZINE-DM Take 5 mLs by mouth 4 (four) times daily as needed for cough.        Follow-up Information     Brand Males, MD. Go on 07/28/2022.   Specialty: Pulmonary Disease Why: PLEASE DON'T MISS THIS APPT Contact information: [address] [place] [address] [phone]         Pomposini, Cherly Anderson, MD. Schedule an appointment as soon as possible for a visit.   Specialty: Internal Medicine Why: Hospital Follow Up        Larose Kells, MD. Schedule an appointment as soon as possible for a visit in 2 week(s).   Specialty: Allergy and Immunology Why: Hospital Follow Up Contact information: [address] [place] [address] [phone]         Jerrell Belfast, MD. Schedule an appointment as soon as possible for a visit in 1 month(s).   Specialty: Otolaryngology Why: Hospital Follow Up Contact information: [address] [place] [address] [phone]                Allergies  Allergen Reactions   Bactrim Rash   Sulfa Antibiotics Rash   Allergies as of 07/19/2022       Reactions   Bactrim Rash   Sulfa Antibiotics Rash        Medication List     STOP taking these medications    albuterol (2.5 MG/3ML) 0.083% nebulizer solution Commonly known as: PROVENTIL   Breztri Aerosphere 160-9-4.8 MCG/ACT Aero Generic drug: Budeson-Glycopyrrol-Formoterol   doxycycline 100 MG tablet Commonly known as: VIBRA-TABS   ibuprofen 800 MG tablet Commonly known as: ADVIL   irbesartan-hydrochlorothiazide 300-12.5 MG tablet Commonly known as: AVALIDE   levofloxacin 500 MG tablet Commonly known as: LEVAQUIN   minocycline 50 MG capsule Commonly known as: MINOCIN   roflumilast 500 MCG Tabs tablet Commonly known as: DALIRESP       TAKE these medications    ALPRAZolam 0.5 MG tablet Commonly known as: XANAX Take 0.5 mg by mouth 2 (two) times daily as needed for anxiety.    amLODipine 5 MG tablet Commonly known as: NORVASC Take 5 mg by mouth daily.   amoxicillin-clavulanate 875-125 MG tablet Commonly known as: AUGMENTIN Take 1 tablet by mouth every 12 (twelve) hours for 20 days.   aspirin 325 MG tablet Take 325 mg by mouth at bedtime.   atorvastatin  80 MG tablet Commonly known as: LIPITOR Take 80 mg by mouth daily.   azelastine 0.1 % nasal spray Commonly known as: ASTELIN Place 1 spray into both nostrils 2 (two) times daily. Use in each nostril as directed   benzonatate 200 MG capsule Commonly known as: TESSALON Take 1 capsule (200 mg total) by mouth 3 (three) times daily as needed for cough.   cetirizine 10 MG tablet Commonly known as: ZyrTEC Allergy Take 1 tablet (10 mg total) by mouth daily as needed for allergies.   ezetimibe 10 MG tablet Commonly known as: ZETIA Take 10 mg by mouth every evening.   FLUoxetine 40 MG capsule Commonly known as: PROZAC Take 80 mg by mouth daily.   fluticasone 50 MCG/ACT nasal spray Commonly known as: FLONASE Place 2 sprays into both nostrils daily.   folic acid 423 MCG tablet Commonly known as: FOLVITE Take 800 mcg by mouth daily.   furosemide 20 MG tablet Commonly known as: LASIX Take 20 mg by mouth daily.   HYDROcodone-acetaminophen 10-325 MG tablet Commonly known as: NORCO Take 1 tablet by mouth in the morning and at bedtime.   ipratropium-albuterol 0.5-2.5 (3) MG/3ML Soln Commonly known as: DUONEB Take 3 mLs by nebulization 4 (four) times daily.   metoprolol tartrate 25 MG tablet Commonly known as: LOPRESSOR Take 25 mg by mouth 2 (two) times daily.   multivitamin with minerals tablet Take 1 tablet by mouth daily.   OVER THE COUNTER MEDICATION Take 1 capsule by mouth daily. Fish Oil   pantoprazole 40 MG tablet Commonly known as: PROTONIX Take 1 tablet (40 mg total) by mouth daily.   predniSONE 10 MG tablet Commonly known as: DELTASONE Take 4 tablets (40 mg total) by mouth  daily for 3 days, THEN 2 tablets (20 mg total) daily for 6 days, THEN 1 tablet (10 mg total) daily for 6 days. Start taking on: July 20, 2022   promethazine-dextromethorphan 6.25-15 MG/5ML syrup Commonly known as: PROMETHAZINE-DM Take 5 mLs by mouth 4 (four) times daily as needed for cough.        Procedures/Studies: CT Maxillofacial LTD WO CM  Result Date: 07/18/2022 CLINICAL DATA:  cough EXAM: CT PARANASAL SINUS LIMITED WITHOUT CONTRAST TECHNIQUE: Non-contiguous multidetector CT images of the paranasal sinuses were obtained in a single plane without contrast. RADIATION DOSE REDUCTION: This exam was performed according to the departmental dose-optimization program which includes automated exposure control, adjustment of the mA and/or kV according to patient size and/or use of iterative reconstruction technique. COMPARISON:  Maxillofacial CT 06/20/2022. FINDINGS: Limited sinus CT. This demonstrate probable prior ethmoidectomies with mild scattered paranasal sinus mucosal thickening and air-fluid levels within bilateral maxillary and sphenoid sinuses. No evidence of acute fracture. TMJs are located. IMPRESSION: Mild paranasal sinus mucosal thickening with air-fluid levels in bilateral maxillary and sphenoid sinuses. Electronically Signed   By: Margaretha Sheffield M.D.   On: 07/18/2022 11:45   CT Chest Wo Contrast  Result Date: 07/17/2022 CLINICAL DATA:  Fever, cough, abnormal chest radiograph EXAM: CT CHEST WITHOUT CONTRAST TECHNIQUE: Multidetector CT imaging of the chest was performed following the standard protocol without IV contrast. RADIATION DOSE REDUCTION: This exam was performed according to the departmental dose-optimization program which includes automated exposure control, adjustment of the mA and/or kV according to patient size and/or use of iterative reconstruction technique. COMPARISON:  Chest radiographs dated 07/17/2022 and 07/01/2022. CT chest dated 11/27/2021. FINDINGS:  Cardiovascular: The heart is normal in size. No pericardial effusion. No evidence of thoracic aortic  aneurysm. Atherosclerotic calcifications of the aortic arch. Mild atherosclerosis of the LAD. Mediastinum/Nodes: Small mediastinal nodes, including 75 m short axis AP window node, likely reactive. Visualized thyroid is unremarkable. Lungs/Pleura: Multifocal patchy/ground-glass opacities, upper lung predominant, favoring multifocal pneumonia in this clinical setting. In the absence of infectious symptoms, inflammatory etiologies such as hypersensitivity pneumonitis could have a similar appearance. No suspicious pulmonary nodules. No pleural effusion or pneumothorax. Upper Abdomen: Visualized upper abdomen is notable for cholelithiasis (series 2/image 163), without associated phlegm a tori changes. Musculoskeletal: Degenerative changes of the visualized thoracolumbar spine. IMPRESSION: Multifocal patchy/ground-glass opacities, upper lung predominant, favoring multifocal pneumonia in this clinical setting. In the absence of infectious symptoms, inflammatory etiologies such as hypersensitivity pneumonitis could have a similar appearance. Mild mediastinal lymphadenopathy, likely reactive. Cholelithiasis, without associated inflammatory changes. Electronically Signed   By: Julian Hy M.D.   On: 07/17/2022 21:15   DG Chest 2 View  Result Date: 07/17/2022 CLINICAL DATA:  Cough and chills EXAM: CHEST - 2 VIEW COMPARISON:  Chest x-ray 07/01/2022 FINDINGS: There are vague patchy multifocal airspace opacities in the mid and lower lungs, left greater than right. No pleural effusion or pneumothorax. Cardiomediastinal silhouette is within normal limits. No acute fractures are seen. IMPRESSION: Vague patchy multifocal airspace opacities in the mid and lower lungs, left greater than right, concerning for multifocal pneumonia. Electronically Signed   By: Ronney Asters M.D.   On: 07/17/2022 19:53   DG Chest 2 View  Result  Date: 07/02/2022 CLINICAL DATA:  Cough EXAM: CHEST - 2 VIEW COMPARISON:  Chest x-ray 03/06/2022, CT chest 12/16/2020, CT chest 10/09/2021 FINDINGS: The heart and mediastinal contours are unchanged. Left lower lung zone linear atelectasis versus scarring. No focal consolidation. Chronic coarsened interstitial markings with no overt pulmonary edema. No pleural effusion. No pneumothorax. No acute osseous abnormality. IMPRESSION: No active cardiopulmonary disease in a patient with chronic bronchitic changes. Electronically Signed   By: Iven Finn M.D.   On: 07/02/2022 03:36   CT MAXILLOFACIAL WO CONTRAST  Result Date: 06/20/2022 CLINICAL DATA:  Sinusitis. EXAM: CT MAXILLOFACIAL WITHOUT CONTRAST TECHNIQUE: Multidetector CT imaging of the maxillofacial structures was performed. Multiplanar CT image reconstructions were also generated. RADIATION DOSE REDUCTION: This exam was performed according to the departmental dose-optimization program which includes automated exposure control, adjustment of the mA and/or kV according to patient size and/or use of iterative reconstruction technique. COMPARISON:  None Available. FINDINGS: Paranasal sinuses: Frontal: Normally aerated. Patent frontal sinus drainage pathways. Ethmoid: Normally aerated. Maxillary: Normally aerated. Status post sinonasal surgery bilaterally. Sphenoid: Normally aerated. Patent sphenoethmoidal recesses. Right ostiomeatal unit: Patent. Left ostiomeatal unit: Patent. Nasal passages: Patent. Intact nasal septum is midline. Other: Orbits and intracranial compartment are unremarkable. Visible mastoid air cells are normally aerated. IMPRESSION: 1. Normally aerated paranasal sinuses. Patent sinus drainage pathways. 2. Status post sinonasal surgery bilaterally. Electronically Signed   By: Ronney Asters M.D.   On: 06/20/2022 20:32     Subjective: Overall patient is breathing better with nebulizer treatments.  She verbalized understanding of the instructions.   Patient is still having a lot of postnasal drainage.  Discharge Exam: Vitals:   07/19/22 0509 07/19/22 0900  BP: (!) 129/55   Pulse: 70   Resp: 18   Temp: (!) 97.5 F (36.4 C)   SpO2: 99% 96%   Vitals:   07/18/22 2028 07/18/22 2040 07/19/22 0509 07/19/22 0900  BP:  114/74 (!) 129/55   Pulse:  79 70   Resp:  18 18  Temp:  98.2 F (36.8 C) (!) 97.5 F (36.4 C)   TempSrc:  Oral Oral   SpO2: 91% 95% 99% 96%  Weight:      Height:       General: Pt is alert, awake, not in acute distress Cardiovascular: normal S1/S2 +, no rubs, no gallops Respiratory: CTA bilaterally, no wheezing, no rhonchi (listened right after neb tx) Abdominal: Soft, NT, ND, bowel sounds + Extremities: no edema, no cyanosis   The results of significant diagnostics from this hospitalization (including imaging, microbiology, ancillary and laboratory) are listed below for reference.     Microbiology: Recent Results (from the past 240 hour(s))  Resp panel by RT-PCR (RSV, Flu A&B, Covid) Anterior Nasal Swab     Status: None   Collection Time: 07/17/22  7:27 PM   Specimen: Anterior Nasal Swab  Result Value Ref Range Status   SARS Coronavirus 2 by RT PCR NEGATIVE NEGATIVE Final    Comment: (NOTE) SARS-CoV-2 target nucleic acids are NOT DETECTED.  The SARS-CoV-2 RNA is generally detectable in upper respiratory specimens during the acute phase of infection. The lowest concentration of SARS-CoV-2 viral copies this assay can detect is 138 copies/mL. A negative result does not preclude SARS-Cov-2 infection and should not be used as the sole basis for treatment or other patient management decisions. A negative result may occur with  improper specimen collection/handling, submission of specimen other than nasopharyngeal swab, presence of viral mutation(s) within the areas targeted by this assay, and inadequate number of viral copies(<138 copies/mL). A negative result must be combined with clinical observations,  patient history, and epidemiological information. The expected result is Negative.  Fact Sheet for Patients:  EntrepreneurPulse.com.au  Fact Sheet for Healthcare Providers:  IncredibleEmployment.be  This test is no t yet approved or cleared by the [place] FDA and  has been authorized for detection and/or diagnosis of SARS-CoV-2 by FDA under an Emergency Use Authorization (EUA). This EUA will remain  in effect (meaning this test can be used) for the duration of the COVID-19 declaration under Section 564(b)(1) of the Act, 21 U.S.C.section 360bbb-3(b)(1), unless the authorization is terminated  or revoked sooner.       Influenza A by PCR NEGATIVE NEGATIVE Final   Influenza B by PCR NEGATIVE NEGATIVE Final    Comment: (NOTE) The Xpert Xpress SARS-CoV-2/FLU/RSV plus assay is intended as an aid in the diagnosis of influenza from Nasopharyngeal swab specimens and should not be used as a sole basis for treatment. Nasal washings and aspirates are unacceptable for Xpert Xpress SARS-CoV-2/FLU/RSV testing.  Fact Sheet for Patients: EntrepreneurPulse.com.au  Fact Sheet for Healthcare Providers: IncredibleEmployment.be  This test is not yet approved or cleared by the [place] FDA and has been authorized for detection and/or diagnosis of SARS-CoV-2 by FDA under an Emergency Use Authorization (EUA). This EUA will remain in effect (meaning this test can be used) for the duration of the COVID-19 declaration under Section 564(b)(1) of the Act, 21 U.S.C. section 360bbb-3(b)(1), unless the authorization is terminated or revoked.     Resp Syncytial Virus by PCR NEGATIVE NEGATIVE Final    Comment: (NOTE) Fact Sheet for Patients: EntrepreneurPulse.com.au  Fact Sheet for Healthcare Providers: IncredibleEmployment.be  This test is not yet approved or cleared by the [place] FDA and has been authorized for detection and/or diagnosis of SARS-CoV-2 by FDA under an Emergency Use Authorization (EUA). This EUA will remain in effect (meaning this test can be used) for the duration of the COVID-19  declaration under Section 564(b)(1) of the Act, 21 U.S.C. section 360bbb-3(b)(1), unless the authorization is terminated or revoked.  Performed at [facility], [address], [place], [place] [address]   Culture, blood (Routine X 2) w Reflex to ID Panel     Status: None (Preliminary result)   Collection Time: 07/17/22 10:40 PM   Specimen: BLOOD  Result Value Ref Range Status   Specimen Description BLOOD BLOOD RIGHT HAND  Final   Special Requests   Final    BOTTLES DRAWN AEROBIC AND ANAEROBIC Blood Culture adequate volume   Culture   Final    NO GROWTH 2 DAYS Performed at [facility], [address], [place], [place] [address]    Report Status PENDING  Incomplete  Culture, blood (Routine X 2) w Reflex to ID Panel     Status: None (Preliminary result)   Collection Time: 07/17/22 10:41 PM   Specimen: BLOOD  Result Value Ref Range Status   Specimen Description BLOOD LEFT ANTECUBITAL  Final   Special Requests   Final    BOTTLES DRAWN AEROBIC AND ANAEROBIC Blood Culture adequate volume   Culture   Final    NO GROWTH 2 DAYS Performed at [facility], [address], [place], [place] [address]    Report Status PENDING  Incomplete  Respiratory (~20 pathogens) panel by PCR     Status: None   Collection Time: 07/18/22 11:15 AM   Specimen: Nasopharyngeal Swab; Respiratory  Result Value Ref Range Status   Adenovirus NOT DETECTED NOT DETECTED Final   Coronavirus 229E NOT DETECTED NOT DETECTED Final    Comment: (NOTE) The Coronavirus on the Respiratory Panel, DOES NOT test for the novel  Coronavirus (2019 nCoV)    Coronavirus HKU1 NOT DETECTED NOT DETECTED Final   Coronavirus NL63 NOT DETECTED NOT DETECTED Final   Coronavirus OC43 NOT DETECTED NOT DETECTED Final    Metapneumovirus NOT DETECTED NOT DETECTED Final   Rhinovirus / Enterovirus NOT DETECTED NOT DETECTED Final   Influenza A NOT DETECTED NOT DETECTED Final   Influenza B NOT DETECTED NOT DETECTED Final   Parainfluenza Virus 1 NOT DETECTED NOT DETECTED Final   Parainfluenza Virus 2 NOT DETECTED NOT DETECTED Final   Parainfluenza Virus 3 NOT DETECTED NOT DETECTED Final   Parainfluenza Virus 4 NOT DETECTED NOT DETECTED Final   Respiratory Syncytial Virus NOT DETECTED NOT DETECTED Final   Bordetella pertussis NOT DETECTED NOT DETECTED Final   Bordetella Parapertussis NOT DETECTED NOT DETECTED Final   Chlamydophila pneumoniae NOT DETECTED NOT DETECTED Final   Mycoplasma pneumoniae NOT DETECTED NOT DETECTED Final    Comment: Performed at [facility], [place] [address], [place], [place] [address]     Labs: BNP (last 3 results) No results for input(s): "BNP" in the last 8760 hours. Basic Metabolic Panel: Recent Labs  Lab 07/17/22 1925 07/18/22 0455  NA 135 135  K 3.8 3.6  CL 99 106  CO2 25 23  GLUCOSE 125* 89  BUN 19 14  CREATININE 0.75 0.48  CALCIUM 9.5 8.9  MG  --  2.0   Liver Function Tests: Recent Labs  Lab 07/17/22 1925 07/18/22 0455  AST 31 23  ALT 29 24  ALKPHOS 111 89  BILITOT 0.4 0.4  PROT 8.2* 7.3  ALBUMIN 3.0* 2.7*   No results for input(s): "LIPASE", "AMYLASE" in the last 168 hours. No results for input(s): "AMMONIA" in the last 168 hours. CBC: Recent Labs  Lab 07/17/22 1925 07/18/22 0813 07/19/22  0601  WBC 12.4* 8.2 12.7*  NEUTROABS 9.4* 5.3 11.5*  HGB 10.9* 10.5* 11.1*  HCT 34.1* 33.8* 34.1*  MCV 91.2 91.8 89.7  PLT 358 322 346   Cardiac Enzymes: No results for input(s): "CKTOTAL", "CKMB", "CKMBINDEX", "TROPONINI" in the last 168 hours. BNP: Invalid input(s): "POCBNP" CBG: No results for input(s): "GLUCAP" in the last 168 hours. D-Dimer No results for input(s): "DDIMER" in the last 72 hours. Hgb A1c No results for input(s): "HGBA1C" in  the last 72 hours. Lipid Profile No results for input(s): "CHOL", "HDL", "LDLCALC", "TRIG", "CHOLHDL", "LDLDIRECT" in the last 72 hours. Thyroid function studies No results for input(s): "TSH", "T4TOTAL", "T3FREE", "THYROIDAB" in the last 72 hours.  Invalid input(s): "FREET3" Anemia work up No results for input(s): "VITAMINB12", "FOLATE", "FERRITIN", "TIBC", "IRON", "RETICCTPCT" in the last 72 hours. Urinalysis    Component Value Date/Time   COLORURINE YELLOW 01/26/2020 1106   APPEARANCEUR CLEAR 01/26/2020 1106   LABSPEC 1.028 01/26/2020 1106   PHURINE 5.0 01/26/2020 1106   GLUCOSEU NEGATIVE 01/26/2020 1106   HGBUR NEGATIVE 01/26/2020 1106   BILIRUBINUR NEGATIVE 01/26/2020 1106   KETONESUR 5 (A) 01/26/2020 1106   PROTEINUR NEGATIVE 01/26/2020 1106   NITRITE NEGATIVE 01/26/2020 1106   LEUKOCYTESUR NEGATIVE 01/26/2020 1106   Sepsis Labs Recent Labs  Lab 07/17/22 1925 07/18/22 0813 07/19/22 0601  WBC 12.4* 8.2 12.7*   Microbiology Recent Results (from the past 240 hour(s))  Resp panel by RT-PCR (RSV, Flu A&B, Covid) Anterior Nasal Swab     Status: None   Collection Time: 07/17/22  7:27 PM   Specimen: Anterior Nasal Swab  Result Value Ref Range Status   SARS Coronavirus 2 by RT PCR NEGATIVE NEGATIVE Final    Comment: (NOTE) SARS-CoV-2 target nucleic acids are NOT DETECTED.  The SARS-CoV-2 RNA is generally detectable in upper respiratory specimens during the acute phase of infection. The lowest concentration of SARS-CoV-2 viral copies this assay can detect is 138 copies/mL. A negative result does not preclude SARS-Cov-2 infection and should not be used as the sole basis for treatment or other patient management decisions. A negative result may occur with  improper specimen collection/handling, submission of specimen other than nasopharyngeal swab, presence of viral mutation(s) within the areas targeted by this assay, and inadequate number of viral copies(<138 copies/mL).  A negative result must be combined with clinical observations, patient history, and epidemiological information. The expected result is Negative.  Fact Sheet for Patients:  EntrepreneurPulse.com.au  Fact Sheet for Healthcare Providers:  IncredibleEmployment.be  This test is no t yet approved or cleared by the [place] FDA and  has been authorized for detection and/or diagnosis of SARS-CoV-2 by FDA under an Emergency Use Authorization (EUA). This EUA will remain  in effect (meaning this test can be used) for the duration of the COVID-19 declaration under Section 564(b)(1) of the Act, 21 U.S.C.section 360bbb-3(b)(1), unless the authorization is terminated  or revoked sooner.       Influenza A by PCR NEGATIVE NEGATIVE Final   Influenza B by PCR NEGATIVE NEGATIVE Final    Comment: (NOTE) The Xpert Xpress SARS-CoV-2/FLU/RSV plus assay is intended as an aid in the diagnosis of influenza from Nasopharyngeal swab specimens and should not be used as a sole basis for treatment. Nasal washings and aspirates are unacceptable for Xpert Xpress SARS-CoV-2/FLU/RSV testing.  Fact Sheet for Patients: EntrepreneurPulse.com.au  Fact Sheet for Healthcare Providers: IncredibleEmployment.be  This test is not yet approved or cleared by the [place] and  has been authorized for detection and/or diagnosis of SARS-CoV-2 by FDA under an Emergency Use Authorization (EUA). This EUA will remain in effect (meaning this test can be used) for the duration of the COVID-19 declaration under Section 564(b)(1) of the Act, 21 U.S.C. section 360bbb-3(b)(1), unless the authorization is terminated or revoked.     Resp Syncytial Virus by PCR NEGATIVE NEGATIVE Final    Comment: (NOTE) Fact Sheet for Patients: EntrepreneurPulse.com.au  Fact Sheet for Healthcare  Providers: IncredibleEmployment.be  This test is not yet approved or cleared by the [place] FDA and has been authorized for detection and/or diagnosis of SARS-CoV-2 by FDA under an Emergency Use Authorization (EUA). This EUA will remain in effect (meaning this test can be used) for the duration of the COVID-19 declaration under Section 564(b)(1) of the Act, 21 U.S.C. section 360bbb-3(b)(1), unless the authorization is terminated or revoked.  Performed at [facility], [address], [place], [place] [address]   Culture, blood (Routine X 2) w Reflex to ID Panel     Status: None (Preliminary result)   Collection Time: 07/17/22 10:40 PM   Specimen: BLOOD  Result Value Ref Range Status   Specimen Description BLOOD BLOOD RIGHT HAND  Final   Special Requests   Final    BOTTLES DRAWN AEROBIC AND ANAEROBIC Blood Culture adequate volume   Culture   Final    NO GROWTH 2 DAYS Performed at [facility], [address], [place], [place] [address]    Report Status PENDING  Incomplete  Culture, blood (Routine X 2) w Reflex to ID Panel     Status: None (Preliminary result)   Collection Time: 07/17/22 10:41 PM   Specimen: BLOOD  Result Value Ref Range Status   Specimen Description BLOOD LEFT ANTECUBITAL  Final   Special Requests   Final    BOTTLES DRAWN AEROBIC AND ANAEROBIC Blood Culture adequate volume   Culture   Final    NO GROWTH 2 DAYS Performed at [facility], [address] [place], [place] [address]    Report Status PENDING  Incomplete  Respiratory (~20 pathogens) panel by PCR     Status: None   Collection Time: 07/18/22 11:15 AM   Specimen: Nasopharyngeal Swab; Respiratory  Result Value Ref Range Status   Adenovirus NOT DETECTED NOT DETECTED Final   Coronavirus 229E NOT DETECTED NOT DETECTED Final    Comment: (NOTE) The Coronavirus on the Respiratory Panel, DOES NOT test for the novel  Coronavirus (2019 nCoV)    Coronavirus HKU1 NOT DETECTED NOT  DETECTED Final   Coronavirus NL63 NOT DETECTED NOT DETECTED Final   Coronavirus OC43 NOT DETECTED NOT DETECTED Final   Metapneumovirus NOT DETECTED NOT DETECTED Final   Rhinovirus / Enterovirus NOT DETECTED NOT DETECTED Final   Influenza A NOT DETECTED NOT DETECTED Final   Influenza B NOT DETECTED NOT DETECTED Final   Parainfluenza Virus 1 NOT DETECTED NOT DETECTED Final   Parainfluenza Virus 2 NOT DETECTED NOT DETECTED Final   Parainfluenza Virus 3 NOT DETECTED NOT DETECTED Final   Parainfluenza Virus 4 NOT DETECTED NOT DETECTED Final   Respiratory Syncytial Virus NOT DETECTED NOT DETECTED Final   Bordetella pertussis NOT DETECTED NOT DETECTED Final   Bordetella Parapertussis NOT DETECTED NOT DETECTED Final   Chlamydophila pneumoniae NOT DETECTED NOT DETECTED Final   Mycoplasma pneumoniae NOT DETECTED NOT DETECTED Final    Comment: Performed at [facility], [place] [address], [place], [place] [address]    Time coordinating  discharge: 42 mins   SIGNED:  Irwin Brakeman, MD  [facility] 07/19/2022, 10:58 AM How to contact the [facility] Attending or Consulting provider [place] or covering provider during after hours [place], for this patient?  Check the care team in [facility] and look for a) attending/consulting [facility] provider listed and b) the [facility] team listed Log into [email or web address] and use [place]'s universal password to access. If you do not have the password, please contact the hospital operator. Locate the [facility] provider you are looking for under [facility] and page to a number that you can be directly reached. If you still have difficulty reaching the provider, please page the [facility] (Director on Call) for the Hospitalists listed on amion for assistance.

## 2022-07-19 NOTE — Discharge Instructions (Addendum)
Please make an appointment with your ENT physician Dr. Wilburn Cornelia as soon as possible regarding your severe sinus disease.  Please follow-up with your pulmonologist as scheduled on 07/28/2022.  Please follow-up with your allergist as soon as possible.  Dr. Melvyn Novas left these specific instructions regarding your treatment.  Take prednisone 40 mg daily for 3 days followed by 20 mg daily for 6 days followed by 10 mg daily for 5 days.  Take Augmentin for full 21-day course of treatment  Please continue to take probiotics daily especially while you are on antibiotic treatment.  Please use your nebulizer treatments and avoid inhalers for now as they can increase coughing.       IMPORTANT INFORMATION: PAY CLOSE ATTENTION   PHYSICIAN DISCHARGE INSTRUCTIONS  Follow with Primary care provider  Pomposini, Cherly Anderson, MD  and other consultants as instructed by your Hospitalist Physician  [place] IF SYMPTOMS COME BACK, WORSEN OR NEW PROBLEM DEVELOPS   Please note: You were cared for by a hospitalist during your hospital stay. Every effort will be made to forward records to your primary care provider.  You can request that your primary care provider send for your hospital records if they have not received them.  Once you are discharged, your primary care physician will handle any further medical issues. Please note that NO REFILLS for any discharge medications will be authorized once you are discharged, as it is imperative that you return to your primary care physician (or establish a relationship with a primary care physician if you do not have one) for your post hospital discharge needs so that they can reassess your need for medications and monitor your lab values.  Please get a complete blood count and chemistry panel checked by your Primary MD at your next visit, and again as instructed by your Primary MD.  Get Medicines reviewed and adjusted: Please take all  your medications with you for your next visit with your Primary MD  Laboratory/radiological data: Please request your Primary MD to go over all hospital tests and procedure/radiological results at the follow up, please ask your primary care provider to get all Hospital records sent to his/her office.  In some cases, they will be blood work, cultures and biopsy results pending at the time of your discharge. Please request that your primary care provider follow up on these results.  If you are diabetic, please bring your blood sugar readings with you to your follow up appointment with primary care.    Please call and make your follow up appointments as soon as possible.    Also Note the following: If you experience worsening of your admission symptoms, develop shortness of breath, life threatening emergency, suicidal or homicidal thoughts you must seek medical attention immediately by calling 911 or calling your MD immediately  if symptoms less severe.  You must read complete instructions/literature along with all the possible adverse reactions/side effects for all the Medicines you take and that have been prescribed to you. Take any new Medicines after you have completely understood and accpet all the possible adverse reactions/side effects.   Do not drive when taking Pain medications or sleeping medications (Benzodiazepines)  Do not take more than prescribed Pain, Sleep and Anxiety Medications. It is not advisable to combine anxiety,sleep and pain medications without talking with your primary care practitioner  Special Instructions: If you have smoked or chewed Tobacco  in the last 2 yrs please stop smoking, stop any regular  Alcohol  and or any Recreational drug use.  Wear Seat belts while driving.  Do not drive if taking any narcotic, mind altering or controlled substances or recreational drugs or alcohol.

## 2022-07-19 NOTE — Progress Notes (Signed)
No acute events overnight. Pt is concerned about her prolonged/ recurrent illness and feels she needs IV antibiotics. Bryson Corona Edd Fabian

## 2022-07-22 LAB — CULTURE, BLOOD (ROUTINE X 2)
Culture: NO GROWTH
Culture: NO GROWTH
Special Requests: ADEQUATE
Special Requests: ADEQUATE

## 2022-07-23 ENCOUNTER — Ambulatory Visit: Payer: Medicare HMO | Attending: Internal Medicine | Admitting: Internal Medicine

## 2022-07-23 NOTE — Progress Notes (Signed)
Erroneous encounter - please disregard

## 2022-07-27 NOTE — Patient Instructions (Signed)
ICD-10-CM   1. Chronic rhinitis  J31.0     2. Recurrent sinus infections  J32.9     3. Multiple allergies  Z88.9     4. COPD, frequent exacerbations ([place])  J44.1     5. History of recent pneumonia  Z87.01     6. Quit smoking  Z87.891       February 2024 data indicates pneumonitis and also significant sinusitis as contributors for recurrent exacerbations.  Still unable to get allergy testing because of repeated prednisone courses  Glad you are better with current 21-day Augmentin course and current prednisone taper both of which are ongoing  Glad nebulizers helping you better than inhaler therapy  Plan - Refer ENT [we will see if you can be seen sooner by [facility]) -Refer to allergist -Repeat CT scan of the chest high-resolution in 3 months to ensure resolution of current infiltrates -Continue DuoNeb 4 times daily plus as needed - Start Pulmicort nebulizer 2 times daily - Continue with smoking remission and exposure to chicken coop  Follow-up - Return in 6 weeks to see nurse practitioner - Return in 12 weeks to see Dr. Chase Caller but after CT scan of the chest

## 2022-07-27 NOTE — Progress Notes (Unsigned)
Subjective:     Patient ID: Jenna Hancock, female   DOB: 11-24-1960, 62 y.o.   MRN: [id number]  HPI PCP Pomposini, Cherly Anderson, MD   HPI   IOV 03/19/2017  Chief Complaint  Patient presents with   Advice Only    Abnormal cxr 01/22/17 and due to that, pt requested to come to our office for appt. C/o SOB on exertion with occ. dry cough and occ. chest tightness. Pt stated that in May and June she was cleaning her place out due to having rat infestation and became real sick then and hasn't felt the same since.   Jenna Hancock 10-23-1960 : 62 year old female whose mother Milanie Fest that I take care of for COPD. Patient herself is a smoker greater than 30 pack per day. But at baseline she has never had any respiratory issues. [place] with her boyfriend who is here with her today Then approximately the first week of May 2018: She took it upon herself to enter and closed small room that are not being visited in 1 year. The room contained chicken feed. There was significant rat infestation. She used a leaf blower without a mass and for 3 hours guarded of the rats. During this time a lot of rat feces she inhale and was exposed to. One day after this she started having fever and wheezing and shortness of breath and feeling extremely rundown. Then on 10/26/2016 She went to the ER in May 2018 with fever and generalized body aches following tick bite 2. She did not tell the emergency room doctors about exposure to rat feces because of embarrassment.  During this time wheezing was noticed on the physical exam. She was discharged on doxycycline. It looks like she did not get prednisone. After this she reports she's been to other urgent care but did not get prednisone and steroids. Then in June 2018 she saw primary care for chronic diarrhea At this visit she reported she lost 22 pounds. C. difficile test was negative. Then in 02/01/2017 she went to the emergency department because of  shortness of breath after mowing grass on a hot humid day. In the emergency room she did not have wheezing. She was discharged after albuterol and dexamethasone taper. She tells me that till this day that she is much better but she still has wheezing and shortness of breath and feeling fatigued and is not fully better. She is extremely worried about her respiratory issues    Chest x-ray 02/01/2017: Reported a COPD without any abnormality but to me on my personal visualization is only mildly hyperinflated but otherwise clear  Blood lab work 01/22/2017: Troponin normal, BMP normal, liver function test normal and creatinine normal and white count normal  Walking desaturation test 185 feet 3 laps on room air with a full head probe: Resting heart rate 57/m. Peak heart rate 85/m. Resting pulse ox 100%. Final pulse ox 99% and she became short of breath. She did not desaturate.   Exhaled nitric oxide in the office 03/19/2017: 7 ppb  Spirometry 03/19/2017 -> fev1 1/9L/70%, Ratio 76 - office spirometry  Past medical history review shows that she's had a history of pneumonia, sinus infections and sinus nasal surgeries.      06/11/2017 acute extended ov/Wert re:  Chief Complaint  Patient presents with   Acute Visit    Pt c/o increased SOB, wheezing, chest tightness and prod cough with minimal green sputum- onset was  6 days ago. She had fever 1 day ago.   cough off and on since May 2018 while on breo and using lots of saba / assoc chest tight better p saba  Shoemaker eval 2 week prior to [place]  Did not rec abx  Acute worse cough with purulent sputum esp in am since 06/05/17 assoc with nasal congestion/ sore throat/    sob with watery nasal d/c  rx pred / omnicef 12/21/- 12/26 and no better    No obvious day to day or daytime variability or assoc    mucus plugs or hemoptysis or cp or  or overt   hb symptoms. No unusual exposure hx or h/o childhood pna/ asthma or knowledge of premature  birth.  Sleeping ok flat without nocturnal    exacerbation  of respiratory  c/o's or need for noct saba. Also denies any obvious fluctuation of symptoms with weather or environmental changes or other aggravating or alleviating factors except as outlined above    OV 06/25/2017  Chief Complaint  Patient presents with   Follow-up    Pt was seen by [facility] 06/11/17 and was dx with bronchitis. Pt went to ENT, Dr. Wilburn Cornelia and is currently on an abx. Pt has complaints of a cough with yellow mucus. Denies any SOB or CP   Jenna Hancock returns for follow-up.  She has respiratory symptoms following rat  antigen exposure in May 2018.  She continues to smoke and she continues to have respiratory symptoms.  Around Christmas 2018 she started feeling sick.  June 11, 2017 she saw my colleague Dr. Christinia Gully who advised her to stop smoking given antibiotic and prednisone.  Did a chest x-ray that I personally visualized that shows bronchitic changes but also my opinion that might be interstitial lung disease changes.  She started feeling better with and followed up with ENT doctor Dr. Wilburn Cornelia who has her on prolonged antibiotic therapy right now.  Overall she is better but she feels stable.  Nevertheless she feels she is a new new low baseline.  She feels fatigued all the time.  She is frustrated with  symptomatology but she continues to smoke.   PFT 04/22/17 - normal FENO 12 ppb 06/25/2017   OV 11/23/2017  Chief Complaint  Patient presents with   Follow-up    Pt states she has been doing well since last visit and denies any complaints or concerns.    Jenna Hancock , 62 y.o. , with dob 06-20-1960 and female ,Not Hispanic or Latino from [address] [place] [address] - presents to pulm clinic for cough and resp symptoms due to smoking and chicken coop expisure with rapid antigen.  At the end of last visit in January 2019 we did a high-resolution CT scan of the chest and this shows RB ILD findings.   Autoimmune and hypersensitivity pneumonitis panel was negative.  Currently she says she has cut down on smoking and with this her respiratory symptoms have improved.  In addition she also has cut down exposure to the chicken coop.  In fact she has eliminated this.  She still has some symptoms that are described below on the CAT score even though she does not have COPD.  The symptoms are relatively new compared to the pre-exposure days but they are improving.  Albuterol does help this.  We discussed quitting smoking because she still continues to smoke.  Several years ago she quit with the help of Chantix for 1-1/2 years but then  relapsed.  She says Chantix because her $200 a month while she is spending only 25 or $30 a month for smoking.  She is try to get a lower price on the Chantix.  Apparently a friend of hers is trying to help her with the discount program.  We did give her a discount card.       OV 08/17/2018  Subjective:  Patient ID: Jenna Hancock, female , DOB: 07-08-1960 , age 75 y.o. , MRN: [id number] , ADDRESS: [place] [id number]   08/17/2018 -   Chief Complaint  Patient presents with   Follow-up    Pt states she has been doing well since last visit and denies any concerns.  FU smoking FU RB-ILD  HPI Jenna Hancock 62 y.o. -returns for follow-up.  Last seen June 2019.  With the help of Chantix she quit smoking but the remission last 3 months and approximately 2 months ago she started smoking a cigarette here and there when she tapered her Chantix down to once a day.  Now for the last 2 weeks she is gone back up on the Chantix to twice daily and smoking has been in remission.  Nevertheless these efforts have yielded significant improvement in symptoms and she feels good.  She is trying to avoid the chicken coop.  She reports no new problems.  Chart review shows that she had coronary artery calcification on the previous CT scan of the chest but she denies any chest pain or  shortness of breath or wheezing or cough.    CAT COPD Symptom & Quality of Life Score (GSK trademark) 0 is no burden. 5 is highest burden 11/23/2017   Never Cough -> Cough all the time 3  No phlegm in chest -> Chest is full of phlegm 1  No chest tightness -> Chest feels very tight 2  No dyspnea for 1 flight stairs/hill -> Very dyspneic for 1 flight of stairs 3  No limitations for ADL at home -> Very limited with ADL at home 0  Confident leaving home -> Not at all confident leaving home -0  Sleep soundly -> Do not sleep soundly because of lung condition 2  Lots of Energy -> No energy at all 2  TOTAL Score (max 40)  13    CT chest IMPRESSION: 1. Minimal patchy ground-glass centrilobular micronodularity in the upper lobes. 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 2. Mild to moderate patchy air trapping in both lungs, indicative of small airways disease. 3. One vessel coronary atherosclerosis. 4. Cholelithiasis.   Aortic Atherosclerosis (ICD10-I70.0).     Electronically Signed   By: Ilona Sorrel M.D.   On: 07/07/2017 17:08  ROS - per HPI   OV 02/15/2021  Subjective:  Patient ID: Jenna Hancock, female , DOB: Dec 16, 1960 , age 54 y.o. , MRN: [id number] , ADDRESS: [place] [id number] PCP Dannial Monarch, FNP Patient Care Team: Dannial Monarch, FNP as PCP - General (Nurse Practitioner)  This Provider for this visit: Treatment Team:  Attending Provider: Brand Males, MD    02/15/2021 -   Chief Complaint  Patient presents with   Follow-up    Pt states she has been doing okay since last visit and denies any complaints.     HPI Jenna Hancock 62 y.o. -returns for follow-up of her smoking and RB ILD upper lobe groundglass nodules.  Last CT scan was in 2019.  She had a CT scan of the chest in 2022.  Radiology did not compare but according to the report it seems similar without any  change.  She is denying any chest pain or shortness of breath or coughing or wheezing.  She is relapsed into smoking.  She wants another Chantix prescription if it still available on the market.  I will make this.  She denies any suicidal or homicidal ideations.  Denies any nightmares prior with Chantix.  No firearms in the house.  Of note her mother had COVID.  Be treated with antiviral.  Now she saying the mother has green nasal drainage for the last day.  She is wondering about antibiotics because the mother is also coughing more from the nasal drainage.  I made a separate phone note about that for Cindee Salt.    CT Chest data 7/322  Narrative & Impression  CLINICAL DATA:  Pneumonia.   EXAM: CT CHEST WITHOUT CONTRAST   TECHNIQUE: Multidetector CT imaging of the chest was performed following the standard protocol without IV contrast.   COMPARISON:  Radiograph same day   FINDINGS: Cardiovascular: No significant vascular findings. Normal heart size. No pericardial effusion. Scattered calcifications of the thoracic aorta.   Mediastinum/Nodes: Paratracheal lymph nodes are prominent but not pathologically enlarged 6 7 mm. Supraclavicular nodes.   Lungs/Pleura: There is bilateral patchy ground-glass opacities within the upper and lower lobes. No discrete nodularity. No pleural fluid. Airways normal.   Upper Abdomen: Limited view of the liver, kidneys, pancreas are unremarkable. Normal adrenal glands.   Musculoskeletal: No aggressive osseous lesion.   IMPRESSION: 1. Bilateral patchy ground-glass opacities without consolidation or nodularity. Nonspecific finding with differential including pulmonary edema, multifocal atypical pneumonia including viral pneumonia or pneumonitis. 2. Small paratracheal lymph nodes are presumably reactive.     Electronically Signed   By: Suzy Bouchard M.D.   On: 12/16/2020 13:52      OV 12/03/2021  Subjective:  Patient ID: Jenna Hancock, female , DOB: [birth year] , age 14 y.o. , MRN: [id number] , ADDRESS: [place] [id number] PCP Pomposini, Cherly Anderson, MD Patient Care Team: Pomposini, Cherly Anderson, MD as PCP - General (Internal Medicine) Donato Heinz, MD as PCP - Cardiology (Cardiology)  This Provider for this visit: Treatment Team:  Attending Provider: Brand Males, MD    12/03/2021 -   Chief Complaint  Patient presents with   Follow-up    Pt states she is doing much better compared to last visit.    I Jenna Hancock 62 y.o. -returns for follow-up.  Since her last saw her she saw my colleague Dr. Prudence Davidson and was admitted for rhinovirus related COPD exacerbation multifocal viral pneumonia.  According to husband she lets her sinus infections linger too long and not seek early treatment.  I am meeting him for the first time.  He wanted to expressed the sentiment specifically.  She does admit to this.  In addition she does get exposed to the chicken coop.  She says she is cut down on the exposures and wears a mask but still she does it once in every 3 months.  However she has quit smoking 1 week before the hospitalization and the smoking is still in remission.  Review of the medication shows she is on Stiolto but also taking omega-3 fatty oil.  She denies any active acid reflux.  I did explain to her omega-3 fatty oil can cause acid reflux.  This is a  risk factor for flareups of COPD or lung disease.  We discussed about sinus hygiene measures.  She had a high-resolution CT chest that shows near resolution of her pulmonary infiltrates.  No obvious emphysema described    CT Chest data - HRCT 11/27/21  IMPRESSION: 1. Previously seen widespread, somewhat geographic airspace opacity throughout the lungs is almost completely resolved, with only scattered areas of residual ground-glass, for example in the left apex and in the superior segment left lower lobe. Findings are consistent with resolution of  infection or inflammation. 2. No residual evidence of fibrotic interstitial lung disease. 3. Mild lobular air trapping on expiratory phase imaging, suggestive of small airways disease. 4. Coronary artery disease. 5. Cholelithiasis.   Aortic Atherosclerosis (ICD10-I70.0).     Electronically Signed   By: Delanna Ahmadi M.D.   On: 11/29/2021 12:47  No results found.    PFT  OV 03/06/2022  Subjective:  Patient ID: Jenna Hancock, female , DOB: [birth year] , age 89 y.o. , MRN: [id number] , ADDRESS: [place] [id number] PCP Pomposini, Cherly Anderson, MD Patient Care Team: Pomposini, Cherly Anderson, MD as PCP - General (Internal Medicine) Donato Heinz, MD as PCP - Cardiology (Cardiology)  This Provider for this visit: Treatment Team:  Attending Provider: Brand Males, MD  [facility]  03/06/2022 -   Chief Complaint  Patient presents with   Follow-up    PFT performed today.  Pt states she has been doing okay since last visit. States she is feeling better compared to last visit. Has a mild cough.     HPI Jenna Hancock 62 y.o. -returns for follow-up.  She is in the middle of another exacerbation.  She is on antibiotic Augmentin and also prednisone.  She feels remarkably better.  She states she has nearly quit smoking but does admit that she smoking a few puffs here and there.  She also continues to get exposed to the chicken coop even though she says she wears a mask.  She had pulmonary function test.  The previous one was in 2018.  This marked decline in the pulmonary function testing.  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  DLCO still normal.  Her June 2023 CT chest showed resolution of her RB ILD.  There is no emphysema reported but she is behaving like COPD.  She has quit her fish oil upon my advice.  Nevertheless she still getting frequent exacerbations.  She is on Stiolto.  She is not on Daliresp and her mother is on it.  She asked me about this medication.  She is  also not on triple inhaler therapy.    CT Chest data - HRCT 11/27/21  Narrative & Impression  CLINICAL DATA:  Interstitial lung disease, follow-up pneumonia   EXAM: CT CHEST WITHOUT CONTRAST   TECHNIQUE: Multidetector CT imaging of the chest was performed following the standard protocol without intravenous contrast. High resolution imaging of the lungs, as well as inspiratory and expiratory imaging, was performed.   RADIATION DOSE REDUCTION: This exam was performed according to the departmental dose-optimization program which includes automated exposure control, adjustment of the mA and/or kV according to patient size and/or use of iterative reconstruction technique.   COMPARISON:  10/09/2021   FINDINGS: Cardiovascular: Aortic atherosclerosis. Normal heart size. Scattered left coronary artery calcifications. No pericardial effusion.   Mediastinum/Nodes: No enlarged mediastinal, hilar, or axillary lymph nodes. Thyroid gland, trachea, and esophagus demonstrate no significant findings.   Lungs/Pleura: Previously seen  widespread, somewhat geographic airspace opacity throughout the lungs is almost completely resolved, with only scattered areas of residual ground-glass, for example in the left apex (series 6, image 25) and in the superior segment left lower lobe (series 6, image 112). Mild lobular air trapping on expiratory phase imaging. No pleural effusion or pneumothorax.   Upper Abdomen: No acute abnormality.  Rim calcified gallstone.   Musculoskeletal: No chest wall abnormality. No suspicious osseous lesions identified.   IMPRESSION: 1. Previously seen widespread, somewhat geographic airspace opacity throughout the lungs is almost completely resolved, with only scattered areas of residual ground-glass, for example in the left apex and in the superior segment left lower lobe. Findings are consistent with resolution of infection or inflammation. 2. No residual evidence  of fibrotic interstitial lung disease. 3. Mild lobular air trapping on expiratory phase imaging, suggestive of small airways disease. 4. Coronary artery disease. 5. Cholelithiasis.   Aortic Atherosclerosis (ICD10-I70.0).     Electronically Signed   By: Delanna Ahmadi M.D.   On: 11/29/2021 12:47    No results found.     OV 06/05/2022  Subjective:  Patient ID: Jenna Hancock, female , DOB: [birth year] , age 64 y.o. , MRN: [id number] , ADDRESS: [address] [place] [id number] PCP Pomposini, Cherly Anderson, MD Patient Care Team: Pomposini, Cherly Anderson, MD as PCP - General (Internal Medicine) Freada Bergeron, MD as PCP - Cardiology (Cardiology)  This Provider for this visit: Treatment Team:  Attending Provider: Brand Males, MD    06/05/2022 -   Chief Complaint  Patient presents with   Follow-up    F/up had strept, coughing up yellowish mucous, sinus pressure and ear clog on right side.      HPI Jenna Hancock 62 y.o. -returns for follow-up.  She says she has been sick again since November 2023.  She has quit smoking.  She has reduced the exposure to chicken coop.  Despite this she is still getting repeated sinus infection and respiratory bronchitis.  Positive confirmation of strep throat and then she has been on antibiotics and prednisone.  Currently finishing a course of prednisone.  She has been on antibiotics and prednisone at least 3 times since she last saw me.  Her mom was hospitalized for delirium and sepsis.  Her boyfriend was hospitalized for orthopedic issues.  She has upcoming sinus CT in January 2024 at [facility].  She also got an allergy appointment pending.  Review of the records indicate that we do not have a RAST allergy immune panel on her.  We also do not have a sinus CT on her.  She is currently on Breztri and Roflumilast.   OV 07/28/2022  Subjective:  Patient ID: Jenna Hancock, female , DOB: [birth year] , age 1 y.o. , MRN: [id number] , ADDRESS: [place] [phone] PCP Pomposini, Cherly Anderson, MD Patient Care Team: Clinton Quant, MD as PCP - General (Internal Medicine) Freada Bergeron, MD as PCP - Cardiology (Cardiology)  This Provider for this visit: Treatment Team:  Attending Provider: Brand Males, MD   RB-ILD  - resolved in June 2023 high-res CT chest. Smoking Chicken COOP Exposure Behaves like COPD with recc exacerbation because of previous smoking  but no eviddnce of copd  - CT June 2023: no emphysema but has air trapping -with recurrent exacerbation, family history, exacerbations with wheeze  -PFT 2018 that was normal and no emphysema described  - PFT sept 2-023 with restrciton and normal  DLCO  - normal IgE Oct 2023  - eos 2022 in Oct 2023  - normal HP Panel 2019   07/28/2022 -   Chief Complaint  Patient presents with   Follow-up    Pt is here for follow up for cough. Pt states she has 1 more week of Augmentin due to PNA from hospital visit back on 07/17/2022. Pt is also on a taper of prednisone. She states her cough is doing a little better. Pt states that the hospital told her to quit the breztri and to do the breathing treatments. Pt states the nebulizer are working better for her.      HPI Jenna Hancock 62 y.o. -last seen in December 2023.  At that visit we wanted to order a RAST allergy profile but after she came off prednisone.  We also wanted to get a CT scan of the sinus.  However the holidays and her sickness this has not been accomplished.  Interim 07/17/2022 she ended up in the hospital for 2 days and discharged on 07/19/2022.  She ended up with a respiratory exacerbation.  CT scan of the sinus at the time showed significant air-fluid levels.  This is consistent with our suspicion of chronic sinusitis.  RAST allergy profile still has not been done because she is on prednisone.  She did have blood eosinophils in the hospital and this was normal.  He did have a CT chest that shows  multifocal patchy groundglass opacities.  Dr. Christinia Gully saw her.  Has been given 21 days of Augmentin and also an extended prednisone taper.  At this point in time she is still on Augmentin and prednisone.  She says she is feeling remarkably better.  She says she got really scared.  She is wondering why her treatments not optimized.  She is wondering if the situations are permanent.  She is wondering if her immune system is weak.  She is also changed her nebulizer which she believes is helping her.  I noticed that she is only on DuoNeb.  She is no longer on Pristiq.  She not on any steroid nebulizer.    We did discuss the fact that her investigations still in progress but because of her repeated exacerbations and not being able to do RAST allergy panel.  Did remind her that CT scan sinus was ordered but she ended up in the hospital which confirmed the presence of chronic sinusitis as previously suspected.  Also informed that respiratory infiltrates have come back.  She is understanding of all this.  I reviewed her discharge hospital records  CT Chest data 07/17/22   IMPRESSION: Multifocal patchy/ground-glass opacities, upper lung predominant, favoring multifocal pneumonia in this clinical setting. In the absence of infectious symptoms, inflammatory etiologies such as hypersensitivity pneumonitis could have a similar appearance.   Mild mediastinal lymphadenopathy, likely reactive.   Cholelithiasis, without associated inflammatory changes.     Electronically Signed   By: Julian Hy M.D.   On: 07/17/2022 21:15    No results found.    PFT     Latest Ref Rng & Units 03/06/2022    9:02 AM 04/22/2017   10:52 AM  PFT Results  FVC-Pre L 1.91  2.73   FVC-Predicted Pre % 59  81   FVC-Post L 2.07  2.72   FVC-Predicted Post % 64  81   Pre FEV1/FVC % % 75  78   Post FEV1/FCV % % 82  78   FEV1-Pre L 1.44  2.13   FEV1-Predicted Pre % 57  82   FEV1-Post L 1.69  2.11   DLCO  uncorrected ml/min/mmHg 17.37  20.04   DLCO UNC% % 87  85   DLCO corrected ml/min/mmHg 17.37  20.04   DLCO COR %Predicted % 87  85   DLVA Predicted % 99  88   TLC L 5.84  5.16   TLC % Predicted % 117  103   RV % Predicted % 185  121        has a past medical history of Anxiety, Arthritis, Depression, Essential hypertension, benign (12/08/2016), GERD (gastroesophageal reflux disease), Heart murmur, High cholesterol (12/08/2016), Hyperlipidemia, Hypertension, PONV (postoperative nausea and vomiting), Recurrent upper respiratory infection (URI), Sleep apnea, Stroke ([place]), and TIA (transient ischemic attack) (2017).   reports that she quit smoking about 9 months ago. Her smoking use included cigarettes. She started smoking about 41 years ago. She has a 34.00 pack-year smoking history. She has never used smokeless tobacco.  Past Surgical History:  Procedure Laterality Date   APPENDECTOMY     BIOPSY  12/10/2021   Procedure: BIOPSY;  Surgeon: Harvel Quale, MD;  Location: [facility];  Service: Gastroenterology;;   COLONOSCOPY WITH PROPOFOL N/A 12/10/2021   Procedure: COLONOSCOPY WITH PROPOFOL;  Surgeon: Harvel Quale, MD;  [facility]: [facility];  Service: Gastroenterology;  Laterality: N/A;  815   JOINT REPLACEMENT     rt knee 11 yrs ago   KNEE ARTHROSCOPY Left    NASAL SINUS SURGERY     POLYPECTOMY  12/10/2021   Procedure: POLYPECTOMY;  Surgeon: Harvel Quale, MD;  [facility]: [facility];  Service: Gastroenterology;;   TOTAL KNEE ARTHROPLASTY Left 02/01/2020   Procedure: TOTAL KNEE ARTHROPLASTY;  Surgeon: Susa Day, MD;  Location: [facility];  Service: Orthopedics;  Laterality: Left;  general or spinal    Allergies  Allergen Reactions   Bactrim Rash   Sulfa Antibiotics Rash    Immunization History  Administered Date(s) Administered   Influenza Inj Mdck Quad Pf 03/14/2019   Influenza Inj Mdck Quad With Preservative 04/05/2018    Influenza,inj,Quad PF,6+ Mos 04/16/2014, 04/16/2017   Influenza,inj,quad, With Preservative 04/20/2017, 04/05/2018, 03/14/2019, 04/20/2020, 04/10/2021   Influenza-Unspecified 04/25/2015, 04/05/2022   Moderna Sars-Covid-2 Vaccination 08/15/2019, 09/15/2019, 04/16/2020   PNEUMOCOCCAL CONJUGATE-20 05/16/2021   Pneumococcal Polysaccharide-23 02/24/2019   Tdap 02/16/2012   Zoster Recombinat (Shingrix) 03/08/2018   Zoster, Unspecified 03/08/2018    Family History  Problem Relation Age of Onset   Lung cancer Mother    Diabetes Father    Stroke Father    Heart disease Father    Asthma Son      Current Outpatient Medications:    ALPRAZolam (XANAX) 0.5 MG tablet, Take 0.5 mg by mouth 2 (two) times daily as needed for anxiety. , Disp: , Rfl:    amLODipine (NORVASC) 5 MG tablet, Take 5 mg by mouth daily., Disp: , Rfl:    amoxicillin-clavulanate (AUGMENTIN) 875-125 MG tablet, Take 1 tablet by mouth every 12 (twelve) hours for 20 days., Disp: 40 tablet, Rfl: 0   aspirin 325 MG tablet, Take 325 mg by mouth at bedtime., Disp: , Rfl:    atorvastatin (LIPITOR) 80 MG tablet, Take 80 mg by mouth daily., Disp: , Rfl:    azelastine (ASTELIN) 0.1 % nasal spray, Place 1 spray into both nostrils 2 (two) times daily. Use in each nostril as directed, Disp: 30 mL, Rfl: 5   benzonatate (TESSALON) 200 MG capsule, Take  1 capsule (200 mg total) by mouth 3 (three) times daily as needed for cough., Disp: 30 capsule, Rfl: 1   cetirizine (ZYRTEC ALLERGY) 10 MG tablet, Take 1 tablet (10 mg total) by mouth daily as needed for allergies., Disp: 30 tablet, Rfl: 5   ezetimibe (ZETIA) 10 MG tablet, Take 10 mg by mouth every evening., Disp: , Rfl:    FLUoxetine (PROZAC) 40 MG capsule, Take 80 mg by mouth daily., Disp: , Rfl:    fluticasone (FLONASE) 50 MCG/ACT nasal spray, Place 2 sprays into both nostrils daily., Disp: 16 g, Rfl: 5   folic acid (FOLVITE) Q000111Q MCG tablet, Take 800 mcg by mouth daily., Disp: , Rfl:     furosemide (LASIX) 20 MG tablet, Take 20 mg by mouth daily., Disp: , Rfl:    HYDROcodone-acetaminophen (NORCO) 10-325 MG tablet, Take 1 tablet by mouth in the morning and at bedtime., Disp: , Rfl:    ipratropium-albuterol (DUONEB) 0.5-2.5 (3) MG/3ML SOLN, Take 3 mLs by nebulization 4 (four) times daily., Disp: 360 mL, Rfl: 2   metoprolol tartrate (LOPRESSOR) 25 MG tablet, Take 25 mg by mouth 2 (two) times daily., Disp: , Rfl:    Multiple Vitamins-Minerals (MULTIVITAMIN WITH MINERALS) tablet, Take 1 tablet by mouth daily., Disp: , Rfl:    OVER THE COUNTER MEDICATION, Take 1 capsule by mouth daily. Fish Oil, Disp: , Rfl:    pantoprazole (PROTONIX) 40 MG tablet, Take 1 tablet (40 mg total) by mouth daily., Disp: , Rfl:    predniSONE (DELTASONE) 10 MG tablet, Take 4 tablets (40 mg total) by mouth daily for 3 days, THEN 2 tablets (20 mg total) daily for 6 days, THEN 1 tablet (10 mg total) daily for 6 days., Disp: 30 tablet, Rfl: 0   promethazine-dextromethorphan (PROMETHAZINE-DM) 6.25-15 MG/5ML syrup, Take 5 mLs by mouth 4 (four) times daily as needed for cough., Disp: 240 mL, Rfl: 0      Objective:   Vitals:   07/28/22 1028  BP: 128/70  Pulse: 72  Temp: 98.9 F (37.2 C)  TempSrc: Oral  SpO2: 97%  Weight: 202 lb (91.6 kg)  Height: 5' 4"$  (1.626 m)    Estimated body mass index is 34.67 kg/m as calculated from the following:   Height as of this encounter: 5' 4"$  (1.626 m).   Weight as of this encounter: 202 lb (91.6 kg).  @WEIGHTCHANGE$ @  [employer]   07/28/22 1028  Weight: 202 lb (91.6 kg)     Physical Exam    General: No distress. Looks well Neuro: Alert and Oriented x 3. GCS 15. Speech normal Psych: Pleasant Resp:  Barrel Chest - no.  Wheeze - no, Crackles - no, No overt respiratory distress CVS: Normal heart sounds. Murmurs - no Ext: Stigmata of Connective Tissue Disease - no HEENT: Normal upper airway. PEERL +. No post nasal drip        Assessment:        ICD-10-CM   1. Chronic rhinitis  J31.0     2. Recurrent sinus infections  J32.9     3. Multiple allergies  Z88.9     4. COPD, frequent exacerbations ([place])  J44.1     5. History of recent pneumonia  Z87.01     6. Quit smoking  Z87.891          Plan:     Patient Instructions     ICD-10-CM   1. Chronic rhinitis  J31.0     2. Recurrent sinus infections  J32.9     3. Multiple allergies  Z88.9     4. COPD, frequent exacerbations ([place])  J44.1     5. History of recent pneumonia  Z87.01     6. Quit smoking  Z87.891       February 2024 data indicates pneumonitis and also significant sinusitis as contributors for recurrent exacerbations.  Still unable to get allergy testing because of repeated prednisone courses  Glad you are better with current 21-day Augmentin course and current prednisone taper both of which are ongoing  Glad nebulizers helping you better than inhaler therapy  Plan - Refer ENT [we will see if you can be seen sooner by [facility]) -Refer to allergist -Repeat CT scan of the chest high-resolution in 3 months to ensure resolution of current infiltrates -Continue DuoNeb 4 times daily plus as needed - Start Pulmicort nebulizer 2 times daily - Continue with smoking remission and exposure to chicken coop  Follow-up - Return in 6 weeks to see nurse practitioner - Return in 12 weeks to see Dr. Chase Caller but after CT scan of the chest    SIGNATURE    Dr. Brand Males, M.D., F.C.C.P,  Pulmonary and Critical Care Medicine Staff Physician, [facility] Director - Interstitial Lung Disease  Program  P[facility] [place] [facility] [place], [place], [address]  Pager: [phone], If no answer or between  15:00h - 7:00h: call [phone] Telephone: [phone]  11:06 AM 07/28/2022

## 2022-07-28 ENCOUNTER — Encounter: Payer: Self-pay | Admitting: Internal Medicine

## 2022-07-28 ENCOUNTER — Ambulatory Visit: Payer: Medicare HMO | Admitting: Internal Medicine

## 2022-07-28 VITALS — BP 128/70 | HR 72 | Temp 98.9°F | Ht 64.0 in | Wt 202.0 lb

## 2022-07-28 DIAGNOSIS — Z8701 Personal history of pneumonia (recurrent): Secondary | ICD-10-CM

## 2022-07-28 DIAGNOSIS — J441 Chronic obstructive pulmonary disease with (acute) exacerbation: Secondary | ICD-10-CM

## 2022-07-28 DIAGNOSIS — Z889 Allergy status to unspecified drugs, medicaments and biological substances status: Secondary | ICD-10-CM | POA: Diagnosis not present

## 2022-07-28 DIAGNOSIS — J329 Chronic sinusitis, unspecified: Secondary | ICD-10-CM | POA: Diagnosis not present

## 2022-07-28 DIAGNOSIS — J310 Chronic rhinitis: Secondary | ICD-10-CM

## 2022-07-28 DIAGNOSIS — Z87891 Personal history of nicotine dependence: Secondary | ICD-10-CM

## 2022-07-28 MED ORDER — BUDESONIDE 0.5 MG/2ML IN SUSP: 0.5000 mg | Freq: Two times a day (BID) | RESPIRATORY_TRACT | 12 refills | Status: DC

## 2022-07-28 MED ORDER — IPRATROPIUM-ALBUTEROL 0.5-2.5 (3) MG/3ML IN SOLN: 3.0000 mL | Freq: Four times a day (QID) | RESPIRATORY_TRACT | 11 refills | Status: DC | PRN

## 2022-08-04 ENCOUNTER — Encounter: Payer: Self-pay | Admitting: Internal Medicine

## 2022-08-04 ENCOUNTER — Ambulatory Visit: Payer: Medicare HMO | Admitting: Internal Medicine

## 2022-08-04 VITALS — BP 104/52 | HR 79 | Temp 97.7°F | Resp 16

## 2022-08-04 DIAGNOSIS — B999 Unspecified infectious disease: Secondary | ICD-10-CM | POA: Diagnosis not present

## 2022-08-04 DIAGNOSIS — J3089 Other allergic rhinitis: Secondary | ICD-10-CM | POA: Diagnosis not present

## 2022-08-04 DIAGNOSIS — Z23 Encounter for immunization: Secondary | ICD-10-CM

## 2022-08-04 DIAGNOSIS — J328 Other chronic sinusitis: Secondary | ICD-10-CM

## 2022-08-04 MED ORDER — AZELASTINE HCL 0.1 % NA SOLN: 1.0000 | Freq: Two times a day (BID) | NASAL | 5 refills | Status: DC

## 2022-08-04 MED ORDER — FLUTICASONE PROPIONATE 50 MCG/ACT NA SUSP: 2.0000 | Freq: Every day | NASAL | 5 refills | Status: DC

## 2022-08-04 MED ORDER — CETIRIZINE HCL 10 MG PO TABS: 10.0000 mg | ORAL_TABLET | Freq: Every day | ORAL | 5 refills | Status: DC | PRN

## 2022-08-04 NOTE — Progress Notes (Signed)
FOLLOW UP Date of Service/Encounter:  08/04/22   Subjective:  Jenna Hancock (DOB: May 14, 1961) is a 62 y.o. female who returns to the [facility] on 08/04/2022 for follow up for recurrent infections and allergic rhinitis.   History obtained from: chart review and patient. Last visit was with me 06/23/2022 and had normal Ig and protein response but S pneumo titers were low. Informed to get Pneumovax with repeat titers in 6 weeks. Skin testing at the time showed reactivity to cockroach, mouse, mold mix #4 (fusarium, aureobasidium, rhizopus).   Since last visit, she was admitted to the hospital for pneumonia and therefore has not had a chance to get Pneumovax. She presented with 4-6 weeks of cough, body aches, rigors and using oxygen at home. PCP tried Doxycycline, Levaquin, Azithromycin without relief.  Outpatient imaging concerning for multifocal pneumonia. PCR Viral was negative, Blood cultures negative. Did not see a sputum culture.  CT sinus showed mild mucosal thickening and air fluid levels in bl maxillary and sphenoid sinuses. Discharged home on Augmentin and prednisone. She saw Dr. Chase Caller for follow up about 1 week ago and was referred to ENT and with plans for repeat CT. Since then, she reports doing better and has finished the prednisone and in a few days will finish the Augmentin. Still has some right sided congestion and ear pressure/fullness.  She is using Flonase daily and Azelastine daily and nasal rinses PRN.  Also on Zyrtec daily.    Past Medical History: Past Medical History:  Diagnosis Date   Anxiety    Arthritis    Depression    Essential hypertension, benign 12/08/2016   GERD (gastroesophageal reflux disease)    Heart murmur    AS A CHILD    High cholesterol 12/08/2016   Hyperlipidemia    Hypertension    PONV (postoperative nausea and vomiting)    Recurrent upper respiratory infection (URI)    Sleep apnea    DOES NOT USE CPAP    Stroke ([place])    HX  OF MINI STROKE 2016    TIA (transient ischemic attack) 2017    Objective:  BP (!) 104/52   Pulse 79   Temp 97.7 F (36.5 C)   Resp 16   SpO2 98%  There is no height or weight on file to calculate BMI. Physical Exam: GEN: alert, well developed HEENT: clear conjunctiva, TM grey and translucent, nose with moderate inferior turbinate hypertrophy, pink nasal mucosa, slight clear rhinorrhea, + cobblestoning HEART: regular rate and rhythm, no murmur LUNGS: clear to auscultation bilaterally, no coughing, unlabored respiration SKIN: no rashes or lesions  Labs: Negative autoimmune, hypersensitivity pneumonitis panel, a1 anti trypsin workup 06/2017 Negative HIV 4/20223  Assessment:   1. Recurrent infections   2. Perennial allergic rhinitis   3. Other chronic sinusitis     Plan/Recommendations:   Allergic Rhinitis Allergic Conjunctivitis  - Not convinced her allergen triggers of rhinitis would explain how severe her symptoms are and with the abnormal imaging findings on lung exam.  - Normal IgE and no skin testing reactivity to Aspergillus rules out ABPA.  - Positive skin test 06/2022: mouse, mold, cockroach.   - Avoidance measures discussed. - Use nasal saline rinses before nose sprays such as with Neilmed Sinus Rinse.  Use distilled water.   - Use Flonase 2 sprays each nostril daily. Aim upward and outward. - Use Azelastine 1-2 sprays each nostril twice daily as needed. Aim upward and outward. - Use Zyrtec 10 mg  daily as needed for runny nose, itchy watery eyes, sneezing.    Recurrent Infections- sinusitis, pneumonia  - Recently admitted for pneumonia.  CT chest 07/17/2022 with ground glass opacities-multifocal PNA, concerning for possible hypersensitivity pneumonitis if no infectious trigger found; however, she did have improvement with prednisone + antibiotics. Pulm is planning on repeating CT in a few months.   - Normal immunoglobulins, normal tetanus/diptheria response, low  pneumococcal response.  Rules out CVID with normal Ig.  We will boost her with Pneumovax today to see if this helps. 6 weeks from today, please get labwork done for repeat titers.  We will also check lymphocyte panel for more thorough evaluation of immune system, must be off oral prednisone for 4 weeks prior to doing that.  - She has has a CT sinus 06/2022 that was normal.  Previously saw ENT and underwent sinus surgery with them.  Repeat CT sinus 07/2022 with mild paranasal sinus mucosal thickening with air-fluid levels in bilateral maxillary and sphenoid sinuses and planning for ENT referral with visit in April 2024.       Return in about 2 months (around 10/03/2022).  Harlon Flor, MD [facility]

## 2022-08-04 NOTE — Progress Notes (Signed)
Immunotherapy   Patient Details  Name: MATTILYNN WHITCOMB MRN: [id number] Date of Birth: 1960/11/08  08/04/2022  Catalina Lunger here to receive a Pneumovax 23 for recurring infections. [place]- [id number] Lot #- [id number] Exp- 01/28/2024 Vaccine was given in left arm.  Herbie Drape 08/04/2022, 11:09 AM

## 2022-08-04 NOTE — Patient Instructions (Addendum)
Allergic Rhinitis Allergic Conjunctivitis  - Positive skin test 06/2022: mouse, mold, cockroach.   - Avoidance measures discussed. - Use nasal saline rinses before nose sprays such as with Neilmed Sinus Rinse.  Use distilled water.   - Use Flonase 2 sprays each nostril daily. Aim upward and outward. - Use Azelastine 1-2 sprays each nostril twice daily as needed. Aim upward and outward. - Use Zyrtec 10 mg daily as needed for runny nose, itchy watery eyes, sneezing.    Recurrent Infections- sinusitis, pneumonia  - Normal immunoglobulins, normal tetanus/diptheria response, low pneumococcal response.  Rules out CVID with normal Ig.  We will boost her with Pneumovax today to see if this helps. 6 weeks from today, please get labwork done for repeat titers.  We will also check lymphocyte panel, must be off oral prednisone for 4 weeks prior to doing that.   - She has has a CT sinus 06/2022 that was normal.  Previously saw ENT and underwent sinus surgery with them.  Repeat CT sinus 07/2022 with mild paranasal sinus mucosal thickening with air-fluid levels in bilateral maxillary and sphenoid sinuses and planning for ENT referral with visit in April 2024.

## 2022-08-25 ENCOUNTER — Telehealth: Payer: Self-pay | Admitting: Internal Medicine

## 2022-08-25 NOTE — Telephone Encounter (Signed)
Called and spoke with patient. Patient stated that she needed her [facility] Trey Sailors. Advised patient that she would have to call medical records to get them to send the cat scans over. Provided patient with Medical records number.   Nothing further needed.

## 2022-09-09 ENCOUNTER — Ambulatory Visit (INDEPENDENT_AMBULATORY_CARE_PROVIDER_SITE_OTHER): Payer: Medicare HMO

## 2022-09-09 ENCOUNTER — Encounter: Payer: Self-pay | Admitting: Adult Health

## 2022-09-09 ENCOUNTER — Ambulatory Visit: Payer: Medicare HMO | Admitting: Adult Health

## 2022-09-09 VITALS — BP 130/60 | HR 72 | Temp 98.1°F | Ht 64.0 in | Wt 208.2 lb

## 2022-09-09 DIAGNOSIS — J0100 Acute maxillary sinusitis, unspecified: Secondary | ICD-10-CM

## 2022-09-09 DIAGNOSIS — J329 Chronic sinusitis, unspecified: Secondary | ICD-10-CM | POA: Insufficient documentation

## 2022-09-09 DIAGNOSIS — R051 Acute cough: Secondary | ICD-10-CM

## 2022-09-09 DIAGNOSIS — J189 Pneumonia, unspecified organism: Secondary | ICD-10-CM | POA: Diagnosis not present

## 2022-09-09 DIAGNOSIS — J441 Chronic obstructive pulmonary disease with (acute) exacerbation: Secondary | ICD-10-CM

## 2022-09-09 DIAGNOSIS — J9611 Chronic respiratory failure with hypoxia: Secondary | ICD-10-CM | POA: Diagnosis not present

## 2022-09-09 LAB — POC COVID19 BINAXNOW: SARS Coronavirus 2 Ag: NEGATIVE

## 2022-09-09 MED ORDER — CEFDINIR 300 MG PO CAPS: 300.0000 mg | ORAL_CAPSULE | Freq: Two times a day (BID) | ORAL | 0 refills | Status: DC

## 2022-09-09 MED ORDER — PREDNISONE 10 MG PO TABS: ORAL_TABLET | ORAL | 0 refills | Status: DC

## 2022-09-09 NOTE — Patient Instructions (Addendum)
Omnicef 300mg  Twice daily  for 1 week , take with food  Prednisone taper over next week.  Continue on Flonase and Astelin  Continue on Zyrtec  Continue on Mucinex DM As needed   Continue with Saline rinses Twice daily   Continue on Budesonide Neb. Twice daily   Continue on Duoneb Four times a day   . CT chest as planned in 2 months  Fluids and rest  Tylenol As needed   Follow up with Dr. Chase Caller in 2 months after CT chest and As needed   Please contact office for sooner follow up if symptoms do not improve or worsen or seek emergency care

## 2022-09-09 NOTE — Assessment & Plan Note (Signed)
Acute exacerbation -chest x-ray today is improving with decreased opacity.  Suspect viral illness causing flare.  COVID-19 is negative.  Will treat with empiric antibiotics and steroids.  Plan  Patient Instructions  Omnicef 300mg  Twice daily  for 1 week , take with food  Prednisone taper over next week.  Continue on Flonase and Astelin  Continue on Zyrtec  Continue on Mucinex DM As needed   Continue with Saline rinses Twice daily   Continue on Budesonide Neb. Twice daily   Continue on Duoneb Four times a day   . CT chest as planned in 2 months  Fluids and rest  Tylenol As needed   Follow up with Dr. Chase Caller in 2 months after CT chest and As needed   Please contact office for sooner follow up if symptoms do not improve or worsen or seek emergency care

## 2022-09-09 NOTE — Assessment & Plan Note (Signed)
Recent sinusitis -CT sinus showed air-fluid levels bilaterally in the maxillary and sphenoid sinuses.  She received prolonged antibiotics with Augmentin x 21 days.  Patient has significant clinical improvement ENT referral is pending.  Patient does have acute URI symptoms currently.  Will treat with empiric antibiotics  Plan  Patient Instructions  Omnicef 300mg  Twice daily  for 1 week , take with food  Prednisone taper over next week.  Continue on Flonase and Astelin  Continue on Zyrtec  Continue on Mucinex DM As needed   Continue with Saline rinses Twice daily   Continue on Budesonide Neb. Twice daily   Continue on Duoneb Four times a day   . CT chest as planned in 2 months  Fluids and rest  Tylenol As needed   Follow up with Dr. Chase Caller in 2 months after CT chest and As needed   Please contact office for sooner follow up if symptoms do not improve or worsen or seek emergency care

## 2022-09-09 NOTE — Assessment & Plan Note (Signed)
Continue on oxygen at bedtime.  No increased oxygen demands.

## 2022-09-09 NOTE — Progress Notes (Signed)
@Patient  ID: Jenna Hancock, female    DOB: March 26, 1961, 62 y.o.   MRN: [id number]  Chief Complaint  Patient presents with   Hospitalization Follow-up    Referring provider: Pomposini, Cherly Anderson, MD  HPI: 62 year old female former smoker followed for COPD and chronic respiratory failure.  History of obstructive sleep apnea-CPAP intolerant .   TEST/EVENTS :  CT chest July 17, 2022 multifocal patchy groundglass opacities upper lobe predominant favoring a multifocal pneumonia.  CT sinus mild paranasal sinus mucosal thickening with air-fluid levels in the bilateral maxillary and sphenoid sinuses  2011 sleep study Mild OSA   Allergy notes  Normal IgE and no skin testing reactivity to Aspergillus rules out ABPA.  Positive skin test 06/2022: mouse, mold, cockroach.  - Normal immunoglobulins, normal tetanus/diptheria response, low pneumococcal response   09/09/2022 Acute OV : COPD and O2 RF  Patient presents for an acute office visit.  She complains of 3 to 4 days of increased cough, congestion, fever, sinus pressure, congestion . Yellow mucus  and body aches. Covid test in office is negative.  Admitted last month with multifocal pneumonia vs Pneumonitis  and acute sinusitis treated with IV antibiotics and discharged on a 3-week course of Augmentin.  Along with a prednisone taper.  As above CT chest showed multifocal patchy groundglass opacities, CT sinus showed air-fluid levels in the maxillary and sphenoid sinuses.  Sed rate was elevated at 120.  Patient did have clinical improvement.  Says she felt so much better, the best she has felt in long time.. Says doing great until 3-4 days ago. Was seen 1 month ago and referred to ENT and allergy.  She remains on budesonide nebulizers twice daily and DuoNeb nebulizer 4 times daily. Previously on Breztri prior to admission .  Chest x-ray today shows no consolidation.  Decreased patchy opacities  Remains on Oxygen 2l/m At bedtime  . Has history of  CPAP but unable to tolerate .  Has ENT appointment next month.   Babysits grandchild. He had URI and fever over the weekend.   Allergies  Allergen Reactions   Bactrim Rash   Sulfa Antibiotics Rash    Immunization History  Administered Date(s) Administered   Influenza Inj Mdck Quad Pf 03/14/2019   Influenza Inj Mdck Quad With Preservative 04/05/2018   Influenza,inj,Quad PF,6+ Mos 04/16/2014, 04/16/2017   Influenza,inj,quad, With Preservative 04/20/2017, 04/05/2018, 03/14/2019, 04/20/2020, 04/10/2021   Influenza-Unspecified 04/25/2015, 04/05/2022   Moderna Sars-Covid-2 Vaccination 08/15/2019, 09/15/2019, 04/16/2020   PNEUMOCOCCAL CONJUGATE-20 05/16/2021   Pneumococcal Polysaccharide-23 02/24/2019   Tdap 02/16/2012   Zoster Recombinat (Shingrix) 03/08/2018   Zoster, Unspecified 03/08/2018    Past Medical History:  Diagnosis Date   Anxiety    Arthritis    Depression    Essential hypertension, benign 12/08/2016   GERD (gastroesophageal reflux disease)    Heart murmur    AS A CHILD    High cholesterol 12/08/2016   Hyperlipidemia    Hypertension    PONV (postoperative nausea and vomiting)    Recurrent upper respiratory infection (URI)    Sleep apnea    DOES NOT USE CPAP    Stroke ([place])    HX OF MINI STROKE 2016    TIA (transient ischemic attack) 2017    Tobacco History: Social History   Tobacco Use  Smoking Status Former   Packs/day: 1.00   Years: 34.00   Additional pack years: 0.00   Total pack years: 34.00   Types: Cigarettes   Start date: 61  Quit date: 10/04/2021   Years since quitting: 0.9  Smokeless Tobacco Never  Tobacco Comments   Former smoker    Counseling given: Not Answered Tobacco comments: Former smoker    Outpatient Medications Prior to Visit  Medication Sig Dispense Refill   amLODipine (NORVASC) 5 MG tablet Take 5 mg by mouth daily.     aspirin 325 MG tablet Take 325 mg by mouth at bedtime.     atorvastatin (LIPITOR) 80 MG tablet Take  80 mg by mouth daily.     azelastine (ASTELIN) 0.1 % nasal spray Place 1 spray into both nostrils 2 (two) times daily. Use in each nostril as directed 30 mL 5   budesonide (PULMICORT) 0.5 MG/2ML nebulizer solution Take 2 mLs (0.5 mg total) by nebulization 2 (two) times daily. 170 mL 12   cetirizine (ZYRTEC ALLERGY) 10 MG tablet Take 1 tablet (10 mg total) by mouth daily as needed for allergies. 30 tablet 5   ezetimibe (ZETIA) 10 MG tablet Take 10 mg by mouth every evening.     FLUoxetine (PROZAC) 40 MG capsule Take 80 mg by mouth daily.     fluticasone (FLONASE) 50 MCG/ACT nasal spray Place 2 sprays into both nostrils daily. 16 g 5   folic acid (FOLVITE) Q000111Q MCG tablet Take 800 mcg by mouth daily.     HYDROcodone-acetaminophen (NORCO) 10-325 MG tablet Take 1 tablet by mouth in the morning and at bedtime.     ipratropium-albuterol (DUONEB) 0.5-2.5 (3) MG/3ML SOLN Take 3 mLs by nebulization 4 (four) times daily. 360 mL 2   metoprolol tartrate (LOPRESSOR) 25 MG tablet Take 25 mg by mouth at bedtime.     Multiple Vitamins-Minerals (MULTIVITAMIN WITH MINERALS) tablet Take 1 tablet by mouth daily.     OVER THE COUNTER MEDICATION Take 1 capsule by mouth daily. Fish Oil     pantoprazole (PROTONIX) 40 MG tablet Take 1 tablet (40 mg total) by mouth daily.     ALPRAZolam (XANAX) 0.5 MG tablet Take 0.5 mg by mouth 2 (two) times daily as needed for anxiety.  (Patient not taking: Reported on 09/09/2022)     ipratropium-albuterol (DUONEB) 0.5-2.5 (3) MG/3ML SOLN Take 3 mLs by nebulization every 6 (six) hours as needed. (Patient not taking: Reported on 09/09/2022) 1080 mL 11   benzonatate (TESSALON) 200 MG capsule Take 1 capsule (200 mg total) by mouth 3 (three) times daily as needed for cough. (Patient not taking: Reported on 08/04/2022) 30 capsule 1   cyclobenzaprine (FLEXERIL) 10 MG tablet Take 10 mg by mouth 2 (two) times daily as needed. (Patient not taking: Reported on 08/04/2022)     furosemide (LASIX) 20 MG  tablet Take 20 mg by mouth daily. (Patient not taking: Reported on 08/04/2022)     promethazine-dextromethorphan (PROMETHAZINE-DM) 6.25-15 MG/5ML syrup Take 5 mLs by mouth 4 (four) times daily as needed for cough. (Patient not taking: Reported on 08/04/2022) 240 mL 0   No facility-administered medications prior to visit.     Review of Systems:   Constitutional:   No  weight loss, night sweats,  Fevers, chills,  +fatigue, or  lassitude.  HEENT:   No headaches,  Difficulty swallowing,  Tooth/dental problems, or  Sore throat,                No sneezing, itching, ear ache,  +nasal congestion, post nasal drip,   CV:  No chest pain,  Orthopnea, PND, swelling in lower extremities, anasarca, dizziness, palpitations, syncope.   GI  No heartburn, indigestion, abdominal pain, nausea, vomiting, diarrhea, change in bowel habits, loss of appetite, bloody stools.   Resp: .  No chest wall deformity  Skin: no rash or lesions.  GU: no dysuria, change in color of urine, no urgency or frequency.  No flank pain, no hematuria   MS:  No joint pain or swelling.  No decreased range of motion.  No back pain.    Physical Exam  BP 130/60 (BP Location: Left Arm, Patient Position: Sitting, Cuff Size: Large)   Pulse 72   Temp 98.1 F (36.7 C) (Oral)   Ht 5\' 4"  (1.626 m)   Wt 208 lb 3.2 oz (94.4 kg)   SpO2 98%   BMI 35.74 kg/m   GEN: A/Ox3; pleasant , NAD, well nourished    HEENT:  [place]/AT,  NOSE-clear drainage , THROAT-clear, no lesions, no postnasal drip or exudate noted. Maxillary tenderness   NECK:  Supple w/ fair ROM; no JVD; normal carotid impulses w/o bruits; no thyromegaly or nodules palpated; no lymphadenopathy.    RESP  Clear  P & A; w/o, wheezes/ rales/ or rhonchi. no accessory muscle use, no dullness to percussion  CARD:  RRR, no m/r/g, no peripheral edema, pulses intact, no cyanosis or clubbing.  GI:   Soft & nt; nml bowel sounds; no organomegaly or masses detected.   Musco: Warm bil,  no deformities or joint swelling noted.   Neuro: alert, no focal deficits noted.    Skin: Warm, no lesions or rashes    Lab Results:  CBC    BNP   ProBNP No results found for: "PROBNP"  Imaging: DG Chest 2 View  Result Date: 09/09/2022 CLINICAL DATA:  Pneumonia.  Fever EXAM: CHEST - 2 VIEW COMPARISON:  Chest x-ray 07/17/2022 and older FINDINGS: No consolidation, pneumothorax or effusion. Normal cardiopericardial silhouette without edema. There are some interstitial changes noted in the lungs. The subtle patchy areas of opacity are improving IMPRESSION: Improvement in the subtle previous patchy lung hazy opacities with some underlying persistent interstitial change. No consolidation Electronically Signed   By: Jill Side M.D.   On: 09/09/2022 10:27         Latest Ref Rng & Units 03/06/2022    9:02 AM 04/22/2017   10:52 AM  PFT Results  FVC-Pre L 1.91  2.73   FVC-Predicted Pre % 59  81   FVC-Post L 2.07  2.72   FVC-Predicted Post % 64  81   Pre FEV1/FVC % % 75  78   Post FEV1/FCV % % 82  78   FEV1-Pre L 1.44  2.13   FEV1-Predicted Pre % 57  82   FEV1-Post L 1.69  2.11   DLCO uncorrected ml/min/mmHg 17.37  20.04   DLCO UNC% % 87  85   DLCO corrected ml/min/mmHg 17.37  20.04   DLCO COR %Predicted % 87  85   DLVA Predicted % 99  88   TLC L 5.84  5.16   TLC % Predicted % 117  103   RV % Predicted % 185  121     Lab Results  Component Value Date   NITRICOXIDE 12 06/25/2017        Assessment & Plan:   COPD with acute exacerbation (HCC) Acute exacerbation -chest x-ray today is improving with decreased opacity.  Suspect viral illness causing flare.  COVID-19 is negative.  Will treat with empiric antibiotics and steroids.  Plan  Patient Instructions  Omnicef 300mg  Twice daily  for  1 week , take with food  Prednisone taper over next week.  Continue on Flonase and Astelin  Continue on Zyrtec  Continue on Mucinex DM As needed   Continue with Saline rinses  Twice daily   Continue on Budesonide Neb. Twice daily   Continue on Duoneb Four times a day   . CT chest as planned in 2 months  Fluids and rest  Tylenol As needed   Follow up with Dr. Chase Caller in 2 months after CT chest and As needed   Please contact office for sooner follow up if symptoms do not improve or worsen or seek emergency care       Chronic respiratory failure ([place]) Continue on oxygen at bedtime.  No increased oxygen demands.  CAP (community acquired pneumonia) Multifocal pneumonia versus pneumonitis with clinical improvement chest x-ray today shows decreased opacities.  Has an upcoming CT chest in 2 months.  Sinusitis Recent sinusitis -CT sinus showed air-fluid levels bilaterally in the maxillary and sphenoid sinuses.  She received prolonged antibiotics with Augmentin x 21 days.  Patient has significant clinical improvement ENT referral is pending.  Patient does have acute URI symptoms currently.  Will treat with empiric antibiotics  Plan  Patient Instructions  Omnicef 300mg  Twice daily  for 1 week , take with food  Prednisone taper over next week.  Continue on Flonase and Astelin  Continue on Zyrtec  Continue on Mucinex DM As needed   Continue with Saline rinses Twice daily   Continue on Budesonide Neb. Twice daily   Continue on Duoneb Four times a day   . CT chest as planned in 2 months  Fluids and rest  Tylenol As needed   Follow up with Dr. Chase Caller in 2 months after CT chest and As needed   Please contact office for sooner follow up if symptoms do not improve or worsen or seek emergency care        I spent   42 minutes dedicated to the care of this patient on the date of this encounter to include pre-visit review of records, face-to-face time with the patient discussing conditions above, post visit ordering of testing, clinical documentation with the electronic health record, making appropriate referrals as documented, and communicating necessary findings  to members of the patients care team.   Rexene Edison, NP 09/09/2022

## 2022-09-09 NOTE — Assessment & Plan Note (Signed)
Multifocal pneumonia versus pneumonitis with clinical improvement chest x-ray today shows decreased opacities.  Has an upcoming CT chest in 2 months.

## 2022-09-12 LAB — STREP PNEUMONIAE 23 SEROTYPES IGG
Pneumo Ab Type 1*: 7.9 ug/mL (ref >1.3)
Pneumo Ab Type 12 (12F)*: 2.2 ug/mL (ref >1.3)
Pneumo Ab Type 14*: 13.9 ug/mL (ref >1.3)
Pneumo Ab Type 17 (17F)*: 24.2 ug/mL (ref >1.3)
Pneumo Ab Type 19 (19F)*: 9 ug/mL (ref >1.3)
Pneumo Ab Type 2*: 11.2 ug/mL (ref >1.3)
Pneumo Ab Type 20*: 5.7 ug/mL (ref >1.3)
Pneumo Ab Type 22 (22F)*: 3.7 ug/mL (ref >1.3)
Pneumo Ab Type 23 (23F)*: 13.3 ug/mL (ref >1.3)
Pneumo Ab Type 26 (6B)*: 1.3 ug/mL — ABNORMAL LOW (ref >1.3)
Pneumo Ab Type 3*: 1.2 ug/mL — ABNORMAL LOW (ref >1.3)
Pneumo Ab Type 34 (10A)*: 20.7 ug/mL (ref >1.3)
Pneumo Ab Type 4*: 3 ug/mL (ref >1.3)
Pneumo Ab Type 43 (11A)*: 6.4 ug/mL (ref >1.3)
Pneumo Ab Type 5*: 13.4 ug/mL (ref >1.3)
Pneumo Ab Type 51 (7F)*: 14.7 ug/mL (ref >1.3)
Pneumo Ab Type 54 (15B)*: 6.7 ug/mL (ref >1.3)
Pneumo Ab Type 56 (18C)*: 2.7 ug/mL (ref >1.3)
Pneumo Ab Type 57 (19A)*: 18.7 ug/mL (ref >1.3)
Pneumo Ab Type 68 (9V)*: 10.2 ug/mL (ref >1.3)
Pneumo Ab Type 70 (33F)*: 3.3 ug/mL (ref >1.3)
Pneumo Ab Type 8*: 11.7 ug/mL (ref >1.3)
Pneumo Ab Type 9 (9N)*: 2.9 ug/mL (ref >1.3)

## 2022-09-12 LAB — LYMPH ENUMERATION, BASIC & NK CELLS
% CD 3 Pos. Lymph.: 76.6 % (ref 57.5–86.2)
% CD 4 Pos. Lymph.: 54.5 % (ref 30.8–58.5)
% NK (CD56/16): 13.1 % (ref 1.4–19.4)
Ab NK (CD56/16): 314 /uL (ref 24–406)
Absolute CD 3: 1838 /uL (ref 622–2402)
Absolute CD 4 Helper: 1308 /uL (ref 359–1519)
Basophils Absolute: 0.1 10*3/uL (ref 0.0–0.2)
Basos: 0 %
CD19 % B Cell: 10 % (ref 3.3–25.4)
CD19 Abs: 240 /uL (ref 12–645)
CD4/CD8 Ratio: 2.44 (ref 0.92–3.72)
CD8 % Suppressor T Cell: 22.3 % (ref 12.0–35.5)
CD8 T Cell Abs: 535 /uL (ref 109–897)
EOS (ABSOLUTE): 0.3 10*3/uL (ref 0.0–0.4)
Eos: 2 %
Hematocrit: 35.2 % (ref 34.0–46.6)
Hemoglobin: 11.5 g/dL (ref 11.1–15.9)
Immature Grans (Abs): 0.1 10*3/uL (ref 0.0–0.1)
Immature Granulocytes: 1 %
Lymphocytes Absolute: 2.4 10*3/uL (ref 0.7–3.1)
Lymphs: 19 %
MCH: 28.5 pg (ref 26.6–33.0)
MCHC: 32.7 g/dL (ref 31.5–35.7)
MCV: 87 fL (ref 79–97)
Monocytes Absolute: 0.8 10*3/uL (ref 0.1–0.9)
Monocytes: 7 %
Neutrophils Absolute: 9.1 10*3/uL — ABNORMAL HIGH (ref 1.4–7.0)
Neutrophils: 71 %
Platelets: 215 10*3/uL (ref 150–450)
RBC: 4.03 x10E6/uL (ref 3.77–5.28)
RDW: 13.4 % (ref 11.7–15.4)
WBC: 12.7 10*3/uL — ABNORMAL HIGH (ref 3.4–10.8)

## 2022-09-12 LAB — B CELL SUBSET ANALYSIS
Activated CD21low CD38- %: 3.5 % of CD19 (ref 1.2–9.0)
Activated CD21low CD38-: 6 cells/uL (ref 3–26)
CD19+ B cells %: 7.8 % Lymphs (ref 6.4–22.0)
CD19+ B cells: 187 cells/uL (ref 110–450)
CD20+ %: 97.2 % of CD19 (ref 96.0–100.0)
CD20+: 182 cells/uL (ref 110–450)
Class-switched CD27+IgD-IgM- %: 9.4 % of CD19 (ref 5.1–22.0)
Class-switched CD27+IgD-IgM-: 18 cells/uL (ref 11–61)
Non switched CD27+IgD+IgM+ %: 6.6 % of CD19 (ref 2.4–15.0)
Non switched CD27+IgD+IgM+: 12 cells/uL (ref 5–46)
Plasmablasts CD38+IgM- %: 1.7 % of CD19 (ref 0.4–4.1)
Plasmablasts CD38+IgM-: 3 cells/uL (ref 1–8)
Total Memory CD27+ %: 20 % of CD19 (ref 10.0–33.0)
Total Memory CD27+: 37 cells/uL (ref 23–110)
Transitional CD38+IgM+ %: 3.7 % of CD19 (ref 0.7–5.9)
Transitional CD38+IgM+: 7 cells/uL (ref 1–17)

## 2022-10-14 ENCOUNTER — Telehealth: Payer: Self-pay | Admitting: Internal Medicine

## 2022-10-14 NOTE — Telephone Encounter (Signed)
PT calling again. States no call back. Was hoping to have something sent in today. Pls call PT @ [phone]

## 2022-10-14 NOTE — Telephone Encounter (Signed)
Coughing up dark brown like phelgym, coughing a lot and wants Dr. Marchelle Gearing to call her in a Antibiotic & Prednisone  Pharmacy: [facility] in [place]

## 2022-10-14 NOTE — Telephone Encounter (Signed)
ATC X1 unable to LVM will call back later

## 2022-10-15 ENCOUNTER — Other Ambulatory Visit: Payer: Self-pay

## 2022-10-15 MED ORDER — AZITHROMYCIN 250 MG PO TABS: ORAL_TABLET | ORAL | 0 refills | Status: DC

## 2022-10-15 MED ORDER — PREDNISONE 10 MG PO TABS: ORAL_TABLET | ORAL | 0 refills | Status: DC

## 2022-10-15 NOTE — Telephone Encounter (Signed)
Spoke with patient. She complains of productive cough with dark brown phlegm, wheezing, sob. Symptoms present since Monday.  Patient has used nebulizer and mucinex. Neither one are really helping   Pharmacy [facility] in [facility]  Patient is requesting antibiotic and prednisone  Dr. Marchelle Gearing can you please advise?

## 2022-10-15 NOTE — Telephone Encounter (Signed)
    Zpak   +  Take prednisone 40 mg daily x 2 days, then 20mg  daily x 2 days, then 10mg  daily x 2 days, then 5mg  daily x 2 days and stop   Allergies  Allergen Reactions   Bactrim Rash   Sulfa Antibiotics Rash

## 2022-10-15 NOTE — Telephone Encounter (Signed)
Zpak and prednisone have been sent to pharmacy. Patient is aware. NFN

## 2022-10-15 NOTE — Telephone Encounter (Signed)
Pt calling back returning missed call

## 2022-10-27 ENCOUNTER — Ambulatory Visit: Payer: Medicare HMO | Admitting: Internal Medicine

## 2022-10-27 VITALS — BP 114/60 | HR 62 | Temp 98.0°F | Resp 16 | Ht 62.6 in | Wt 198.6 lb

## 2022-10-27 DIAGNOSIS — B999 Unspecified infectious disease: Secondary | ICD-10-CM

## 2022-10-27 DIAGNOSIS — J328 Other chronic sinusitis: Secondary | ICD-10-CM

## 2022-10-27 DIAGNOSIS — J3089 Other allergic rhinitis: Secondary | ICD-10-CM | POA: Diagnosis not present

## 2022-10-27 MED ORDER — AZELASTINE HCL 0.1 % NA SOLN: 1.0000 | Freq: Two times a day (BID) | NASAL | 5 refills | Status: DC

## 2022-10-27 MED ORDER — FLUTICASONE PROPIONATE 50 MCG/ACT NA SUSP: 2.0000 | Freq: Every day | NASAL | 5 refills | Status: DC

## 2022-10-27 MED ORDER — CETIRIZINE HCL 10 MG PO TABS: 10.0000 mg | ORAL_TABLET | Freq: Every day | ORAL | 5 refills | Status: DC

## 2022-10-27 NOTE — Patient Instructions (Addendum)
Allergic Rhinitis - Positive skin test 06/2022: mouse, mold, cockroach.   - Avoidance measures discussed. - Use nasal saline rinses before nose sprays such as with Neilmed Sinus Rinse.  Use distilled water.   - Use Flonase 2 sprays each nostril daily. Aim upward and outward. - Use Azelastine 1-2 sprays each nostril twice daily as needed. Aim upward and outward. - Use Zyrtec 10 mg daily.    Recurrent Infections- sinusitis, pneumonia  - Normal immunoglobulins, normal tetanus/diptheria response, initially with low pneumococcal titers but with robust pneumococcal response after Pneumovax.  Lymphocyte subsets were also normal.  - - ENT with mild thickening of sinus but normal scope.

## 2022-10-27 NOTE — Progress Notes (Signed)
FOLLOW UP Date of Service/Encounter:  10/27/22   Subjective:  Jenna Hancock (DOB: 08-Aug-1960) is a 62 y.o. female who [facility] for follow up for recurrent infections and allergic rhinitis.   History obtained from: chart review and patient. Last visit was with me on 08/04/2022 and at the time, we discussed obtaining repeat titers after pneumovax booster and lymphocyte panel.  Also was due to see ENT. Also on Flonase, Azelastine, Zyrtec.   Since last visit, she reports doing a lot better. She has only been sick x1 where she had green mucoid cough and sore throat requiring antibiotics when she saw Pulm at the end of May.  Also saw ENT 09/25/2022 Dr. Marene Lenz and at the time, the endoscopy was unremarkable so no further need for any interventions.  She is using Flonase, Azelastine and Zyrtec daily which are helping.   Of note, lymphocyte subset and repeat pneumonia titers were normal.    Past Medical History: Past Medical History:  Diagnosis Date   Anxiety    Arthritis    Depression    Essential hypertension, benign 12/08/2016   GERD (gastroesophageal reflux disease)    Heart murmur    AS A CHILD    High cholesterol 12/08/2016   Hyperlipidemia    Hypertension    PONV (postoperative nausea and vomiting)    Recurrent upper respiratory infection (URI)    Sleep apnea    DOES NOT USE CPAP    Stroke (HCC)    HX OF MINI STROKE 2016    TIA (transient ischemic attack) 2017    Objective:  BP 114/60   Pulse 62   Temp 98 F (36.7 C) (Temporal)   Resp 16   Ht 5' 2.6" (1.59 m)   Wt 198 lb 9.6 oz (90.1 kg)   SpO2 96%   BMI 35.63 kg/m  Body mass index is 35.63 kg/m. Physical Exam: GEN: alert, well developed HEENT: clear conjunctiva, TM grey and translucent, nose with mild inferior turbinate hypertrophy, pink nasal mucosa, no rhinorrhea, no cobblestoning HEART: regular rate and rhythm, no murmur LUNGS: clear to auscultation  bilaterally, no coughing, unlabored respiration SKIN: no rashes or lesions  Assessment:   1. Allergic rhinitis caused by mold   2. Allergic rhinitis due to insect   3. Recurrent infections   4. Other chronic sinusitis     Plan/Recommendations:  Allergic Rhinitis - Positive skin test 06/2022: mouse, mold, cockroach.   - Avoidance measures discussed. - Use nasal saline rinses before nose sprays such as with Neilmed Sinus Rinse.  Use distilled water.   - Use Flonase 2 sprays each nostril daily. Aim upward and outward. - Use Azelastine 1-2 sprays each nostril twice daily as needed. Aim upward and outward. - Use Zyrtec 10 mg daily.   Recurrent Infections- sinusitis, pneumonia  - Normal immunoglobulins, normal tetanus/diptheria response, initially with low pneumococcal titers but with robust pneumococcal response after Pneumovax.  Lymphocyte subsets were also normal. Did discuss that I hope boosting her pneumonia titers helps with infection prevention. No signs of immunodeficiency.   - She has has a CT sinus 06/2022 that was normal.  Previously saw ENT and underwent sinus surgery with them.  Repeat CT sinus 07/2022 with mild paranasal sinus mucosal thickening with air-fluid levels in bilateral maxillary and sphenoid sinuses; normal scope recently with ENT so no changes.     Return in about 6 months (around 04/29/2023).  Alesia Morin, MD [facility]

## 2022-10-29 ENCOUNTER — Ambulatory Visit (HOSPITAL_COMMUNITY): Payer: Medicare HMO

## 2022-11-25 ENCOUNTER — Ambulatory Visit (HOSPITAL_COMMUNITY)
Admission: RE | Admit: 2022-11-25 | Discharge: 2022-11-25 | Disposition: A | Discharge status: Home / Self Care | Payer: Medicare HMO | Source: Ambulatory Visit | Attending: Internal Medicine | Admitting: Internal Medicine

## 2022-11-25 DIAGNOSIS — Z87891 Personal history of nicotine dependence: Secondary | ICD-10-CM | POA: Diagnosis present

## 2022-11-25 DIAGNOSIS — I700 Atherosclerosis of aorta: Secondary | ICD-10-CM | POA: Diagnosis not present

## 2022-11-25 DIAGNOSIS — Z8701 Personal history of pneumonia (recurrent): Secondary | ICD-10-CM | POA: Insufficient documentation

## 2022-11-25 DIAGNOSIS — I2510 Atherosclerotic heart disease of native coronary artery without angina pectoris: Secondary | ICD-10-CM | POA: Diagnosis not present

## 2022-11-25 DIAGNOSIS — R918 Other nonspecific abnormal finding of lung field: Secondary | ICD-10-CM | POA: Insufficient documentation

## 2023-01-16 ENCOUNTER — Other Ambulatory Visit: Payer: Self-pay | Admitting: Internal Medicine

## 2023-04-10 ENCOUNTER — Encounter: Payer: Self-pay | Admitting: Adult Health

## 2023-04-10 ENCOUNTER — Ambulatory Visit: Payer: Medicare HMO | Admitting: Adult Health

## 2023-04-10 VITALS — HR 72

## 2023-04-10 DIAGNOSIS — R911 Solitary pulmonary nodule: Secondary | ICD-10-CM

## 2023-04-10 DIAGNOSIS — J849 Interstitial pulmonary disease, unspecified: Secondary | ICD-10-CM | POA: Diagnosis not present

## 2023-04-10 DIAGNOSIS — J9611 Chronic respiratory failure with hypoxia: Secondary | ICD-10-CM

## 2023-04-10 DIAGNOSIS — J449 Chronic obstructive pulmonary disease, unspecified: Secondary | ICD-10-CM | POA: Diagnosis not present

## 2023-04-10 MED ORDER — BREZTRI AEROSPHERE 160-9-4.8 MCG/ACT IN AERO: 2.0000 | INHALATION_SPRAY | Freq: Two times a day (BID) | RESPIRATORY_TRACT | 5 refills | Status: AC

## 2023-04-10 NOTE — Progress Notes (Signed)
@Patient  ID: Jenna Hancock, female    DOB: [birth year], 62 y.o.   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   TEST/EVENTS :  Exhaled nitric oxide in the office 03/19/2017: 7 ppb   Spirometry 03/19/2017 -> fev1 1/9L/70%, Ratio 76 -  Walking desaturation test 185 feet 3 laps on room air with a full head probe: Resting heart rate 57/m. Peak heart rate 85/m. Resting pulse ox 100%. Final pulse ox 99% and she became short of breath. She did not desaturate.   CT chest July 17, 2022 multifocal patchy groundglass opacities upper lobe predominant favoring a multifocal pneumonia.   CT sinus mild paranasal sinus mucosal thickening with air-fluid levels in the bilateral maxillary and sphenoid sinuses   2011 sleep study Mild OSA    Allergy notes  Normal IgE and no skin testing reactivity to Aspergillus rules out ABPA.  Positive skin test 06/2022: mouse, mold, cockroach.  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 Chickens -normal HP Panel 2019  PFT March 06, 2022 showed moderate restriction with airway reversibility.  FEV1 at 57%, ratio 75, FVC 59%, positive bronchodilator response, postbronchodilator FEV1 68%, ratio 82, FVC 64%, DLCO 87%.    04/10/2023 Follow up: COPD, O2 RF   Allergy doctor   Doing great no issues  Taking allegery meds   No cough  Chickens   Stopped breztri     Allergies  Allergen Reactions   Ace Inhibitors Nausea Only   Bactrim Rash   Sulfa Antibiotics Rash    Immunization History  Administered Date(s)  Administered   Influenza Inj Mdck Quad Pf 03/14/2019   Influenza Inj Mdck Quad With Preservative 04/05/2018   Influenza,inj,Quad PF,6+ Mos 04/16/2014, 04/16/2017   Influenza,inj,quad, With Preservative 04/20/2017, 04/05/2018, 03/14/2019, 04/20/2020, 04/10/2021   Influenza-Unspecified 04/25/2015, 04/05/2022   Moderna Sars-Covid-2 Vaccination 08/15/2019, 09/15/2019, 04/16/2020   PNEUMOCOCCAL CONJUGATE-20 05/16/2021   Pneumococcal Polysaccharide-23 02/24/2019   Tdap 02/16/2012   Zoster Recombinant(Shingrix) 03/08/2018   Zoster, Unspecified 03/08/2018    Past Medical History:  Diagnosis Date   Anxiety    Arthritis    Depression    Essential hypertension, benign 12/08/2016   GERD (gastroesophageal reflux disease)    Heart murmur    AS A CHILD    High cholesterol 12/08/2016   Hyperlipidemia    Hypertension    PONV (postoperative nausea and vomiting)    Recurrent upper respiratory infection (URI)    Sleep apnea    DOES NOT USE CPAP    Stroke (HCC)    HX OF MINI STROKE 2016    TIA (transient ischemic attack) 2017    Tobacco History: Social History   Tobacco Use  Smoking Status Former   Current packs/day: 0.00   Average packs/day: 1 pack/day for 40.3 years (40.3 ttl pk-yrs)   Types: Cigarettes   Start date: 51   Quit date: 10/04/2021   Years since quitting: 1.5  Smokeless Tobacco Never  Tobacco Comments   Former smoker    Counseling given: Not Answered Tobacco comments: Former smoker    Outpatient Medications Prior to Visit  Medication Sig Dispense Refill  ALPRAZolam (XANAX) 0.5 MG tablet Take 0.5 mg by mouth 2 (two) times daily as needed for anxiety.     amLODipine (NORVASC) 5 MG tablet Take 5 mg by mouth daily.     aspirin 325 MG tablet Take 325 mg by mouth at bedtime.     atorvastatin (LIPITOR) 80 MG tablet Take 80 mg by mouth daily.     azelastine (ASTELIN) 0.1 % nasal spray Place 1 spray into both nostrils 2 (two) times daily. Use in each nostril as directed  30 mL 5   budesonide (PULMICORT) 0.5 MG/2ML nebulizer solution Take 2 mLs (0.5 mg total) by nebulization 2 (two) times daily. 170 mL 12   cetirizine (ZYRTEC ALLERGY) 10 MG tablet Take 1 tablet (10 mg total) by mouth daily. 30 tablet 5   ezetimibe (ZETIA) 10 MG tablet Take 10 mg by mouth every evening.     FLUoxetine (PROZAC) 40 MG capsule Take 80 mg by mouth daily.     fluticasone (FLONASE) 50 MCG/ACT nasal spray Place 2 sprays into both nostrils daily. 16 g 5   folic acid (FOLVITE) 800 MCG tablet Take 800 mcg by mouth daily.     furosemide (LASIX) 20 MG tablet Take 20 mg by mouth daily.     HYDROcodone-acetaminophen (NORCO) 10-325 MG tablet Take 1 tablet by mouth in the morning and at bedtime.     ipratropium-albuterol (DUONEB) 0.5-2.5 (3) MG/3ML SOLN Take 3 mLs by nebulization 4 (four) times daily. 360 mL 2   irbesartan-hydrochlorothiazide (AVALIDE) 300-12.5 MG tablet Take 1 tablet by mouth daily.     metoprolol tartrate (LOPRESSOR) 25 MG tablet Take 25 mg by mouth at bedtime.     Multiple Vitamins-Minerals (MULTIVITAMIN WITH MINERALS) tablet Take 1 tablet by mouth daily.     OVER THE COUNTER MEDICATION Take 1 capsule by mouth daily. Fish Oil     pantoprazole (PROTONIX) 40 MG tablet Take 1 tablet (40 mg total) by mouth daily.     roflumilast (DALIRESP) 500 MCG TABS tablet TAKE 1 TABLET EVERY DAY 90 tablet 3   azithromycin (ZITHROMAX) 250 MG tablet Take as directed (Patient not taking: Reported on 04/10/2023) 6 tablet 0   cefdinir (OMNICEF) 300 MG capsule Take 1 capsule (300 mg total) by mouth 2 (two) times daily. (Patient not taking: Reported on 04/10/2023) 14 capsule 0   No facility-administered medications prior to visit.     Review of Systems:   Constitutional:   No  weight loss, night sweats,  Fevers, chills, fatigue, or  lassitude.  HEENT:   No headaches,  Difficulty swallowing,  Tooth/dental problems, or  Sore throat,                No sneezing, itching, ear ache, nasal  congestion, post nasal drip,   CV:  No chest pain,  Orthopnea, PND, swelling in lower extremities, anasarca, dizziness, palpitations, syncope.   GI  No heartburn, indigestion, abdominal pain, nausea, vomiting, diarrhea, change in bowel habits, loss of appetite, bloody stools.   Resp: No shortness of breath with exertion or at rest.  No excess mucus, no productive cough,  No non-productive cough,  No coughing up of blood.  No change in color of mucus.  No wheezing.  No chest wall deformity  Skin: no rash or lesions.  GU: no dysuria, change in color of urine, no urgency or frequency.  No flank pain, no hematuria   MS:  No joint pain or swelling.  No decreased range of motion.  No back pain.    Physical Exam  Pulse 72   SpO2 98%   GEN: A/Ox3; pleasant , NAD, well nourished    HEENT:  [place]/AT,  EACs-clear, TMs-wnl, NOSE-clear, THROAT-clear, no lesions, no postnasal drip or exudate noted.   NECK:  Supple w/ fair ROM; no JVD; normal carotid impulses w/o bruits; no thyromegaly or nodules palpated; no lymphadenopathy.    RESP  Clear  P & A; w/o, wheezes/ rales/ or rhonchi. no accessory muscle use, no dullness to percussion  CARD:  RRR, no m/r/g, no peripheral edema, pulses intact, no cyanosis or clubbing.  GI:   Soft & nt; nml bowel sounds; no organomegaly or masses detected.   Musco: Warm bil, no deformities or joint swelling noted.   Neuro: alert, no focal deficits noted.    Skin: Warm, no lesions or rashes    Lab Results:  CBC    Component Value Date/Time   WBC 12.7 (H) 09/08/2022 1240   WBC 12.7 (H) 07/19/2022 0601   RBC 4.03 09/08/2022 1240   RBC 3.80 (L) 07/19/2022 0601   HGB 11.5 09/08/2022 1240   HCT 35.2 09/08/2022 1240   PLT 215 09/08/2022 1240   MCV 87 09/08/2022 1240   MCH 28.5 09/08/2022 1240   MCH 29.2 07/19/2022 0601   MCHC 32.7 09/08/2022 1240   MCHC 32.6 07/19/2022 0601   RDW 13.4 09/08/2022 1240   LYMPHSABS 2.4 09/08/2022 1240   MONOABS 0.2  07/19/2022 0601   EOSABS 0.3 09/08/2022 1240   BASOSABS 0.1 09/08/2022 1240    BMET    Component Value Date/Time   NA 135 07/18/2022 0455   K 3.6 07/18/2022 0455   CL 106 07/18/2022 0455   CO2 23 07/18/2022 0455   GLUCOSE 89 07/18/2022 0455   BUN 14 07/18/2022 0455   CREATININE 0.48 07/18/2022 0455   CREATININE 0.90 12/08/2016 1143   CALCIUM 8.9 07/18/2022 0455   GFRNONAA >60 07/18/2022 0455   GFRAA >60 02/02/2020 0300    BNP    Component Value Date/Time   BNP 27.0 01/22/2017 1249    ProBNP No results found for: "PROBNP"  Imaging: No results found.  Administration History     None          Latest Ref Rng & Units 03/06/2022    9:02 AM 04/22/2017   10:52 AM  PFT Results  FVC-Pre L 1.91  2.73   FVC-Predicted Pre % 59  81   FVC-Post L 2.07  2.72   FVC-Predicted Post % 64  81   Pre FEV1/FVC % % 75  78   Post FEV1/FCV % % 82  78   FEV1-Pre L 1.44  2.13   FEV1-Predicted Pre % 57  82   FEV1-Post L 1.69  2.11   DLCO uncorrected ml/min/mmHg 17.37  20.04   DLCO UNC% % 87  85   DLCO corrected ml/min/mmHg 17.37  20.04   DLCO COR %Predicted % 87  85   DLVA Predicted % 99  88   TLC L 5.84  5.16   TLC % Predicted % 117  103   RV % Predicted % 185  121     Lab Results  Component Value Date   NITRICOXIDE 12 06/25/2017        Assessment & Plan:   No problem-specific Assessment & Plan notes found for this encounter.     Rubye Oaks, NP 04/10/2023

## 2023-04-10 NOTE — Patient Instructions (Addendum)
Continue on Flonase and Astelin  Continue on Zyrtec daily  Restart Breztri 1 puff Twice daily  Albuterol inhaler or Duoneb As needed   Continue on Oxygen 2l/m At bedtime   Work on stopping smoking  CT chest in 3 months  Follow up with Dr. Marchelle Gearing in 3 months with PFT and As needed

## 2023-04-13 DIAGNOSIS — J449 Chronic obstructive pulmonary disease, unspecified: Secondary | ICD-10-CM | POA: Insufficient documentation

## 2023-04-13 NOTE — Assessment & Plan Note (Signed)
Continue on oxygen at bedtime 

## 2023-04-13 NOTE — Assessment & Plan Note (Signed)
COPD.  Patient is encouraged on smoking cessation.  Encouraged on using Breztri on a consistent basis.  Plan  Patient Instructions  Continue on Flonase and Astelin  Continue on Zyrtec daily  Restart Breztri 1 puff Twice daily  Albuterol inhaler or Duoneb As needed   Continue on Oxygen 2l/m At bedtime   Work on stopping smoking  CT chest in 3 months  Follow up with Dr. Marchelle Gearing in 3 months with PFT and As needed

## 2023-04-13 NOTE — Assessment & Plan Note (Signed)
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  With groundglass and nodular opacities.  Encouraged on smoking cessation.  Will follow-up CT chest in 3 months. Check PFTs on return Plan  . Patient Instructions  Continue on Flonase and Astelin  Continue on Zyrtec daily  Restart Breztri 1 puff Twice daily  Albuterol inhaler or Duoneb As needed   Continue on Oxygen 2l/m At bedtime   Work on stopping smoking  CT chest in 3 months  Follow up with Dr. Marchelle Gearing in 3 months with PFT and As needed

## 2023-04-23 ENCOUNTER — Inpatient Hospital Stay: Admission: RE | Admit: 2023-04-23 | Payer: Medicare HMO | Source: Ambulatory Visit

## 2023-04-27 ENCOUNTER — Ambulatory Visit: Payer: Medicare HMO | Admitting: Internal Medicine

## 2023-04-27 ENCOUNTER — Encounter: Payer: Self-pay | Admitting: Internal Medicine

## 2023-04-27 VITALS — BP 104/40 | HR 81 | Temp 98.2°F | Resp 20 | Ht 62.4 in | Wt 199.2 lb

## 2023-04-27 DIAGNOSIS — F17218 Nicotine dependence, cigarettes, with other nicotine-induced disorders: Secondary | ICD-10-CM | POA: Diagnosis not present

## 2023-04-27 DIAGNOSIS — I952 Hypotension due to drugs: Secondary | ICD-10-CM

## 2023-04-27 DIAGNOSIS — J069 Acute upper respiratory infection, unspecified: Secondary | ICD-10-CM

## 2023-04-27 DIAGNOSIS — J3089 Other allergic rhinitis: Secondary | ICD-10-CM

## 2023-04-27 DIAGNOSIS — B999 Unspecified infectious disease: Secondary | ICD-10-CM

## 2023-04-27 DIAGNOSIS — R42 Dizziness and giddiness: Secondary | ICD-10-CM | POA: Diagnosis not present

## 2023-04-27 MED ORDER — AZELASTINE HCL 0.1 % NA SOLN: 1.0000 | Freq: Two times a day (BID) | NASAL | 5 refills | Status: DC | PRN

## 2023-04-27 MED ORDER — FLUTICASONE PROPIONATE 50 MCG/ACT NA SUSP: 2.0000 | Freq: Every day | NASAL | 5 refills | Status: DC

## 2023-04-27 MED ORDER — CETIRIZINE HCL 10 MG PO TABS: 10.0000 mg | ORAL_TABLET | Freq: Every day | ORAL | 5 refills | Status: DC

## 2023-04-27 NOTE — Progress Notes (Signed)
FOLLOW UP Date of Service/Encounter:  04/27/23   Subjective:  Jenna Hancock (DOB: August 03, 1960) is a 62 y.o. female who [facility] for follow up for allergic rhinitis, recurrent infections.   History obtained from: chart review and patient. Last visit was with me on 10/27/2022 and at the time was doing well.  On Flonase, azelastine, Zyrtec.  Prior immune workup has been unremarkable.    Since last visit, she saw Pulm 04/10/2023 and at the time, discussed using her Breztri daily, repeat CT chest showed findings concerning for respiratory bronchiolitis ILD, currently smoking.  Sometimes wearing O2 at night.  Discussed complete smoking cessation.  She still smokes here and there; her daughter has breast cancer and it is her way of dealing with stress/anxiety.   Also around Saturday, 2-3 days ago, she started noticing feeling fatigue, sinus pressure, some nasal dryness and SOB.  Reports painting a room when all of this started.  She has stopped painting the bedroom since then.  But still feels just bad and fatigued.  Denies any high fevers. Using Flonase/Azelastine/Zyrtec daily.   Of note, she has also felt lightheaded and dizzy the past several months.  She is taking amlodipine, irbesartan-hydrochlorothiazide and was previously on Metoprolol but self discontinued it.  Her PCP is retiring and she is in between finding someone now.  Past Medical History: Past Medical History:  Diagnosis Date   Anxiety    Arthritis    Depression    Essential hypertension, benign 12/08/2016   GERD (gastroesophageal reflux disease)    Heart murmur    AS A CHILD    High cholesterol 12/08/2016   Hyperlipidemia    Hypertension    PONV (postoperative nausea and vomiting)    Recurrent upper respiratory infection (URI)    Sleep apnea    DOES NOT USE CPAP    Stroke (HCC)    HX OF MINI STROKE 2016    TIA (transient ischemic attack) 2017    Objective:  BP (!) 104/40    Pulse 81   Temp 98.2 F (36.8 C)   Resp 20   Ht 5' 2.4" (1.585 m)   Wt 199 lb 4 oz (90.4 kg)   SpO2 96%   BMI 35.98 kg/m  Body mass index is 35.98 kg/m. Physical Exam: GEN: alert, well developed HEENT: clear conjunctiva, nose with mild inferior turbinate hypertrophy, pink nasal mucosa, no rhinorrhea, no cobblestoning HEART: regular rate and rhythm, no murmur LUNGS: clear to auscultation bilaterally, no coughing, unlabored respiration SKIN: no rashes or lesions  Assessment:   1. Perennial allergic rhinitis   2. Hypotension due to drugs   3. Dizziness   4. Recurrent infections   5. Viral URI   6. Cigarette nicotine dependence with other nicotine-induced disorder     Plan/Recommendations:  Allergic Rhinitis - Avoid exposure to the paint fumes. Can cause irritant rhinitis, it is also possible she has a viral URI.  No needs for antibiotics for a bacterial sinus infection at this time as she has no high fevers, facial pain, mucoid drainage, double worsening.  - Positive skin test 06/2022: mouse, mold, cockroach.   - Avoidance measures discussed. - Use nasal saline rinses before nose sprays such as with Neilmed Sinus Rinse.  Use distilled water.   - Use Flonase 2 sprays each nostril daily. Aim upward and outward. Hold 2-3 days if you see nosebleeds.   - Use Azelastine 1-2 sprays each nostril twice daily as  needed congestion, drainage, sneezing.. Aim upward and outward. - Use Zyrtec 10 mg daily.   Recurrent Infections- sinusitis, pneumonia  - Normal immunoglobulins, normal tetanus/diptheria response, initially with low pneumococcal titers but with robust pneumococcal response after Pneumovax.  Lymphocyte subsets were also normal.  - ENT with mild thickening of sinus but normal scope.    Possible ILD/COPD - Followed by Pulm.  Also on Breztri and PRN Albuterol/Duonebs.  Also on nighttime home O2 PRN.  - Work on tobacco cessation. Patient provided counseling on tobacco cessation  today. Discussed the various impacts smoking has on health such as increased infections, lung disease, malignancy, heart disease, stroke etc. Patient is alert and competent.   Willingness to quit: thinking about it, has a lot of stressors in life  Methods/skills for cessation: wants to work on this slowly Medication management for smoking: none Resources provided include discussion of chronic effects  Quit date:n/a  Will discuss further at next follow up visit. Spent over 3 minutes for tobacco cessation counseling.        Viral URI: Viral Upper Respiratory Infections Antibiotics only work on bacterial infections. Because URIs usually are caused by viruses, antibiotics are not an effective treatment.   Nasal congestion, post nasal dripping, or sinus pressure:   Nasal rinsing with saline nasal spray or salt water (e.g., Neilmed Sinus Rinse bottle) can help relieve nasal dryness.   Use a warm mist humidifier or take a steamy shower to increase moisture in the air and soothe oral and nasal tissues that become irritated with dry air.   Sore throat:  Use a pain reliever, such as acetaminophen (Tylenol) or ibuprofen (Advil).   Gargle with salt water (1/4 tsp of salt dissolved in 8 oz of warm water). Salt water can be used as often as you like.   Throat lozenges or throat sprays (Cepacol lozenges or Chloroseptic spray) may bring some relief by increasing saliva production and lubricating your throat.   Drink plenty of fluids (e.g., water, diluted juice, tea) to soothe your throat and to stay well hydrated.   Coughing:   Medications that contain dextromethorphan (e.g., Robitussin DM, Mucinex DM, Delsym) may help to suppress a cough.   Tea with honey, when taken regularly, can soothe a sore throat and help suppress a cough. Take care with medications   Strengthen your immune system:   Make sure you eat well (e.g., a healthy, balanced variety of foods).   Avoid alcohol as it can directly suppress  various immune responses.   Stay away from cigarettes, and second hand smoke.   Rest as much as possible and get plenty of sleep (at least 8 hours).   Hypotension/Dizziness: - Follow up with PCP about blood pressure control.  Check you BP at home in the morning and keep a log.   - Stop the amlodipine for now due to hypotension with dizziness. Continue the irbesartan 300mg  daily-hydrochlorothiazide 12.5mg  daily.  Discussed that depending on what her BP does, she may need to restart amlodipine at smaller dose in the future.  - She has also self discontinued the metoprolol and has not taken it in past several months.     Primary care: [facility] [address] [place],  [place]  [address] [phone]  [facility] [address] [place], [place] [address] [phone]  Return in about 6 months (around 10/25/2023).  Alesia Morin, MD [facility]

## 2023-04-27 NOTE — Patient Instructions (Addendum)
Allergic Rhinitis - Avoid exposure to the paint fumes.  - Positive skin test 06/2022: mouse, mold, cockroach.   - Avoidance measures discussed. - Use nasal saline rinses before nose sprays such as with Neilmed Sinus Rinse.  Use distilled water.   - Use Flonase 2 sprays each nostril daily. Aim upward and outward. Hold 2-3 days if you see nosebleeds.   - Use Azelastine 1-2 sprays each nostril twice daily as needed congestion, drainage, sneezing.. Aim upward and outward. - Use Zyrtec 10 mg daily.   Hypotension/Dizziness: - Follow up with PCP about blood pressure control.  Check you BP at home in the morning.   - Stop the amlodipine for now. Continue the irbesartan-hydrochlorothiazide.  - She has also self discontinued the metoprolol.   Recurrent Infections- sinusitis, pneumonia  - Normal immunoglobulins, normal tetanus/diptheria response, initially with low pneumococcal titers but with robust pneumococcal response after Pneumovax.  Lymphocyte subsets were also normal.  - ENT with mild thickening of sinus but normal scope.    Possible ILD/COPD - Followed by Pulm.  Also on Breztri and PRN Albuterol/Duonebs.  - Work on tobacco cessation    Viral URI: Viral Upper Respiratory Infections Antibiotics only work on bacterial infections. Because URIs usually are caused by viruses, antibiotics are not an effective treatment.   Nasal congestion, post nasal dripping, or sinus pressure:   Nasal rinsing with saline nasal spray or salt water (e.g., Neilmed Sinus Rinse bottle) can help relieve nasal dryness.   Use a warm mist humidifier or take a steamy shower to increase moisture in the air and soothe oral and nasal tissues that become irritated with dry air.   Sore throat:  Use a pain reliever, such as acetaminophen (Tylenol) or ibuprofen (Advil).   Gargle with salt water (1/4 tsp of salt dissolved in 8 oz of warm water). Salt water can be used as often as you like.   Throat lozenges or throat sprays  (Cepacol lozenges or Chloroseptic spray) may bring some relief by increasing saliva production and lubricating your throat.   Drink plenty of fluids (e.g., water, diluted juice, tea) to soothe your throat and to stay well hydrated.   Coughing:   Medications that contain dextromethorphan (e.g., Robitussin DM, Mucinex DM, Delsym) may help to suppress a cough.   Tea with honey, when taken regularly, can soothe a sore throat and help suppress a cough. Take care with medications   Strengthen your immune system:   Make sure you eat well (e.g., a healthy, balanced variety of foods).   Avoid alcohol as it can directly suppress various immune responses.   Stay away from cigarettes, and second hand smoke.   Rest as much as possible and get plenty of sleep (at least 8 hours).    Primary care: [facility] [address] [place],  [place]  [address] [phone]  [facility] [address], [place], [place] [address] [phone]

## 2023-07-17 ENCOUNTER — Ambulatory Visit (INDEPENDENT_AMBULATORY_CARE_PROVIDER_SITE_OTHER): Payer: Medicare HMO | Admitting: Internal Medicine

## 2023-07-17 ENCOUNTER — Encounter: Payer: Self-pay | Admitting: Internal Medicine

## 2023-07-17 VITALS — BP 126/64 | HR 74 | Ht 64.0 in | Wt 199.6 lb

## 2023-07-17 DIAGNOSIS — Z79899 Other long term (current) drug therapy: Secondary | ICD-10-CM | POA: Diagnosis not present

## 2023-07-17 DIAGNOSIS — J310 Chronic rhinitis: Secondary | ICD-10-CM

## 2023-07-17 DIAGNOSIS — E782 Mixed hyperlipidemia: Secondary | ICD-10-CM

## 2023-07-17 DIAGNOSIS — J9611 Chronic respiratory failure with hypoxia: Secondary | ICD-10-CM

## 2023-07-17 DIAGNOSIS — F32A Depression, unspecified: Secondary | ICD-10-CM | POA: Insufficient documentation

## 2023-07-17 DIAGNOSIS — J849 Interstitial pulmonary disease, unspecified: Secondary | ICD-10-CM | POA: Diagnosis not present

## 2023-07-17 DIAGNOSIS — F419 Anxiety disorder, unspecified: Secondary | ICD-10-CM

## 2023-07-17 DIAGNOSIS — J449 Chronic obstructive pulmonary disease, unspecified: Secondary | ICD-10-CM | POA: Diagnosis not present

## 2023-07-17 DIAGNOSIS — G894 Chronic pain syndrome: Secondary | ICD-10-CM

## 2023-07-17 DIAGNOSIS — I10 Essential (primary) hypertension: Secondary | ICD-10-CM

## 2023-07-17 DIAGNOSIS — K219 Gastro-esophageal reflux disease without esophagitis: Secondary | ICD-10-CM

## 2023-07-17 NOTE — Assessment & Plan Note (Signed)
Mood stable currently.  Anxiety adequately controlled.  She is prescribed fluoxetine 80 mg daily and alprazolam 0.25 mg daily as needed for anxiety relief.  This was managed by her PCP previously.  PDMP reviewed.  Prescription last filled in June 2024.  She reports taking alprazolam once per month. -UDS pending.  Controlled substance agreement signed today.

## 2023-07-17 NOTE — Assessment & Plan Note (Signed)
Nocturnal hypoxia.  She uses supplemental oxygen at bedtime.

## 2023-07-17 NOTE — Progress Notes (Signed)
New Patient Office Visit  Subjective    Patient ID: Jenna Hancock, female    DOB: 18-Sep-1960  Age: 63 y.o. MRN: [id number]  CC:  Chief Complaint  Patient presents with   Establish Care    HPI Jenna Hancock presents to establish care.  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  Previously followed by Dr. Jonelle Sports in [place], [place].  Ms. Comes reports feeling well today.  She is asymptomatic and has no acute concerns to discuss aside from desiring to establish care.  She is retired.  She endorses current tobacco use, smoking less than 1 pack of cigarettes per week.  She has been smoking cigarettes since age 33.  Denies alcohol and illicit drug use.  Family medical history significant for non-small cell lung cancer.  Chronic medical conditions and outstanding preventative care items discussed today are individually addressed in A/P below.   Outpatient Encounter Medications as of 07/17/2023  Medication Sig   ALPRAZolam (XANAX) 0.5 MG tablet Take 0.5 mg by mouth 2 (two) times daily as needed for anxiety.   amLODipine (NORVASC) 5 MG tablet Take 5 mg by mouth daily.   aspirin 325 MG tablet Take 325 mg by mouth at bedtime.   atorvastatin (LIPITOR) 80 MG tablet Take 80 mg by mouth daily.   azelastine (ASTELIN) 0.1 % nasal spray Place 1 spray into both nostrils 2 (two) times daily as needed for rhinitis. Use in each nostril as directed   Budeson-Glycopyrrol-Formoterol (BREZTRI AEROSPHERE) 160-9-4.8 MCG/ACT AERO Inhale 2 puffs into the lungs in the morning and at bedtime.   cetirizine (ZYRTEC ALLERGY) 10 MG tablet Take 1 tablet (10 mg total) by mouth daily.   ezetimibe (ZETIA) 10 MG tablet Take 10 mg by mouth every evening.   FLUoxetine (PROZAC) 40 MG capsule Take 80 mg by mouth daily.   fluticasone (FLONASE) 50  MCG/ACT nasal spray Place 2 sprays into both nostrils daily.   folic acid (FOLVITE) 800 MCG tablet Take 800 mcg by mouth daily.   HYDROcodone-acetaminophen (NORCO) 10-325 MG tablet Take 1 tablet by mouth in the morning and at bedtime.   ipratropium-albuterol (DUONEB) 0.5-2.5 (3) MG/3ML SOLN Take 3 mLs by nebulization 4 (four) times daily.   irbesartan-hydrochlorothiazide (AVALIDE) 300-12.5 MG tablet Take 1 tablet by mouth daily.   Multiple Vitamins-Minerals (MULTIVITAMIN WITH MINERALS) tablet Take 1 tablet by mouth daily.   OVER THE COUNTER MEDICATION Take 1 capsule by mouth daily. Fish Oil   pantoprazole (PROTONIX) 40 MG tablet Take 1 tablet (40 mg total) by mouth daily.   roflumilast (DALIRESP) 500 MCG TABS tablet TAKE 1 TABLET EVERY DAY   cyclobenzaprine (FLEXERIL) 10 MG tablet Take 10 mg by mouth 3 (three) times daily as needed. (Patient not taking: Reported on 07/17/2023)   furosemide (LASIX) 20 MG tablet Take 20 mg by mouth daily. (Patient not taking: Reported on 07/17/2023)   IBU 800 MG tablet TAKE 1 TABLET EVERY DAY AS NEEDED   metoprolol tartrate (LOPRESSOR) 25 MG tablet Take 25 mg by mouth at bedtime. (Patient not taking: Reported on 07/17/2023)   No facility-administered encounter medications on file as of 07/17/2023.    Past Medical History:  Diagnosis Date   Anxiety    Arthritis    Depression    Essential hypertension, benign 12/08/2016   GERD (gastroesophageal reflux disease)    Heart  murmur    AS A CHILD    High cholesterol 12/08/2016   Hyperlipidemia    Hypertension    PONV (postoperative nausea and vomiting)    Recurrent upper respiratory infection (URI)    Sleep apnea    DOES NOT USE CPAP    Stroke (HCC)    HX OF MINI STROKE 2016    TIA (transient ischemic attack) 2017    Past Surgical History:  Procedure Laterality Date   APPENDECTOMY     BIOPSY  12/10/2021   Procedure: BIOPSY;  Surgeon: Dolores Frame, MD;  Location: [facility];  Service:  Gastroenterology;;   COLONOSCOPY WITH PROPOFOL N/A 12/10/2021   Procedure: COLONOSCOPY WITH PROPOFOL;  Surgeon: Dolores Frame, MD;  Location: [facility];  Service: Gastroenterology;  Laterality: N/A;  815   JOINT REPLACEMENT     rt knee 11 yrs ago   KNEE ARTHROSCOPY Left    NASAL SINUS SURGERY     POLYPECTOMY  12/10/2021   Procedure: POLYPECTOMY;  Surgeon: Dolores Frame, MD;  Location: [facility];  Service: Gastroenterology;;   TOTAL KNEE ARTHROPLASTY Left 02/01/2020   Procedure: TOTAL KNEE ARTHROPLASTY;  Surgeon: Jene Every, MD;  Location: [facility];  Service: Orthopedics;  Laterality: Left;  general or spinal    Family History  Problem Relation Age of Onset   Lung cancer Mother    Diabetes Father    Stroke Father    Heart disease Father    Asthma Son     Social History   Socioeconomic History   Marital status: Divorced    Spouse name: Not on file   Number of children: 2   Years of education: 12   Highest education level: Not on file  Occupational History   Occupation: disabled  Tobacco Use   Smoking status: Former    Current packs/day: 0.00    Average packs/day: 1 pack/day for 40.3 years (40.3 ttl pk-yrs)    Types: Cigarettes    Start date: 21    Quit date: 10/04/2021    Years since quitting: 1.7   Smokeless tobacco: Never   Tobacco comments:    Former smoker   Advertising account planner   Vaping status: Never Used  Substance and Sexual Activity   Alcohol use: No   Drug use: No   Sexual activity: Not on file  Other Topics Concern   Not on file  Social History Narrative   Not on file   Social Drivers of Health   Financial Resource Strain: Not on file  Food Insecurity: No Food Insecurity (07/17/2022)   Hunger Vital Sign    Worried About Running Out of Food in the Last Year: Never true    Ran Out of Food in the Last Year: Never true  Transportation Needs: No Transportation Needs (07/17/2022)   PRAPARE - Administrator, Civil Service  (Medical): No    Lack of Transportation (Non-Medical): No  Physical Activity: Not on file  Stress: Not on file  Social Connections: Not on file  Intimate Partner Violence: Not At Risk (07/17/2022)   Humiliation, Afraid, Rape, and Kick questionnaire    Fear of Current or Ex-Partner: No    Emotionally Abused: No    Physically Abused: No    Sexually Abused: No   Review of Systems  Constitutional:  Negative for chills and fever.  HENT:  Negative for sore throat.   Respiratory:  Negative for cough and shortness of breath.   Cardiovascular:  Negative for chest pain, palpitations and leg swelling.  Gastrointestinal:  Negative for abdominal pain, blood in stool, constipation, diarrhea, nausea and vomiting.  Genitourinary:  Negative for dysuria and hematuria.  Musculoskeletal:  Negative for myalgias.  Skin:  Negative for itching and rash.  Neurological:  Negative for dizziness and headaches.  Psychiatric/Behavioral:  Negative for depression and suicidal ideas.    Objective    BP 126/64 (BP Location: Right Arm, Patient Position: Sitting, Cuff Size: Normal)   Pulse 74   Ht 5\' 4"  (1.626 m)   Wt 199 lb 9.6 oz (90.5 kg)   SpO2 95%   BMI 34.26 kg/m   Physical Exam Vitals reviewed.  Constitutional:      General: She is not in acute distress.    Appearance: Normal appearance. She is obese. She is not toxic-appearing.  HENT:     Head: Normocephalic and atraumatic.     Right Ear: External ear normal.     Left Ear: External ear normal.     Nose: Nose normal. No congestion or rhinorrhea.     Mouth/Throat:     Mouth: Mucous membranes are moist.     Pharynx: Oropharynx is clear. No oropharyngeal exudate or posterior oropharyngeal erythema.  Eyes:     General: No scleral icterus.    Extraocular Movements: Extraocular movements intact.     Conjunctiva/sclera: Conjunctivae normal.     Pupils: Pupils are equal, round, and reactive to light.  Cardiovascular:     Rate and Rhythm: Normal rate  and regular rhythm.     Pulses: Normal pulses.     Heart sounds: Normal heart sounds. No murmur heard.    No friction rub. No gallop.  Pulmonary:     Effort: Pulmonary effort is normal.     Breath sounds: Normal breath sounds. No wheezing, rhonchi or rales.  Abdominal:     General: Abdomen is flat. Bowel sounds are normal. There is no distension.     Palpations: Abdomen is soft.     Tenderness: There is no abdominal tenderness.  Musculoskeletal:        General: No swelling. Normal range of motion.     Cervical back: Normal range of motion.     Right lower leg: No edema.     Left lower leg: No edema.  Lymphadenopathy:     Cervical: No cervical adenopathy.  Skin:    General: Skin is warm and dry.     Capillary Refill: Capillary refill takes less than 2 seconds.     Coloration: Skin is not jaundiced.  Neurological:     General: No focal deficit present.     Mental Status: She is alert and oriented to person, place, and time.  Psychiatric:        Mood and Affect: Mood normal.        Behavior: Behavior normal.    Assessment & Plan:   Problem List Items Addressed This Visit       Essential hypertension, benign (Chronic)   She is currently prescribed amlodipine 5 mg daily and irbesartan-HCTZ 300-12.5 mg daily.  BP is adequately controlled on this regimen.  No medication changes were made today.      ILD (interstitial lung disease) (HCC)   RB ILD suspected based on CT chest from June.  Closely followed by pulmonology.  Repeat CT chest pending.      Chronic respiratory failure (HCC)   Nocturnal hypoxia.  She uses supplemental oxygen at bedtime.  Chronic rhinitis   Followed by allergy and immunology.  She uses fluticasone and azelastine nasal sprays routinely and takes cetirizine daily.      COPD (chronic obstructive pulmonary disease) (HCC)   Closely followed by pulmonology.  She is asymptomatic currently.  Pulmonary exam is unremarkable.  She is prescribed Breztri,  which she uses inconsistently.  She is also prescribed Roflumilast.  No medication changes were made today.      GERD (gastroesophageal reflux disease)   Symptoms are adequately controlled with pantoprazole 40 mg daily.  No medication changes are indicated today.      Hyperlipidemia   She is currently prescribed atorvastatin 80 mg daily and ezetimibe 10 mg daily.  Reports that labs were updated by her PCP in October 2024.  No records available for review.  Plan update lipid panel at follow-up in 3 months.      Chronic pain syndrome   Followed by pain management at [facility].  She is prescribed hydrocodone-acetaminophen 10-325 mg 3 times daily as needed for pain relief.  PDMP reviewed.      Anxiety and depression   Mood stable currently.  Anxiety adequately controlled.  She is prescribed fluoxetine 80 mg daily and alprazolam 0.25 mg daily as needed for anxiety relief.  This was managed by her PCP previously.  PDMP reviewed.  Prescription last filled in June 2024.  She reports taking alprazolam once per month. -UDS pending.  Controlled substance agreement signed today.      Return in about 3 months (around 10/14/2023).   Billie Lade, MD

## 2023-07-17 NOTE — Assessment & Plan Note (Signed)
She is currently prescribed atorvastatin 80 mg daily and ezetimibe 10 mg daily.  Reports that labs were updated by her PCP in October 2024.  No records available for review.  Plan update lipid panel at follow-up in 3 months.

## 2023-07-17 NOTE — Assessment & Plan Note (Signed)
RB ILD suspected based on CT chest from June.  Closely followed by pulmonology.  Repeat CT chest pending.

## 2023-07-17 NOTE — Assessment & Plan Note (Signed)
Followed by allergy and immunology.  She uses fluticasone and azelastine nasal sprays routinely and takes cetirizine daily.

## 2023-07-17 NOTE — Assessment & Plan Note (Signed)
Followed by pain management at [facility].  She is prescribed hydrocodone-acetaminophen 10-325 mg 3 times daily as needed for pain relief.  PDMP reviewed.

## 2023-07-17 NOTE — Patient Instructions (Signed)
It was a pleasure to see you today.  Thank you for giving [name] the opportunity to be involved in your care.  Below is a brief recap of your visit and next steps.  We will plan to see you again in 3 months.  Summary You have established care today No medication changes were made UDS and controlled substance agreement  We will follow up in 3 months

## 2023-07-17 NOTE — Assessment & Plan Note (Signed)
Closely followed by pulmonology.  She is asymptomatic currently.  Pulmonary exam is unremarkable.  She is prescribed Breztri, which she uses inconsistently.  She is also prescribed Roflumilast.  No medication changes were made today.

## 2023-07-17 NOTE — Assessment & Plan Note (Signed)
Symptoms are adequately controlled with pantoprazole 40 mg daily.  No medication changes are indicated today.

## 2023-07-17 NOTE — Assessment & Plan Note (Signed)
She is currently prescribed amlodipine 5 mg daily and irbesartan-HCTZ 300-12.5 mg daily.  BP is adequately controlled on this regimen.  No medication changes were made today.

## 2023-07-22 LAB — TOXASSURE SELECT 13 (MW), URINE

## 2023-08-25 ENCOUNTER — Telehealth: Payer: Self-pay | Admitting: Adult Health

## 2023-08-25 NOTE — Telephone Encounter (Signed)
 I called and spoke with patient, she states she began blowing yellow mucous out of her nose yesterday.  She says she is achy and felt like she may have had a fever last night, but did not take her temperature.  She is starting to get sob, she used her rescue inhaler yesterday and she usually does not have to use it.  She did the saline nasal rinse last night and it helped some.  She has a lot if pressure under her eyes and feels like her eyes are swollen.  She is drinking a lot of fluids, but has no appetite.  She was outside working out in the yard the day it was windy.  She says this time of year gets this.  Advised I would get a message to the provider of the day as Jenna Hancock and Dr. Marchelle Gearing are not available. And once we hear back, one of [name] will call her with the recommendations.  She verbalized understanding.  Dr. [name], patient is requesting an antibiotic for a sinus infection and some prednisone for increase sob. She said she was prescribed an antibiotic that started with a "D" and it worked really well.  It looks like she was given Doxycycline.  Please advise.  Thank you.

## 2023-08-25 NOTE — Telephone Encounter (Signed)
 I called the patient back and provided recommendations per Dr. [name].  The patient stated that she did test for Covid this morning and it was negative.  I offered to look for an appointment to have her seen in our office or she could be seen at an urgent care or her PCP office.  She stated she would get seen in [place] where she lives.  Advise to monitor her temperature and retest for covid in 2 days as she may have tested early and to get tested for the flu.  She verbalized understanding.  Nothing further needed.

## 2023-08-25 NOTE — Telephone Encounter (Signed)
 Patient has a sinus infection and is taking mussenix but would like an antibiotic and prednisone to be called in.   Pharmacy: [facility] in [place]

## 2023-08-25 NOTE — Telephone Encounter (Signed)
 Symptoms could be concerning for a respiratory virus such as flu, rsv covid. Are we able to bring her in for a sick visit tomorrow to get tested? She is still within the early days of symptom onset to benefit from anti-viral therapy. I would not want to prescribe an antibiotic until we rule that out. She can also buy a home test.

## 2023-10-16 ENCOUNTER — Ambulatory Visit (INDEPENDENT_AMBULATORY_CARE_PROVIDER_SITE_OTHER): Payer: Medicare HMO | Admitting: Internal Medicine

## 2023-10-16 ENCOUNTER — Encounter: Payer: Self-pay | Admitting: Internal Medicine

## 2023-10-16 VITALS — BP 138/58 | HR 73 | Ht 64.0 in | Wt 200.2 lb

## 2023-10-16 DIAGNOSIS — J849 Interstitial pulmonary disease, unspecified: Secondary | ICD-10-CM | POA: Diagnosis not present

## 2023-10-16 DIAGNOSIS — I10 Essential (primary) hypertension: Secondary | ICD-10-CM | POA: Diagnosis not present

## 2023-10-16 DIAGNOSIS — F32A Depression, unspecified: Secondary | ICD-10-CM

## 2023-10-16 DIAGNOSIS — F419 Anxiety disorder, unspecified: Secondary | ICD-10-CM

## 2023-10-16 DIAGNOSIS — Z1159 Encounter for screening for other viral diseases: Secondary | ICD-10-CM

## 2023-10-16 DIAGNOSIS — J449 Chronic obstructive pulmonary disease, unspecified: Secondary | ICD-10-CM | POA: Diagnosis not present

## 2023-10-16 DIAGNOSIS — E782 Mixed hyperlipidemia: Secondary | ICD-10-CM

## 2023-10-16 DIAGNOSIS — K219 Gastro-esophageal reflux disease without esophagitis: Secondary | ICD-10-CM

## 2023-10-16 DIAGNOSIS — E66811 Obesity, class 1: Secondary | ICD-10-CM

## 2023-10-16 DIAGNOSIS — G4733 Obstructive sleep apnea (adult) (pediatric): Secondary | ICD-10-CM

## 2023-10-16 MED ORDER — ATORVASTATIN CALCIUM 80 MG PO TABS: 80.0000 mg | ORAL_TABLET | Freq: Every day | ORAL | 3 refills | Status: AC

## 2023-10-16 NOTE — Assessment & Plan Note (Addendum)
 She is currently prescribed atorvastatin  80 mg daily and Zetia  10 mg daily.  Atorvastatin  refilled.  Repeat lipid panel ordered today.

## 2023-10-16 NOTE — Assessment & Plan Note (Signed)
 Remains adequately controlled on current antihypertensive regimen.  No medication changes are indicated today.

## 2023-10-16 NOTE — Progress Notes (Signed)
 Established Patient Office Visit  Subjective   Patient ID: Jenna Hancock, female    DOB: [birth year]  Age: 63 y.o. MRN: [id number]  Chief Complaint  Patient presents with   Care Management    Follow up    [name] returns today for routine follow-up.  She was last evaluated by me on 1/31 as a new patient presenting to establish care.  No medication changes were made at that time and 15-month follow-up was arranged.  There have been no acute interval events.  Today she reports feeling well and has no acute concerns to discuss.  Past Medical History:  Diagnosis Date   Anxiety    Arthritis    Depression    Essential hypertension, benign 12/08/2016   GERD (gastroesophageal reflux disease)    Heart murmur    AS A CHILD    High cholesterol 12/08/2016   Hyperlipidemia    Hypertension    PONV (postoperative nausea and vomiting)    Recurrent upper respiratory infection (URI)    Sleep apnea    DOES NOT USE CPAP    Stroke (HCC)    HX OF MINI STROKE 2016    TIA (transient ischemic attack) 2017   Past Surgical History:  Procedure Laterality Date   APPENDECTOMY     BIOPSY  12/10/2021   Procedure: BIOPSY;  Surgeon: Urban Garden, MD;  Location: [facility];  Service: Gastroenterology;;   COLONOSCOPY WITH PROPOFOL  N/A 12/10/2021   Procedure: COLONOSCOPY WITH PROPOFOL ;  Surgeon: Urban Garden, MD;  Location: [facility];  Service: Gastroenterology;  Laterality: N/A;  815   JOINT REPLACEMENT     rt knee 11 yrs ago   KNEE ARTHROSCOPY Left    NASAL SINUS SURGERY     POLYPECTOMY  12/10/2021   Procedure: POLYPECTOMY;  Surgeon: Urban Garden, MD;  [facility];  Service: Gastroenterology;;   TOTAL KNEE ARTHROPLASTY Left 02/01/2020   Procedure: TOTAL KNEE ARTHROPLASTY;  Surgeon: Orvan Blanch, MD;  Location: [facility];  Service: Orthopedics;  Laterality: Left;  general or spinal   Social History   Tobacco Use   Smoking status: Former     Current packs/day: 0.00    Average packs/day: 1 pack/day for 40.3 years (40.3 ttl pk-yrs)    Types: Cigarettes    Start date: 84    Quit date: 10/04/2021    Years since quitting: 2.0   Smokeless tobacco: Never   Tobacco comments:    Former smoker   Building services engineer status: Never Used  Substance Use Topics   Alcohol use: No   Drug use: No   Family History  Problem Relation Age of Onset   Lung cancer Mother    Diabetes Father    Stroke Father    Heart disease Father    Asthma Son    Allergies  Allergen Reactions   Ace Inhibitors Nausea Only   Bactrim Rash   Sulfa Antibiotics Rash   Review of Systems  Constitutional:  Negative for chills and fever.  HENT:  Negative for sore throat.   Respiratory:  Negative for cough and shortness of breath.   Cardiovascular:  Negative for chest pain, palpitations and leg swelling.  Gastrointestinal:  Negative for abdominal pain, blood in stool, constipation, diarrhea, nausea and vomiting.  Genitourinary:  Negative for dysuria and hematuria.  Musculoskeletal:  Negative for myalgias.  Skin:  Negative for itching and rash.  Neurological:  Negative for dizziness and headaches.  Psychiatric/Behavioral:  Negative for depression and suicidal ideas.      Objective:     BP (!) 138/58   Pulse 73   Ht 5\' 4"  (1.626 m)   Wt 200 lb 3.2 oz (90.8 kg)   SpO2 97%   BMI 34.36 kg/m  BP Readings from Last 3 Encounters:  10/16/23 (!) 138/58  07/17/23 126/64  04/27/23 (!) 104/40   Physical Exam Vitals reviewed.  Constitutional:      General: She is not in acute distress.    Appearance: Normal appearance. She is obese. She is not toxic-appearing.  HENT:     Head: Normocephalic and atraumatic.     Right Ear: External ear normal.     Left Ear: External ear normal.     Nose: Nose normal. No congestion or rhinorrhea.     Mouth/Throat:     Mouth: Mucous membranes are moist.     Pharynx: Oropharynx is clear. No oropharyngeal exudate or  posterior oropharyngeal erythema.  Eyes:     General: No scleral icterus.    Extraocular Movements: Extraocular movements intact.     Conjunctiva/sclera: Conjunctivae normal.     Pupils: Pupils are equal, round, and reactive to light.  Cardiovascular:     Rate and Rhythm: Normal rate and regular rhythm.     Pulses: Normal pulses.     Heart sounds: Normal heart sounds. No murmur heard.    No friction rub. No gallop.  Pulmonary:     Effort: Pulmonary effort is normal.     Breath sounds: Normal breath sounds. No wheezing, rhonchi or rales.  Abdominal:     General: Abdomen is flat. Bowel sounds are normal. There is no distension.     Palpations: Abdomen is soft.     Tenderness: There is no abdominal tenderness.  Musculoskeletal:        General: No swelling. Normal range of motion.     Cervical back: Normal range of motion.     Right lower leg: No edema.     Left lower leg: No edema.  Lymphadenopathy:     Cervical: No cervical adenopathy.  Skin:    General: Skin is warm and dry.     Capillary Refill: Capillary refill takes less than 2 seconds.     Coloration: Skin is not jaundiced.  Neurological:     General: No focal deficit present.     Mental Status: She is alert and oriented to person, place, and time.  Psychiatric:        Mood and Affect: Mood normal.        Behavior: Behavior normal.   Last CBC Lab Results  Component Value Date   WBC 12.7 (H) 09/08/2022   HGB 11.5 09/08/2022   HCT 35.2 09/08/2022   MCV 87 09/08/2022   MCH 28.5 09/08/2022   RDW 13.4 09/08/2022   PLT 215 09/08/2022   Last metabolic panel Lab Results  Component Value Date   GLUCOSE 89 07/18/2022   NA 135 07/18/2022   K 3.6 07/18/2022   CL 106 07/18/2022   CO2 23 07/18/2022   BUN 14 07/18/2022   CREATININE 0.48 07/18/2022   GFRNONAA >60 07/18/2022   CALCIUM  8.9 07/18/2022   PROT 7.3 07/18/2022   ALBUMIN 2.7 (L) 07/18/2022   BILITOT 0.4 07/18/2022   ALKPHOS 89 07/18/2022   AST 23 07/18/2022    ALT 24 07/18/2022   ANIONGAP 6 07/18/2022   Last lipids Lab Results  Component Value Date   CHOL  96 03/27/2021   HDL 35 (L) 03/27/2021   LDLCALC 48 03/27/2021   TRIG 65 03/27/2021   CHOLHDL 2.7 03/27/2021   Last thyroid  functions Lab Results  Component Value Date   TSH 1.29 08/05/2016   Last vitamin B12 and Folate Lab Results  Component Value Date   VITAMINB12 273 08/05/2016     Assessment & Plan:   Problem List Items Addressed This Visit       Essential hypertension, benign - Primary (Chronic)   Remains adequately controlled on current antihypertensive regimen.  No medication changes are indicated today.      COPD (chronic obstructive pulmonary disease) (HCC)   She is asymptomatic currently.  Pulmonary exam unremarkable.  She continues to use Breztri  essentially on an as-needed basis.  We discussed that it is intended for maintenance use.      Hyperlipidemia   She is currently prescribed atorvastatin  80 mg daily and Zetia  10 mg daily.  Atorvastatin  refilled.  Repeat lipid panel ordered today.      Anxiety and depression   Mood remains stable and anxiety adequately controlled with fluoxetine  and as needed use of alprazolam .  PDMP reviewed and remains appropriate.      Return in about 6 months (around 04/17/2024).   Tobi Fortes, MD

## 2023-10-16 NOTE — Assessment & Plan Note (Signed)
 Mood remains stable and anxiety adequately controlled with fluoxetine  and as needed use of alprazolam .  PDMP reviewed and remains appropriate.

## 2023-10-16 NOTE — Assessment & Plan Note (Signed)
 She is asymptomatic currently.  Pulmonary exam unremarkable.  She continues to use Breztri  essentially on an as-needed basis.  We discussed that it is intended for maintenance use.

## 2023-10-16 NOTE — Patient Instructions (Signed)
 It was a pleasure to see you today.  Thank you for giving [name] the opportunity to be involved in your care.  Below is a brief recap of your visit and next steps.  We will plan to see you again in 6 months.  Summary No medication changes today. Atorvastatin refilled.  Repeat labs ordered Follow up in 6 months

## 2023-10-20 LAB — CBC WITH DIFFERENTIAL/PLATELET
Basophils Absolute: 0 10*3/uL (ref 0.0–0.2)
Basos: 0 %
EOS (ABSOLUTE): 0.3 10*3/uL (ref 0.0–0.4)
Eos: 4 %
Hematocrit: 40.9 % (ref 34.0–46.6)
Hemoglobin: 12.9 g/dL (ref 11.1–15.9)
Immature Grans (Abs): 0 10*3/uL (ref 0.0–0.1)
Immature Granulocytes: 1 %
Lymphocytes Absolute: 2.4 10*3/uL (ref 0.7–3.1)
Lymphs: 32 %
MCH: 27.6 pg (ref 26.6–33.0)
MCHC: 31.5 g/dL (ref 31.5–35.7)
MCV: 87 fL (ref 79–97)
Monocytes Absolute: 0.6 10*3/uL (ref 0.1–0.9)
Monocytes: 8 %
Neutrophils Absolute: 4.2 10*3/uL (ref 1.4–7.0)
Neutrophils: 55 %
Platelets: 276 10*3/uL (ref 150–450)
RBC: 4.68 x10E6/uL (ref 3.77–5.28)
RDW: 13.8 % (ref 11.7–15.4)
WBC: 7.6 10*3/uL (ref 3.4–10.8)

## 2023-10-20 LAB — CMP14+EGFR
ALT: 22 IU/L (ref 0–32)
AST: 22 IU/L (ref 0–40)
Albumin: 4.4 g/dL (ref 3.9–4.9)
Alkaline Phosphatase: 153 IU/L — ABNORMAL HIGH (ref 44–121)
BUN/Creatinine Ratio: 19 (ref 12–28)
BUN: 14 mg/dL (ref 8–27)
Bilirubin Total: 0.3 mg/dL (ref 0.0–1.2)
CO2: 24 mmol/L (ref 20–29)
Calcium: 10.1 mg/dL (ref 8.7–10.3)
Chloride: 100 mmol/L (ref 96–106)
Creatinine, Ser: 0.73 mg/dL (ref 0.57–1.00)
Globulin, Total: 2.7 g/dL (ref 1.5–4.5)
Glucose: 90 mg/dL (ref 70–99)
Potassium: 4.8 mmol/L (ref 3.5–5.2)
Sodium: 139 mmol/L (ref 134–144)
Total Protein: 7.1 g/dL (ref 6.0–8.5)
eGFR: 92 mL/min/{1.73_m2} (ref >59)

## 2023-10-20 LAB — HCV INTERPRETATION

## 2023-10-20 LAB — LIPID PANEL
Chol/HDL Ratio: 4.2 ratio (ref 0.0–4.4)
Cholesterol, Total: 205 mg/dL — ABNORMAL HIGH (ref 100–199)
HDL: 49 mg/dL (ref >39)
LDL Chol Calc (NIH): 126 mg/dL — ABNORMAL HIGH (ref 0–99)
Triglycerides: 168 mg/dL — ABNORMAL HIGH (ref 0–149)
VLDL Cholesterol Cal: 30 mg/dL (ref 5–40)

## 2023-10-20 LAB — HEMOGLOBIN A1C
Est. average glucose Bld gHb Est-mCnc: 131 mg/dL
Hgb A1c MFr Bld: 6.2 % — ABNORMAL HIGH (ref 4.8–5.6)

## 2023-10-20 LAB — VITAMIN D 25 HYDROXY (VIT D DEFICIENCY, FRACTURES): Vit D, 25-Hydroxy: 35.3 ng/mL (ref 30.0–100.0)

## 2023-10-20 LAB — HCV AB W REFLEX TO QUANT PCR

## 2023-10-20 LAB — TSH+FREE T4
Free T4: 1.17 ng/dL (ref 0.82–1.77)
TSH: 1.42 u[IU]/mL (ref 0.450–4.500)

## 2023-10-20 LAB — B12 AND FOLATE PANEL
Folate: >20 ng/mL (ref >3.0)
Vitamin B-12: >2000 pg/mL — ABNORMAL HIGH (ref 232–1245)

## 2023-10-26 ENCOUNTER — Encounter: Payer: Self-pay | Admitting: Allergy

## 2023-10-26 ENCOUNTER — Other Ambulatory Visit: Payer: Self-pay

## 2023-10-26 ENCOUNTER — Ambulatory Visit: Payer: Medicare HMO | Admitting: Allergy

## 2023-10-26 ENCOUNTER — Ambulatory Visit (INDEPENDENT_AMBULATORY_CARE_PROVIDER_SITE_OTHER): Admitting: Allergy

## 2023-10-26 VITALS — BP 130/64 | HR 75 | Temp 97.6°F | Resp 19 | Ht 61.42 in | Wt 199.6 lb

## 2023-10-26 DIAGNOSIS — B999 Unspecified infectious disease: Secondary | ICD-10-CM

## 2023-10-26 DIAGNOSIS — J449 Chronic obstructive pulmonary disease, unspecified: Secondary | ICD-10-CM

## 2023-10-26 DIAGNOSIS — J3089 Other allergic rhinitis: Secondary | ICD-10-CM

## 2023-10-26 MED ORDER — FLUTICASONE PROPIONATE 50 MCG/ACT NA SUSP: 2.0000 | Freq: Every day | NASAL | 5 refills | Status: DC

## 2023-10-26 MED ORDER — AZELASTINE HCL 0.1 % NA SOLN: 1.0000 | Freq: Two times a day (BID) | NASAL | 5 refills | Status: AC | PRN

## 2023-10-26 MED ORDER — CETIRIZINE HCL 10 MG PO TABS: 10.0000 mg | ORAL_TABLET | Freq: Every day | ORAL | 5 refills | Status: DC

## 2023-10-26 NOTE — Progress Notes (Signed)
 Follow-up Note  RE: Jenna Hancock MRN: [id number] DOB: 24-Dec-1960 Date of Office Visit: 10/26/2023   History of present illness: Jenna Hancock is a 63 y.o. 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  She was last seen in the office on 04/27/2023 by Dr. Lydia Sams. Discussed the use of AI scribe software for clinical note transcription with the patient, who gave verbal consent to proceed.  Since November, she has been experiencing allergy  symptoms including rhinorrhea, nasal congestion, and sneezing. Her allergy  regimen includes daily Zyrtec , which has helped, along with Flonase  once daily and azelastine  nasal spray daily, both of which help manage her nasal symptoms.  She has a history of sensitivity to paint fumes, which exacerbated her symptoms during a previous painting project. She experienced significant discomfort but has since completed the painting and no longer has exposure to paint fumes.  Around Christmas, she experienced a sinus infection, which was notable as she typically has more frequent infections. She has not had pneumonia since last visit.  She uses Breztri  inhaler as needed and also has albuterol  available. She continues to follow up with her lung specialist.  Her blood pressure issues have stabilized.  She reports dizziness resolved with improving her blood pressure control.  She does have PCP Dr. Kermit Ped.  Review of systems: 10pt ROS negative unless noted above in HPI  Past medical/social/surgical/family history have been reviewed and are unchanged unless specifically indicated below.  No changes  Medication List: Current Outpatient Medications  Medication Sig Dispense Refill   ALPRAZolam  (XANAX ) 0.5 MG tablet Take 0.5 mg by mouth 2 (two) times daily as needed for anxiety.     amLODipine  (NORVASC ) 5 MG tablet Take 5 mg by mouth daily.     aspirin  325 MG tablet Take 325 mg by  mouth at bedtime.     atorvastatin  (LIPITOR ) 80 MG tablet Take 1 tablet (80 mg total) by mouth daily. 90 tablet 3   azelastine  (ASTELIN ) 0.1 % nasal spray Place 1 spray into both nostrils 2 (two) times daily as needed for rhinitis. Use in each nostril as directed 30 mL 5   Budeson-Glycopyrrol-Formoterol  (BREZTRI  AEROSPHERE) 160-9-4.8 MCG/ACT AERO Inhale 2 puffs into the lungs in the morning and at bedtime. 1 each 5   cetirizine  (ZYRTEC  ALLERGY ) 10 MG tablet Take 1 tablet (10 mg total) by mouth daily. 30 tablet 5   ezetimibe  (ZETIA ) 10 MG tablet Take 10 mg by mouth every evening.     FLUoxetine  (PROZAC ) 40 MG capsule Take 80 mg by mouth daily.     fluticasone  (FLONASE ) 50 MCG/ACT nasal spray Place 2 sprays into both nostrils daily. 16 g 5   folic acid  (FOLVITE ) 800 MCG tablet Take 800 mcg by mouth daily.     HYDROcodone -acetaminophen  (NORCO) 10-325 MG tablet Take 1 tablet by mouth in the morning and at bedtime.     IBU 800 MG tablet TAKE 1 TABLET EVERY DAY AS NEEDED     ipratropium-albuterol  (DUONEB) 0.5-2.5 (3) MG/3ML SOLN Take 3 mLs by nebulization 4 (four) times daily. 360 mL 2   irbesartan -hydrochlorothiazide  (AVALIDE) 300-12.5 MG tablet Take 1 tablet by mouth daily.     Multiple Vitamins-Minerals (MULTIVITAMIN WITH MINERALS) tablet Take 1 tablet by mouth daily.     OVER THE COUNTER MEDICATION Take 1 capsule by mouth daily. Fish Oil     pantoprazole  (PROTONIX ) 40 MG tablet Take 1 tablet (40 mg total) by mouth daily.  roflumilast  (DALIRESP ) 500 MCG TABS tablet TAKE 1 TABLET EVERY DAY 90 tablet 3   cyclobenzaprine (FLEXERIL) 10 MG tablet Take 10 mg by mouth 3 (three) times daily as needed. (Patient not taking: Reported on 10/26/2023)     furosemide (LASIX) 20 MG tablet Take 20 mg by mouth daily. (Patient not taking: Reported on 04/27/2023)     metoprolol  tartrate (LOPRESSOR ) 25 MG tablet Take 25 mg by mouth at bedtime. (Patient not taking: Reported on 07/17/2023)     No current  facility-administered medications for this visit.     Known medication allergies: Allergies  Allergen Reactions   Ace Inhibitors Nausea Only   Bactrim Rash   Sulfa Antibiotics Rash     Physical examination: Blood pressure 130/64, pulse 75, temperature 97.6 F (36.4 C), temperature source Temporal, resp. rate 19, height 5' 1.42" (1.56 m), weight 199 lb 9.6 oz (90.5 kg), SpO2 93%.  General: Alert, interactive, in no acute distress. HEENT: PERRLA, TMs pearly gray, turbinates mildly edematous without discharge, post-pharynx non erythematous. Neck: Supple without lymphadenopathy. Lungs: Clear to auscultation without wheezing, rhonchi or rales. {no increased work of breathing. CV: Normal S1, S2 without murmurs. Abdomen: Nondistended, nontender. Skin: Warm and dry, without lesions or rashes. Extremities:  No clubbing, cyanosis or edema. Neuro:   Grossly intact.  Diagnositics/Labs: Chronic  Assessment and plan: Allergic Rhinitis - Positive skin test 06/2022: mouse, mold, cockroach.   - Avoidance measures discussed. - Use Flonase  2 sprays each nostril daily. Aim upward and outward. Hold 2-3 days if you see nosebleeds.   - Use Azelastine  2 sprays each nostril twice daily as needed congestion, drainage, sneezing.. Aim upward and outward. - Use Zyrtec  10 mg daily.  You can additional dose if needed in the day.   Recurrent Infections- sinusitis, pneumonia  - Normal immunoglobulins, normal tetanus/diptheria response, initially with low pneumococcal titers but with robust pneumococcal response after Pneumovax.  Lymphocyte subsets were also normal.  - ENT with mild thickening of sinus but normal scope.    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  Also on Breztri  and PRN Albuterol /Duonebs.  - Work on tobacco cessation   Follow-up in 6 months or sooner if needed  I appreciate the opportunity to take part in [facility] care. Please do not hesitate to contact me with questions.  Sincerely,   Catha Clink, MD Allergy /Immunology [facility]

## 2023-10-26 NOTE — Patient Instructions (Addendum)
 Allergic Rhinitis - Positive skin test 06/2022: mouse, mold, cockroach.   - Avoidance measures discussed. - Use Flonase  2 sprays each nostril daily. Aim upward and outward. Hold 2-3 days if you see nosebleeds.   - Use Azelastine  2 sprays each nostril twice daily as needed congestion, drainage, sneezing.. Aim upward and outward. - Use Zyrtec  10 mg daily.  You can additional dose if needed in the day.   Recurrent Infections- sinusitis, pneumonia  - Normal immunoglobulins, normal tetanus/diptheria response, initially with low pneumococcal titers but with robust pneumococcal response after Pneumovax.  Lymphocyte subsets were also normal.  - ENT with mild thickening of sinus but normal scope.    Possible ILD/COPD - Followed by Pulm.  Also on Breztri  and PRN Albuterol /Duonebs.  - Work on tobacco cessation   Follow-up in 6 months or sooner if needed

## 2023-11-02 ENCOUNTER — Other Ambulatory Visit: Payer: Self-pay

## 2023-11-02 MED ORDER — FLUOXETINE HCL 40 MG PO CAPS: 80.0000 mg | ORAL_CAPSULE | Freq: Every day | ORAL | 0 refills | Status: DC

## 2023-11-03 ENCOUNTER — Telehealth: Payer: Self-pay

## 2023-11-03 NOTE — Telephone Encounter (Signed)
 Copied from CRM [id number]. Topic: General - Other >> Sep 30, 2023  4:11 PM Jenna Hancock wrote: Reason for CRM: Returning a call to [facility], would like a call back.  Completed was a result note

## 2023-11-04 ENCOUNTER — Other Ambulatory Visit: Payer: Self-pay | Admitting: Internal Medicine

## 2023-11-07 ENCOUNTER — Other Ambulatory Visit: Payer: Self-pay | Admitting: Internal Medicine

## 2023-12-14 ENCOUNTER — Other Ambulatory Visit: Payer: Self-pay | Admitting: Internal Medicine

## 2023-12-25 ENCOUNTER — Other Ambulatory Visit: Payer: Self-pay

## 2023-12-25 DIAGNOSIS — F419 Anxiety disorder, unspecified: Secondary | ICD-10-CM

## 2023-12-25 DIAGNOSIS — J849 Interstitial pulmonary disease, unspecified: Secondary | ICD-10-CM

## 2023-12-25 DIAGNOSIS — I10 Essential (primary) hypertension: Secondary | ICD-10-CM

## 2023-12-25 DIAGNOSIS — K219 Gastro-esophageal reflux disease without esophagitis: Secondary | ICD-10-CM

## 2023-12-25 MED ORDER — PANTOPRAZOLE SODIUM 40 MG PO TBEC: 40.0000 mg | DELAYED_RELEASE_TABLET | Freq: Every day | ORAL | 2 refills | Status: AC

## 2023-12-25 MED ORDER — AMLODIPINE BESYLATE 5 MG PO TABS
5.0000 mg | ORAL_TABLET | Freq: Every day | ORAL | 2 refills | Status: AC
Start: 2023-12-25 — End: ?

## 2023-12-25 MED ORDER — IPRATROPIUM-ALBUTEROL 0.5-2.5 (3) MG/3ML IN SOLN: 3.0000 mL | Freq: Four times a day (QID) | RESPIRATORY_TRACT | 2 refills | Status: DC

## 2023-12-25 MED ORDER — ALPRAZOLAM 0.5 MG PO TABS: 0.5000 mg | ORAL_TABLET | Freq: Two times a day (BID) | ORAL | 0 refills | Status: AC | PRN

## 2023-12-25 MED ORDER — IRBESARTAN-HYDROCHLOROTHIAZIDE 300-12.5 MG PO TABS: 1.0000 | ORAL_TABLET | Freq: Every day | ORAL | 2 refills | Status: AC

## 2023-12-30 ENCOUNTER — Other Ambulatory Visit (HOSPITAL_COMMUNITY): Payer: Self-pay

## 2023-12-30 ENCOUNTER — Telehealth: Payer: Self-pay | Admitting: Pharmacy Technician

## 2023-12-30 NOTE — Telephone Encounter (Signed)
 Pharmacy Patient Advocate Encounter   Received notification from CoverMyMeds that prior authorization for ALPRAZolam  0.5MG  tablets is required/requested.   Insurance verification completed.   The patient is insured through [employer] .   Per test claim: PA required; PA submitted to above mentioned insurance via [employer] Key/confirmation #/EOC [id number] Status is pending

## 2023-12-30 NOTE — Telephone Encounter (Signed)
 Pharmacy Patient Advocate Encounter  Received notification from [employer] that Prior Authorization for ALPRAZolam  0.5MG  tablets  has been APPROVED from 12/30/2023 to 06/15/2024. Ran test claim, Copay is $2.14. This test claim was processed through [facility]- copay amounts may vary at other pharmacies due to pharmacy/plan contracts, or as the patient moves through the different stages of their insurance plan.   PA #/Case ID/Reference #: [id number]

## 2024-01-27 ENCOUNTER — Other Ambulatory Visit: Payer: Self-pay

## 2024-02-28 ENCOUNTER — Inpatient Hospital Stay (HOSPITAL_COMMUNITY)
Admission: EM | Admit: 2024-02-28 | Discharge: 2024-03-03 | DRG: 193 | Disposition: A | Discharge status: Home / Self Care | Attending: Internal Medicine | Admitting: Internal Medicine

## 2024-02-28 ENCOUNTER — Encounter (HOSPITAL_COMMUNITY): Payer: Self-pay | Admitting: Emergency Medicine

## 2024-02-28 ENCOUNTER — Other Ambulatory Visit: Payer: Self-pay

## 2024-02-28 ENCOUNTER — Emergency Department (HOSPITAL_COMMUNITY)

## 2024-02-28 DIAGNOSIS — Z888 Allergy status to other drugs, medicaments and biological substances status: Secondary | ICD-10-CM

## 2024-02-28 DIAGNOSIS — Z8673 Personal history of transient ischemic attack (TIA), and cerebral infarction without residual deficits: Secondary | ICD-10-CM

## 2024-02-28 DIAGNOSIS — G894 Chronic pain syndrome: Secondary | ICD-10-CM | POA: Diagnosis present

## 2024-02-28 DIAGNOSIS — J449 Chronic obstructive pulmonary disease, unspecified: Secondary | ICD-10-CM | POA: Diagnosis present

## 2024-02-28 DIAGNOSIS — J440 Chronic obstructive pulmonary disease with acute lower respiratory infection: Secondary | ICD-10-CM | POA: Diagnosis present

## 2024-02-28 DIAGNOSIS — Z79899 Other long term (current) drug therapy: Secondary | ICD-10-CM

## 2024-02-28 DIAGNOSIS — K219 Gastro-esophageal reflux disease without esophagitis: Secondary | ICD-10-CM | POA: Diagnosis present

## 2024-02-28 DIAGNOSIS — J310 Chronic rhinitis: Secondary | ICD-10-CM | POA: Diagnosis present

## 2024-02-28 DIAGNOSIS — I10 Essential (primary) hypertension: Secondary | ICD-10-CM | POA: Diagnosis present

## 2024-02-28 DIAGNOSIS — Z1152 Encounter for screening for COVID-19: Secondary | ICD-10-CM | POA: Diagnosis not present

## 2024-02-28 DIAGNOSIS — J9621 Acute and chronic respiratory failure with hypoxia: Secondary | ICD-10-CM | POA: Diagnosis present

## 2024-02-28 DIAGNOSIS — G4733 Obstructive sleep apnea (adult) (pediatric): Secondary | ICD-10-CM | POA: Diagnosis present

## 2024-02-28 DIAGNOSIS — Z825 Family history of asthma and other chronic lower respiratory diseases: Secondary | ICD-10-CM

## 2024-02-28 DIAGNOSIS — Z882 Allergy status to sulfonamides status: Secondary | ICD-10-CM | POA: Diagnosis not present

## 2024-02-28 DIAGNOSIS — F419 Anxiety disorder, unspecified: Secondary | ICD-10-CM | POA: Diagnosis present

## 2024-02-28 DIAGNOSIS — J181 Lobar pneumonia, unspecified organism: Secondary | ICD-10-CM | POA: Diagnosis not present

## 2024-02-28 DIAGNOSIS — Z833 Family history of diabetes mellitus: Secondary | ICD-10-CM | POA: Diagnosis not present

## 2024-02-28 DIAGNOSIS — J439 Emphysema, unspecified: Secondary | ICD-10-CM | POA: Diagnosis present

## 2024-02-28 DIAGNOSIS — J441 Chronic obstructive pulmonary disease with (acute) exacerbation: Secondary | ICD-10-CM | POA: Diagnosis present

## 2024-02-28 DIAGNOSIS — Z8249 Family history of ischemic heart disease and other diseases of the circulatory system: Secondary | ICD-10-CM

## 2024-02-28 DIAGNOSIS — F17210 Nicotine dependence, cigarettes, uncomplicated: Secondary | ICD-10-CM | POA: Diagnosis present

## 2024-02-28 DIAGNOSIS — J9611 Chronic respiratory failure with hypoxia: Principal | ICD-10-CM

## 2024-02-28 DIAGNOSIS — E66812 Obesity, class 2: Secondary | ICD-10-CM | POA: Diagnosis present

## 2024-02-28 DIAGNOSIS — E7800 Pure hypercholesterolemia, unspecified: Secondary | ICD-10-CM | POA: Diagnosis present

## 2024-02-28 DIAGNOSIS — J9601 Acute respiratory failure with hypoxia: Secondary | ICD-10-CM | POA: Diagnosis not present

## 2024-02-28 DIAGNOSIS — Z7982 Long term (current) use of aspirin: Secondary | ICD-10-CM

## 2024-02-28 DIAGNOSIS — R0609 Other forms of dyspnea: Secondary | ICD-10-CM | POA: Diagnosis present

## 2024-02-28 DIAGNOSIS — Z96652 Presence of left artificial knee joint: Secondary | ICD-10-CM | POA: Diagnosis present

## 2024-02-28 DIAGNOSIS — F1120 Opioid dependence, uncomplicated: Secondary | ICD-10-CM | POA: Diagnosis present

## 2024-02-28 DIAGNOSIS — F32A Depression, unspecified: Secondary | ICD-10-CM | POA: Diagnosis present

## 2024-02-28 DIAGNOSIS — Z823 Family history of stroke: Secondary | ICD-10-CM

## 2024-02-28 DIAGNOSIS — J849 Interstitial pulmonary disease, unspecified: Secondary | ICD-10-CM | POA: Diagnosis not present

## 2024-02-28 DIAGNOSIS — J9610 Chronic respiratory failure, unspecified whether with hypoxia or hypercapnia: Secondary | ICD-10-CM | POA: Diagnosis present

## 2024-02-28 DIAGNOSIS — J189 Pneumonia, unspecified organism: Secondary | ICD-10-CM | POA: Diagnosis not present

## 2024-02-28 LAB — CBC
HCT: 36.9 % (ref 36.0–46.0)
Hemoglobin: 12 g/dL (ref 12.0–15.0)
MCH: 28.6 pg (ref 26.0–34.0)
MCHC: 32.5 g/dL (ref 30.0–36.0)
MCV: 87.9 fL (ref 80.0–100.0)
Platelets: 251 K/uL (ref 150–400)
RBC: 4.2 MIL/uL (ref 3.87–5.11)
RDW: 13.6 % (ref 11.5–15.5)
WBC: 17.5 K/uL — ABNORMAL HIGH (ref 4.0–10.5)
nRBC: 0 % (ref 0.0–0.2)

## 2024-02-28 LAB — RESP PANEL BY RT-PCR (RSV, FLU A&B, COVID)  RVPGX2
Influenza A by PCR: NEGATIVE
Influenza B by PCR: NEGATIVE
Resp Syncytial Virus by PCR: NEGATIVE
SARS Coronavirus 2 by RT PCR: NEGATIVE

## 2024-02-28 LAB — HIV ANTIBODY (ROUTINE TESTING W REFLEX): HIV Screen 4th Generation wRfx: NONREACTIVE

## 2024-02-28 LAB — BASIC METABOLIC PANEL WITH GFR
Anion gap: 15 (ref 5–15)
BUN: 15 mg/dL (ref 8–23)
CO2: 23 mmol/L (ref 22–32)
Calcium: 9.1 mg/dL (ref 8.9–10.3)
Chloride: 98 mmol/L (ref 98–111)
Creatinine, Ser: 0.79 mg/dL (ref 0.44–1.00)
GFR, Estimated: >60 mL/min (ref >60)
Glucose, Bld: 138 mg/dL — ABNORMAL HIGH (ref 70–99)
Potassium: 3.2 mmol/L — ABNORMAL LOW (ref 3.5–5.1)
Sodium: 136 mmol/L (ref 135–145)

## 2024-02-28 LAB — LACTIC ACID, PLASMA: Lactic Acid, Venous: 0.9 mmol/L (ref 0.5–1.9)

## 2024-02-28 MED ORDER — SODIUM CHLORIDE 0.9 % IV SOLN
1.0000 g | Freq: Once | INTRAVENOUS | Status: AC
  Administered 2024-02-28: 1 g via INTRAVENOUS
  Filled 2024-02-28: qty 10

## 2024-02-28 MED ORDER — EZETIMIBE 10 MG PO TABS
10.0000 mg | ORAL_TABLET | Freq: Every evening | ORAL | Status: DC
  Administered 2024-02-28 – 2024-03-02 (×4): 10 mg via ORAL
  Filled 2024-02-28 (×4): qty 1

## 2024-02-28 MED ORDER — PANTOPRAZOLE SODIUM 40 MG PO TBEC
40.0000 mg | DELAYED_RELEASE_TABLET | Freq: Every evening | ORAL | Status: DC
  Administered 2024-02-28 – 2024-03-02 (×4): 40 mg via ORAL
  Filled 2024-02-28 (×4): qty 1

## 2024-02-28 MED ORDER — BUDESON-GLYCOPYRROL-FORMOTEROL 160-9-4.8 MCG/ACT IN AERO
2.0000 | INHALATION_SPRAY | Freq: Two times a day (BID) | RESPIRATORY_TRACT | Status: DC
  Administered 2024-02-28 – 2024-03-02 (×7): 2 via RESPIRATORY_TRACT
  Filled 2024-02-28: qty 5.9

## 2024-02-28 MED ORDER — ROFLUMILAST 500 MCG PO TABS
500.0000 ug | ORAL_TABLET | Freq: Every day | ORAL | Status: DC
  Administered 2024-02-29 – 2024-03-03 (×4): 500 ug via ORAL
  Filled 2024-02-28 (×8): qty 1

## 2024-02-28 MED ORDER — TRAZODONE HCL 50 MG PO TABS
50.0000 mg | ORAL_TABLET | Freq: Every evening | ORAL | Status: DC | PRN
  Administered 2024-02-29: 50 mg via ORAL
  Filled 2024-02-28: qty 1

## 2024-02-28 MED ORDER — NICOTINE 21 MG/24HR TD PT24
21.0000 mg | MEDICATED_PATCH | Freq: Every day | TRANSDERMAL | Status: DC
  Administered 2024-02-28 – 2024-03-03 (×5): 21 mg via TRANSDERMAL
  Filled 2024-02-28 (×5): qty 1

## 2024-02-28 MED ORDER — AMLODIPINE BESYLATE 5 MG PO TABS
5.0000 mg | ORAL_TABLET | Freq: Every day | ORAL | Status: DC
  Administered 2024-02-28 – 2024-03-03 (×5): 5 mg via ORAL
  Filled 2024-02-28 (×5): qty 1

## 2024-02-28 MED ORDER — FLUTICASONE PROPIONATE 50 MCG/ACT NA SUSP
2.0000 | Freq: Every day | NASAL | Status: DC
  Administered 2024-02-28: 2 via NASAL
  Filled 2024-02-28: qty 16

## 2024-02-28 MED ORDER — ONDANSETRON HCL 4 MG/2ML IJ SOLN: 4.0000 mg | Freq: Four times a day (QID) | INTRAMUSCULAR | Status: DC | PRN

## 2024-02-28 MED ORDER — FLUTICASONE PROPIONATE 50 MCG/ACT NA SUSP: 2.0000 | Freq: Every day | NASAL | Status: DC | PRN

## 2024-02-28 MED ORDER — IRBESARTAN 150 MG PO TABS
300.0000 mg | ORAL_TABLET | Freq: Every day | ORAL | Status: DC
  Administered 2024-02-28 – 2024-03-03 (×5): 300 mg via ORAL
  Filled 2024-02-28 (×5): qty 2

## 2024-02-28 MED ORDER — ONDANSETRON HCL 4 MG PO TABS: 4.0000 mg | ORAL_TABLET | Freq: Four times a day (QID) | ORAL | Status: DC | PRN

## 2024-02-28 MED ORDER — IPRATROPIUM-ALBUTEROL 0.5-2.5 (3) MG/3ML IN SOLN
3.0000 mL | Freq: Four times a day (QID) | RESPIRATORY_TRACT | Status: AC
  Administered 2024-02-29 – 2024-03-02 (×11): 3 mL via RESPIRATORY_TRACT
  Filled 2024-02-28 (×11): qty 3

## 2024-02-28 MED ORDER — AZITHROMYCIN 250 MG PO TABS
500.0000 mg | ORAL_TABLET | Freq: Every day | ORAL | Status: AC
Start: 2024-02-29 — End: 2024-03-03
  Administered 2024-02-29 – 2024-03-03 (×4): 500 mg via ORAL
  Filled 2024-02-28 (×4): qty 2

## 2024-02-28 MED ORDER — FLUOXETINE HCL 20 MG PO CAPS
80.0000 mg | ORAL_CAPSULE | Freq: Every day | ORAL | Status: DC
  Administered 2024-02-28 – 2024-03-03 (×5): 80 mg via ORAL
  Filled 2024-02-28 (×5): qty 4

## 2024-02-28 MED ORDER — MAGNESIUM SULFATE 2 GM/50ML IV SOLN
2.0000 g | Freq: Once | INTRAVENOUS | Status: AC
  Administered 2024-02-28: 2 g via INTRAVENOUS
  Filled 2024-02-28: qty 50

## 2024-02-28 MED ORDER — ALPRAZOLAM 0.5 MG PO TABS
0.5000 mg | ORAL_TABLET | Freq: Two times a day (BID) | ORAL | Status: DC | PRN
Start: 2024-02-28 — End: 2024-03-03
  Administered 2024-02-29 – 2024-03-02 (×3): 0.5 mg via ORAL
  Filled 2024-02-28 (×4): qty 1

## 2024-02-28 MED ORDER — ACETAMINOPHEN 650 MG RE SUPP: 650.0000 mg | Freq: Four times a day (QID) | RECTAL | Status: DC | PRN

## 2024-02-28 MED ORDER — IRBESARTAN-HYDROCHLOROTHIAZIDE 300-12.5 MG PO TABS: 1.0000 | ORAL_TABLET | Freq: Every day | ORAL | Status: DC

## 2024-02-28 MED ORDER — SODIUM CHLORIDE 0.9 % IV SOLN
500.0000 mg | Freq: Once | INTRAVENOUS | Status: AC
  Administered 2024-02-28: 500 mg via INTRAVENOUS
  Filled 2024-02-28: qty 5

## 2024-02-28 MED ORDER — LORATADINE 10 MG PO TABS
10.0000 mg | ORAL_TABLET | Freq: Every day | ORAL | Status: DC
  Administered 2024-02-28 – 2024-03-02 (×4): 10 mg via ORAL
  Filled 2024-02-28 (×4): qty 1

## 2024-02-28 MED ORDER — ATORVASTATIN CALCIUM 40 MG PO TABS
80.0000 mg | ORAL_TABLET | Freq: Every evening | ORAL | Status: DC
  Administered 2024-02-28 – 2024-03-02 (×4): 80 mg via ORAL
  Filled 2024-02-28 (×4): qty 2

## 2024-02-28 MED ORDER — IPRATROPIUM-ALBUTEROL 0.5-2.5 (3) MG/3ML IN SOLN
3.0000 mL | RESPIRATORY_TRACT | Status: DC
  Administered 2024-02-28 (×3): 3 mL via RESPIRATORY_TRACT
  Filled 2024-02-28 (×3): qty 3

## 2024-02-28 MED ORDER — IPRATROPIUM-ALBUTEROL 0.5-2.5 (3) MG/3ML IN SOLN
3.0000 mL | Freq: Once | RESPIRATORY_TRACT | Status: AC
  Administered 2024-02-28: 3 mL via RESPIRATORY_TRACT
  Filled 2024-02-28: qty 3

## 2024-02-28 MED ORDER — METHYLPREDNISOLONE SODIUM SUCC 40 MG IJ SOLR
40.0000 mg | Freq: Two times a day (BID) | INTRAMUSCULAR | Status: DC
  Administered 2024-02-28 – 2024-03-02 (×6): 40 mg via INTRAVENOUS
  Filled 2024-02-28 (×6): qty 1

## 2024-02-28 MED ORDER — GUAIFENESIN ER 600 MG PO TB12
1200.0000 mg | ORAL_TABLET | Freq: Two times a day (BID) | ORAL | Status: DC
  Administered 2024-02-28 – 2024-03-03 (×9): 1200 mg via ORAL
  Filled 2024-02-28 (×9): qty 2

## 2024-02-28 MED ORDER — ENOXAPARIN SODIUM 40 MG/0.4ML IJ SOSY
40.0000 mg | PREFILLED_SYRINGE | INTRAMUSCULAR | Status: DC
  Administered 2024-02-28 – 2024-03-02 (×4): 40 mg via SUBCUTANEOUS
  Filled 2024-02-28 (×4): qty 0.4

## 2024-02-28 MED ORDER — OXYCODONE HCL 5 MG PO TABS: 5.0000 mg | ORAL_TABLET | ORAL | Status: DC | PRN

## 2024-02-28 MED ORDER — HYDROCHLOROTHIAZIDE 12.5 MG PO TABS
12.5000 mg | ORAL_TABLET | Freq: Every day | ORAL | Status: DC
  Administered 2024-02-28 – 2024-03-03 (×5): 12.5 mg via ORAL
  Filled 2024-02-28 (×5): qty 1

## 2024-02-28 MED ORDER — IPRATROPIUM-ALBUTEROL 0.5-2.5 (3) MG/3ML IN SOLN: 3.0000 mL | RESPIRATORY_TRACT | Status: DC | PRN

## 2024-02-28 MED ORDER — FENTANYL CITRATE (PF) 100 MCG/2ML IJ SOLN: 25.0000 ug | INTRAMUSCULAR | Status: DC | PRN

## 2024-02-28 MED ORDER — ASPIRIN 325 MG PO TABS
325.0000 mg | ORAL_TABLET | Freq: Every day | ORAL | Status: DC
  Administered 2024-02-28 – 2024-03-02 (×4): 325 mg via ORAL
  Filled 2024-02-28 (×4): qty 1

## 2024-02-28 MED ORDER — BISACODYL 5 MG PO TBEC: 5.0000 mg | DELAYED_RELEASE_TABLET | Freq: Every day | ORAL | Status: DC | PRN

## 2024-02-28 MED ORDER — ACETAMINOPHEN 325 MG PO TABS
650.0000 mg | ORAL_TABLET | Freq: Four times a day (QID) | ORAL | Status: DC | PRN
  Administered 2024-02-28 – 2024-03-01 (×4): 650 mg via ORAL
  Filled 2024-02-28 (×4): qty 2

## 2024-02-28 MED ORDER — SODIUM CHLORIDE 0.9 % IV SOLN
2.0000 g | INTRAVENOUS | Status: AC
  Administered 2024-02-29 – 2024-03-03 (×4): 2 g via INTRAVENOUS
  Filled 2024-02-28 (×4): qty 20

## 2024-02-28 MED ORDER — POTASSIUM CHLORIDE CRYS ER 20 MEQ PO TBCR
40.0000 meq | EXTENDED_RELEASE_TABLET | Freq: Once | ORAL | Status: AC
  Administered 2024-02-28: 40 meq via ORAL
  Filled 2024-02-28: qty 2

## 2024-02-28 MED ORDER — SODIUM CHLORIDE 0.9 % IV SOLN
INTRAVENOUS | Status: AC
Start: 2024-02-28 — End: 2024-02-29

## 2024-02-28 NOTE — ED Provider Notes (Signed)
  EMERGENCY DEPARTMENT AT [facility] Provider Note   CSN: [id number] Arrival date & time: 02/28/24  9175     Patient presents with: Shortness of Breath   Jenna Hancock is a 63 y.o. female.  She has a history of COPD and uses oxygen  at night.  Complaining of cough productive of some brown sputum, shortness of breath, fevers up to 101 that started 3 days ago.  She was seen in [place] and put on steroids and Tessalon  Perles.  She does not feel her symptoms are improving and actually getting worse.  She thinks she has pneumonia.  She is a smoker but has cut back through this illness.  Uses oxygen  at night, has for years.  Had 1 episode of vomiting and diarrhea today.   The history is provided by the patient.  Shortness of Breath Severity:  Moderate Onset quality:  Gradual Duration:  4 days Timing:  Constant Progression:  Worsening Chronicity:  Recurrent Relieved by:  Nothing Worsened by:  Activity and coughing Ineffective treatments:  Inhaler Associated symptoms: cough, fever, sputum production, vomiting and wheezing   Associated symptoms: no abdominal pain, no chest pain and no hemoptysis   Risk factors: tobacco use        Prior to Admission medications   Medication Sig Start Date End Date Taking? Authorizing Provider  ALPRAZolam  (XANAX ) 0.5 MG tablet Take 1 tablet (0.5 mg total) by mouth 2 (two) times daily as needed for anxiety. 12/25/23   Bevely Doffing, FNP  amLODipine  (NORVASC ) 5 MG tablet Take 1 tablet (5 mg total) by mouth daily. 12/25/23   Bevely Doffing, FNP  aspirin  325 MG tablet Take 325 mg by mouth at bedtime.    [provider]  atorvastatin  (LIPITOR ) 80 MG tablet Take 1 tablet (80 mg total) by mouth daily. 10/16/23   Melvenia Manus BRAVO, MD  azelastine  (ASTELIN ) 0.1 % nasal spray Place 1 spray into both nostrils 2 (two) times daily as needed for rhinitis. Use in each nostril as directed 10/26/23   Jeneal Danita Macintosh, MD   Budeson-Glycopyrrol-Formoterol  (BREZTRI  AEROSPHERE) 160-9-4.8 MCG/ACT AERO Inhale 2 puffs into the lungs in the morning and at bedtime. 04/10/23   Parrett, Madelin RAMAN, NP  cetirizine  (ZYRTEC  ALLERGY ) 10 MG tablet Take 1 tablet (10 mg total) by mouth daily. 10/26/23   Jeneal Danita Macintosh, MD  ezetimibe  (ZETIA ) 10 MG tablet Take 10 mg by mouth every evening. 11/24/19   [provider]  FLUoxetine  (PROZAC ) 40 MG capsule TAKE 2 CAPSULES EVERY DAY 01/27/24   Bevely Doffing, FNP  fluticasone  (FLONASE ) 50 MCG/ACT nasal spray Place 2 sprays into both nostrils daily. 10/26/23   Jeneal Danita Macintosh, MD  folic acid  (FOLVITE ) 800 MCG tablet Take 800 mcg by mouth daily.    [provider]  HYDROcodone -acetaminophen  (NORCO) 10-325 MG tablet Take 1 tablet by mouth in the morning and at bedtime.    [provider]  IBU 800 MG tablet TAKE 1 TABLET EVERY DAY AS NEEDED 03/18/23   [provider]  ipratropium-albuterol  (DUONEB) 0.5-2.5 (3) MG/3ML SOLN Take 3 mLs by nebulization 4 (four) times daily. 12/25/23   Bevely Doffing, FNP  irbesartan -hydrochlorothiazide  (AVALIDE) 300-12.5 MG tablet Take 1 tablet by mouth daily. 12/25/23   Bevely Doffing, FNP  Multiple Vitamins-Minerals (MULTIVITAMIN WITH MINERALS) tablet Take 1 tablet by mouth daily.    [provider]  OVER THE COUNTER MEDICATION Take 1 capsule by mouth daily. Fish Oil    [provider]  pantoprazole  (PROTONIX ) 40 MG tablet Take 1 tablet (40 mg total) by mouth daily. 12/25/23   Bevely Doffing, FNP  roflumilast  (DALIRESP ) 500 MCG TABS tablet TAKE 1 TABLET EVERY DAY 11/09/23   Geronimo Amel, MD    Allergies: Ace inhibitors, Bactrim, and Sulfa antibiotics    Review of Systems  Constitutional:  Positive for fever.  Respiratory:  Positive for cough, sputum production, shortness of breath and wheezing. Negative for hemoptysis.   Cardiovascular:  Negative for chest pain.  Gastrointestinal:  Positive  for vomiting. Negative for abdominal pain.    Updated Vital Signs BP (!) 170/62   Pulse 96   Temp 99.6 F (37.6 C)   Resp 20   Ht 5' 4 (1.626 m)   Wt 90.7 kg   SpO2 91%   BMI 34.33 kg/m   Physical Exam Vitals and nursing note reviewed.  Constitutional:      General: She is not in acute distress.    Appearance: Normal appearance. She is well-developed.  HENT:     Head: Normocephalic and atraumatic.  Eyes:     Conjunctiva/sclera: Conjunctivae normal.  Cardiovascular:     Rate and Rhythm: Normal rate and regular rhythm.     Heart sounds: No murmur heard. Pulmonary:     Effort: Pulmonary effort is normal. No respiratory distress.     Breath sounds: No stridor. Wheezing and rhonchi present.  Abdominal:     Palpations: Abdomen is soft.     Tenderness: There is no abdominal tenderness. There is no guarding or rebound.  Musculoskeletal:        General: No tenderness or deformity.     Cervical back: Neck supple.     Right lower leg: No tenderness. No edema.     Left lower leg: No tenderness. No edema.  Skin:    General: Skin is warm and dry.  Neurological:     General: No focal deficit present.     Mental Status: She is alert.     GCS: GCS eye subscore is 4. GCS verbal subscore is 5. GCS motor subscore is 6.     (all labs ordered are listed, but only abnormal results are displayed) Labs Reviewed  BASIC METABOLIC PANEL WITH GFR - Abnormal; Notable for the following components:      Result Value   Potassium 3.2 (*)    Glucose, Bld 138 (*)    All other components within normal limits  CBC - Abnormal; Notable for the following components:   WBC 17.5 (*)    All other components within normal limits  RESP PANEL BY RT-PCR (RSV, FLU A&B, COVID)  RVPGX2  CULTURE, BLOOD (ROUTINE X 2)  CULTURE, BLOOD (ROUTINE X 2)  LACTIC ACID, PLASMA  HIV ANTIBODY (ROUTINE TESTING W REFLEX)  CBC WITH DIFFERENTIAL/PLATELET  BASIC METABOLIC PANEL WITH GFR  MAGNESIUM     EKG: EKG  Interpretation Date/Time:  Sunday February 28 2024 08:45:30 EDT Ventricular Rate:  92 PR Interval:  146 QRS Duration:  90 QT Interval:  342 QTC Calculation: 423 R Axis:   65  Text Interpretation: Sinus rhythm Consider right atrial enlargement Abnormal R-wave progression, early transition Borderline repolarization abnormality Confirmed by Towana Sharper [phone]) on 02/28/2024 9:03:16 AM  Radiology: [facility] Chest Portable 1 View Result Date: 02/28/2024 EXAM: 1 VIEW XRAY OF THE CHEST 02/28/2024 08:55:07 AM COMPARISON: 09/09/2022 CLINICAL HISTORY: Cough, shortness of breath. Per Triage: Patient arrives ambulatory by POV c/o shortness of breath, fevers and chills since Thursday. Went to hospital in  [place] Friday and was prescribed prednisone  and cough medicine. Patient has COPD and on 2L [place] at night. Reports brownish; yellow mucus with cough. FINDINGS: LUNGS AND PLEURA: Patchy bilateral airspace opacities within the mid and upper lung zones. No pleural fluid, interstitial edema or pneumothorax. HEART AND MEDIASTINUM: Aortic calcification. BONES AND SOFT TISSUES: Multilevel degenerative changes of thoracic spine. No acute osseous abnormality. IMPRESSION: 1. Patchy bilateral airspace opacities within the mid and upper lung zones, possibly related to the patient's reported cough and shortness of breath. Electronically signed by: Waddell Calk MD 02/28/2024 09:08 AM EDT RP Workstation: [id number]     Procedures   Medications Ordered in the ED  cefTRIAXone  (ROCEPHIN ) 2 g in sodium chloride  0.9 % 100 mL IVPB (has no administration in time range)  azithromycin  (ZITHROMAX ) tablet 500 mg (has no administration in time range)  enoxaparin  (LOVENOX ) injection 40 mg (has no administration in time range)  acetaminophen  (TYLENOL ) tablet 650 mg (650 mg Oral Given 02/28/24 1436)    Or  acetaminophen  (TYLENOL ) suppository 650 mg ( Rectal See Alternative 02/28/24 1436)  oxyCODONE  (Oxy IR/ROXICODONE ) immediate release  tablet 5 mg (has no administration in time range)  fentaNYL  (SUBLIMAZE ) injection 25 mcg (has no administration in time range)  0.9 %  sodium chloride  infusion ( Intravenous New Bag/Given 02/28/24 1107)  traZODone  (DESYREL ) tablet 50 mg (has no administration in time range)  bisacodyl  (DULCOLAX) EC tablet 5 mg (has no administration in time range)  ondansetron  (ZOFRAN ) tablet 4 mg (has no administration in time range)    Or  ondansetron  (ZOFRAN ) injection 4 mg (has no administration in time range)  pantoprazole  (PROTONIX ) EC tablet 40 mg (40 mg Oral Given 02/28/24 1723)  ipratropium-albuterol  (DUONEB) 0.5-2.5 (3) MG/3ML nebulizer solution 3 mL (3 mLs Nebulization Given 02/28/24 1441)  ipratropium-albuterol  (DUONEB) 0.5-2.5 (3) MG/3ML nebulizer solution 3 mL (has no administration in time range)  methylPREDNISolone  sodium succinate (SOLU-MEDROL ) 40 mg/mL injection 40 mg (40 mg Intravenous Given 02/28/24 1722)  guaiFENesin  (MUCINEX ) 12 hr tablet 1,200 mg (1,200 mg Oral Given 02/28/24 1117)  amLODipine  (NORVASC ) tablet 5 mg (5 mg Oral Given 02/28/24 1117)  ALPRAZolam  (XANAX ) tablet 0.5 mg (has no administration in time range)  atorvastatin  (LIPITOR ) tablet 80 mg (80 mg Oral Given 02/28/24 1722)  budesonide -glycopyrrolate -formoterol  (BREZTRI ) 160-9-4.8 MCG/ACT inhaler 2 puff (2 puffs Inhalation Given 02/28/24 1156)  loratadine  (CLARITIN ) tablet 10 mg (has no administration in time range)  FLUoxetine  (PROZAC ) capsule 80 mg (80 mg Oral Given 02/28/24 1117)  nicotine  (NICODERM CQ  - dosed in mg/24 hours) patch 21 mg (21 mg Transdermal Patch Applied 02/28/24 1128)  aspirin  tablet 325 mg (has no administration in time range)  ezetimibe  (ZETIA ) tablet 10 mg (10 mg Oral Given 02/28/24 1723)  fluticasone  (FLONASE ) 50 MCG/ACT nasal spray 2 spray (has no administration in time range)  roflumilast  (DALIRESP ) tablet 500 mcg (has no administration in time range)  hydrochlorothiazide  (HYDRODIURIL ) tablet 12.5 mg (12.5 mg  Oral Given 02/28/24 1722)    And  irbesartan  (AVAPRO ) tablet 300 mg (300 mg Oral Given 02/28/24 1723)  ipratropium-albuterol  (DUONEB) 0.5-2.5 (3) MG/3ML nebulizer solution 3 mL (3 mLs Nebulization Given 02/28/24 0929)  cefTRIAXone  (ROCEPHIN ) 1 g in sodium chloride  0.9 % 100 mL IVPB (0 g Intravenous Stopped 02/28/24 1003)  azithromycin  (ZITHROMAX ) 500 mg in sodium chloride  0.9 % 250 mL IVPB (0 mg Intravenous Stopped 02/28/24 1324)  magnesium  sulfate IVPB 2 g 50 mL (0 g Intravenous Stopped 02/28/24 1108)  potassium chloride  SA (KLOR-CON  M)  CR tablet 40 mEq (40 mEq Oral Given 02/28/24 1128)    Clinical Course as of 02/28/24 1728  Sun Feb 28, 2024  0913 Chest x-ray interpreted by me as probable infiltrate lower and midlung. [MB]  1029 Patient needed to be put on oxygen  after she returned from walking to the bathroom.  She still looks uncomfortable so feel she would be better served by being admitted and she is agreeable. [MB]  1033 Discussed with [facility] hospitalist Dr. Vicci who will evaluate patient for admission. [MB]    Clinical Course User Index [MB] Towana Ozell BROCKS, MD                                 Medical Decision Making Amount and/or Complexity of Data Reviewed Labs: ordered. Radiology: ordered.  Risk Prescription drug management. Decision regarding hospitalization.   This patient complains of fever cough shortness of breath; this involves an extensive number of treatment Options and is a complaint that carries with it a high risk of complications and morbidity. The differential includes COPD, pneumonia, pneumothorax, PE, vascular, COVID  I ordered, reviewed and interpreted labs, which included CBC with elevated white count, chemistries with mildly low potassium elevated glucose, blood culture sent, lactate normal, COVID and flu negative I ordered medication IV magnesium  and antibiotics, breathing treatments and reviewed PMP when indicated. I ordered imaging studies which  included chest x-ray and I independently    visualized and interpreted imaging which showed multifocal pneumonia Additional history obtained from patient's family member Previous records obtained and reviewed in epic including outpatient pulmonology notes I consulted [facility] hospitalist Dr. Vicci and discussed lab and imaging findings and discussed disposition.  Cardiac monitoring reviewed, sinus rhythm Social determinants considered, no significant barriers Critical Interventions: None  After the interventions stated above, I reevaluated the patient and found patient to have a new oxygen  requirement and still feeling short of breath Admission and further testing considered, she would benefit from mission to the hospital for IV antibiotics and further management.  She is in agreement with plan for admission.      Final diagnoses:  Chronic respiratory failure with hypoxia [facility])  Multifocal pneumonia    ED Discharge Orders     None          Towana Ozell BROCKS, MD 02/28/24 1731

## 2024-02-28 NOTE — Hospital Course (Signed)
 63 year old female with interstitial lung disease, COPD, active smoker, OSA, GERD, hypertension, chronic respiratory failure on nightly home O2, DOE, chronic rhinitis, chronic pain, anxiety and depression, class II obesity presented to the ED today for second opinion as she has continued to have increasing shortness of breath fever chills for the last 4 days.  She had been seen at an outside hospital 2 days ago and was prescribed prednisone  and cough syrup.  She continue to have productive sputum that was brown and yellowish.  She was noted to have desaturated when ambulating down to 87% on room air.  She was placed on supplemental oxygen  with some improvement in symptoms.  Her chest x-ray showed findings of multifocal pneumonia and emphysema.  Her acute viral testing has been negative for influenza RSV and SARS 2 coronavirus.  She was given treatments in the ED but her symptoms did not improve adequately and with a new oxygen  requirement and admission was requested for further management.

## 2024-02-28 NOTE — H&P (Addendum)
 History and Physical  [facility]  Jenna Hancock FMW:[id number] DOB: 12-03-1960 DOA: 02/28/2024  PCP: Pcp, No  Patient coming from: home by POV Level of care: Med-Surg  I have personally briefly reviewed patient's old medical records in [facility]  Chief Complaint: SOB  HPI: Jenna Hancock is a 63 year old female with interstitial lung disease, COPD, active smoker, OSA, GERD, hypertension, chronic respiratory failure on nightly home O2, DOE, chronic rhinitis, chronic pain, anxiety and depression, class II obesity presented to the ED today for second opinion as she has continued to have increasing shortness of breath fever chills for the last 4 days.  She had been seen at an outside hospital 2 days ago and was prescribed prednisone  and cough syrup.  She continue to have productive sputum that was brown and yellowish.  She was noted to have desaturated when ambulating down to 87% on room air.  She was placed on supplemental oxygen  with some improvement in symptoms.  Her chest x-ray showed findings of multifocal pneumonia and emphysema.  Her acute viral testing has been negative for influenza RSV and SARS 2 coronavirus.  She was given treatments in the ED but her symptoms did not improve adequately and with a new oxygen  requirement and admission was requested for further management.    Past Medical History:  Diagnosis Date   Anxiety    Arthritis    Depression    Essential hypertension, benign 12/08/2016   GERD (gastroesophageal reflux disease)    Heart murmur    AS A CHILD    High cholesterol 12/08/2016   Hyperlipidemia    Hypertension    PONV (postoperative nausea and vomiting)    Recurrent upper respiratory infection (URI)    Sleep apnea    DOES NOT USE CPAP    Stroke (HCC)    HX OF MINI STROKE 2016    TIA (transient ischemic attack) 2017    Past Surgical History:  Procedure Laterality Date   APPENDECTOMY     BIOPSY  12/10/2021   Procedure: BIOPSY;  Surgeon:  Eartha Angelia Sieving, MD;  Location: [facility];  Service: Gastroenterology;;   COLONOSCOPY WITH PROPOFOL  N/A 12/10/2021   Procedure: COLONOSCOPY WITH PROPOFOL ;  Surgeon: Eartha Angelia Sieving, MD;  Location: [facility];  Service: Gastroenterology;  Laterality: N/A;  815   JOINT REPLACEMENT     rt knee 11 yrs ago   KNEE ARTHROSCOPY Left    NASAL SINUS SURGERY     POLYPECTOMY  12/10/2021   Procedure: POLYPECTOMY;  Surgeon: Eartha Angelia Sieving, MD;  Location: [facility];  Service: Gastroenterology;;   TOTAL KNEE ARTHROPLASTY Left 02/01/2020   Procedure: TOTAL KNEE ARTHROPLASTY;  Surgeon: Duwayne Purchase, MD;  Location: [facility];  Service: Orthopedics;  Laterality: Left;  general or spinal     reports that she quit smoking about 2 years ago. Her smoking use included cigarettes. She started smoking about 42 years ago. She has a 40.3 pack-year smoking history. She has never used smokeless tobacco. She reports that she does not drink alcohol and does not use drugs.  Allergies  Allergen Reactions   Ace Inhibitors Nausea Only   Bactrim Rash   Sulfa Antibiotics Rash    Family History  Problem Relation Age of Onset   Lung cancer Mother    Diabetes Father    Stroke Father    Heart disease Father    Asthma Son     Prior to Admission medications   Medication  Sig Start Date End Date Taking? Authorizing Provider  ALPRAZolam  (XANAX ) 0.5 MG tablet Take 1 tablet (0.5 mg total) by mouth 2 (two) times daily as needed for anxiety. 12/25/23   Bevely Doffing, FNP  amLODipine  (NORVASC ) 5 MG tablet Take 1 tablet (5 mg total) by mouth daily. 12/25/23   Bevely Doffing, FNP  aspirin  325 MG tablet Take 325 mg by mouth at bedtime.    [provider]  atorvastatin  (LIPITOR ) 80 MG tablet Take 1 tablet (80 mg total) by mouth daily. 10/16/23   Melvenia Manus BRAVO, MD  azelastine  (ASTELIN ) 0.1 % nasal spray Place 1 spray into both nostrils 2 (two) times daily as needed for rhinitis. Use in each  nostril as directed 10/26/23   Jeneal Danita Macintosh, MD  Budeson-Glycopyrrol-Formoterol  (BREZTRI  AEROSPHERE) 160-9-4.8 MCG/ACT AERO Inhale 2 puffs into the lungs in the morning and at bedtime. 04/10/23   Parrett, Madelin RAMAN, NP  cetirizine  (ZYRTEC  ALLERGY ) 10 MG tablet Take 1 tablet (10 mg total) by mouth daily. 10/26/23   Jeneal Danita Macintosh, MD  ezetimibe  (ZETIA ) 10 MG tablet Take 10 mg by mouth every evening. 11/24/19   [provider]  FLUoxetine  (PROZAC ) 40 MG capsule TAKE 2 CAPSULES EVERY DAY 01/27/24   Bevely Doffing, FNP  fluticasone  (FLONASE ) 50 MCG/ACT nasal spray Place 2 sprays into both nostrils daily. 10/26/23   Jeneal Danita Macintosh, MD  folic acid  (FOLVITE ) 800 MCG tablet Take 800 mcg by mouth daily.    [provider]  HYDROcodone -acetaminophen  (NORCO) 10-325 MG tablet Take 1 tablet by mouth in the morning and at bedtime.    [provider]  IBU 800 MG tablet TAKE 1 TABLET EVERY DAY AS NEEDED 03/18/23   [provider]  ipratropium-albuterol  (DUONEB) 0.5-2.5 (3) MG/3ML SOLN Take 3 mLs by nebulization 4 (four) times daily. 12/25/23   Bevely Doffing, FNP  irbesartan -hydrochlorothiazide  (AVALIDE) 300-12.5 MG tablet Take 1 tablet by mouth daily. 12/25/23   Bevely Doffing, FNP  Multiple Vitamins-Minerals (MULTIVITAMIN WITH MINERALS) tablet Take 1 tablet by mouth daily.    [provider]  OVER THE COUNTER MEDICATION Take 1 capsule by mouth daily. Fish Oil    [provider]  pantoprazole  (PROTONIX ) 40 MG tablet Take 1 tablet (40 mg total) by mouth daily. 12/25/23   Bevely Doffing, FNP  roflumilast  (DALIRESP ) 500 MCG TABS tablet TAKE 1 TABLET EVERY DAY 11/09/23   Geronimo Amel, MD    Physical Exam: Vitals:   02/28/24 0843 02/28/24 0845 02/28/24 0930 02/28/24 1000  BP:   (!) 147/61 (!) 143/72  Pulse:   83 92  Resp:   (!) 25 18  Temp:  99.6 F (37.6 C)    SpO2:   92%   Weight: 90.7 kg     Height: 5' 4 (1.626 m)        Constitutional: pt appears ill, uncomfortable and in respiratory distress. Eyes: PERRL, lids and conjunctivae normal ENMT: Mucous membranes are moist. Posterior pharynx clear of any exudate or lesions.Normal dentition.  Neck: normal, supple, no masses, no thyromegaly Respiratory: diffuse rales and expiratory wheezing heard.   Cardiovascular: normal s1, s2 sounds, no murmurs / rubs / gallops. No extremity edema. 2+ pedal pulses. No carotid bruits.  Abdomen: no tenderness, no masses palpated. No hepatosplenomegaly. Bowel sounds positive.  Musculoskeletal: no clubbing / cyanosis. No joint deformity upper and lower extremities. Good ROM, no contractures. Normal muscle tone.  Skin: no rashes, lesions, ulcers. No induration Neurologic: CN 2-12 grossly intact. Sensation intact,  DTR normal. Strength 5/5 in all 4.  Psychiatric: Normal judgment and insight. Alert and oriented x 3. Normal mood.   Labs on Admission: I have personally reviewed following labs and imaging studies  CBC: Recent Labs  Lab 02/28/24 0904  WBC 17.5*  HGB 12.0  HCT 36.9  MCV 87.9  PLT 251   Basic Metabolic Panel: Recent Labs  Lab 02/28/24 0904  NA 136  K 3.2*  CL 98  CO2 23  GLUCOSE 138*  BUN 15  CREATININE 0.79  CALCIUM  9.1   GFR: Estimated Creatinine Clearance: 78.5 mL/min (by C-G formula based on SCr of 0.79 mg/dL). Liver Function Tests: No results for input(s): AST, ALT, ALKPHOS, BILITOT, PROT, ALBUMIN in the last 168 hours. No results for input(s): LIPASE, AMYLASE in the last 168 hours. No results for input(s): AMMONIA in the last 168 hours. Coagulation Profile: No results for input(s): INR, PROTIME in the last 168 hours. Cardiac Enzymes: No results for input(s): CKTOTAL, CKMB, CKMBINDEX, TROPONINI in the last 168 hours. BNP (last 3 results) No results for input(s): PROBNP in the last 8760 hours. HbA1C: No results for input(s): HGBA1C in the last 72  hours. CBG: No results for input(s): GLUCAP in the last 168 hours. Lipid Profile: No results for input(s): CHOL, HDL, LDLCALC, TRIG, CHOLHDL, LDLDIRECT in the last 72 hours. Thyroid  Function Tests: No results for input(s): TSH, T4TOTAL, FREET4, T3FREE, THYROIDAB in the last 72 hours. Anemia Panel: No results for input(s): VITAMINB12, FOLATE, FERRITIN, TIBC, IRON, RETICCTPCT in the last 72 hours. Urine analysis:    Component Value Date/Time   COLORURINE YELLOW 01/26/2020 1106   APPEARANCEUR CLEAR 01/26/2020 1106   LABSPEC 1.028 01/26/2020 1106   PHURINE 5.0 01/26/2020 1106   GLUCOSEU NEGATIVE 01/26/2020 1106   HGBUR NEGATIVE 01/26/2020 1106   BILIRUBINUR NEGATIVE 01/26/2020 1106   KETONESUR 5 (A) 01/26/2020 1106   PROTEINUR NEGATIVE 01/26/2020 1106   NITRITE NEGATIVE 01/26/2020 1106   LEUKOCYTESUR NEGATIVE 01/26/2020 1106    Radiological Exams on Admission: DG Chest Portable 1 View Result Date: 02/28/2024 EXAM: 1 VIEW XRAY OF THE CHEST 02/28/2024 08:55:07 AM COMPARISON: 09/09/2022 CLINICAL HISTORY: Cough, shortness of breath. Per Triage: Patient arrives ambulatory by POV c/o shortness of breath, fevers and chills since Thursday. Went to hospital in [facility] Friday and was prescribed prednisone  and cough medicine. Patient has COPD and on 2L [place] at night. Reports brownish; yellow mucus with cough. FINDINGS: LUNGS AND PLEURA: Patchy bilateral airspace opacities within the mid and upper lung zones. No pleural fluid, interstitial edema or pneumothorax. HEART AND MEDIASTINUM: Aortic calcification. BONES AND SOFT TISSUES: Multilevel degenerative changes of thoracic spine. No acute osseous abnormality. IMPRESSION: 1. Patchy bilateral airspace opacities within the mid and upper lung zones, possibly related to the patient's reported cough and shortness of breath. Electronically signed by: Waddell Calk MD 02/28/2024 09:08 AM EDT RP Workstation: [id number]    EKG:  Independently reviewed. NSR  Assessment/Plan Principal Problem:   COPD with acute exacerbation (HCC) Active Problems:   Obstructive sleep apnea   Essential hypertension, benign   Cigarette smoker   Multifocal pneumonia   Acute respiratory failure with hypoxia (HCC)   ILD (interstitial lung disease) (HCC)   Obesity, Class II, BMI 35-39.9   Chronic respiratory failure (HCC)   DOE (dyspnea on exertion)   Chronic rhinitis   COPD (chronic obstructive pulmonary disease) (HCC)   Chronic pain syndrome   Anxiety and depression   GERD (gastroesophageal reflux disease)   Acute on chronic  respiratory failure with hypoxia -Secondary to multifocal pneumonia and acute COPD exacerbation -Continue treatments for pneumonia and COPD exacerbation as ordered -Continuous supplemental oxygen  ordered 2 L/min -Continue bronchodilator therapy as ordered  Multifocal pneumonia -Agree with IV ceftriaxone  azithromycin  -Supplemental oxygen  and supportive measures ordered -Mucolytic's, cough suppressants and bronchodilators ordered -Repeat chest x-ray in 1 to 2 days -Viral respiratory panel negative, blood cultures not obtained prior to IV antibiotics given in ED  Acute COPD exacerbation -Secondary to pneumonia -Treating with IV steroids, antibiotics, scheduled bronchodilators and mucolytic's -Follow clinically, wean supplemental oxygen  as able  OSA -Will offer nightly CPAP while in hospital  GERD -Pantoprazole  ordered for GI protection  Chronic rhinitis -Resumed antihistamine therapy  Chronic pain -Pain management ordered  Anxiety and depression -Resume home medications  Primary hypertension -Resume home medication for management  Tobacco abuse -Counseled on cessation -Ordered nicotine  patch 21 mg daily  DVT prophylaxis: Enoxaparin  Code Status: Full Family Communication:   Disposition Plan: Anticipate home Consults called: RT Admission status: Inpatient Time spent: 62  minutes Level of care: Med-Surg Afton Louder MD [facility] How to contact the [facility] Attending or Consulting provider 7A - 7P or covering provider during after hours 7P -7A, for this patient?  Check the care team in [facility] and look for a) attending/consulting [facility] provider listed and b) the [facility] team listed Log into [email or web address] and use [facility]'s universal password to access. If you do not have the password, please contact the hospital operator. Locate the [facility] provider you are looking for under [facility] and page to a number that you can be directly reached. If you still have difficulty reaching the provider, please page the [facility] (Director on Call) for the Hospitalists listed on amion for assistance.   If 7PM-7AM, please contact night-coverage [email or web address] Password [email or web address]  02/28/2024, 10:53 AM

## 2024-02-28 NOTE — ED Notes (Signed)
 Patient ambulated to restroom and oxygen  saturation dropped to 87%. Placed on 2L [place]

## 2024-02-28 NOTE — Plan of Care (Signed)
   Problem: Education: Goal: Knowledge of General Education information will improve Description Including pain rating scale, medication(s)/side effects and non-pharmacologic comfort measures Outcome: Progressing   Problem: Health Behavior/Discharge Planning: Goal: Ability to manage health-related needs will improve Outcome: Progressing

## 2024-02-28 NOTE — ED Triage Notes (Signed)
 Patient arrives ambulatory by POV c/o shortness of breath, fevers and chills since Thursday. Went to hospital in [facility] Friday and was prescribed prednisone  and cough medicine. Patient has COPD and on 2L [place] at night.  Reports brownish yellow mucus with cough.

## 2024-02-29 DIAGNOSIS — G4733 Obstructive sleep apnea (adult) (pediatric): Secondary | ICD-10-CM | POA: Diagnosis not present

## 2024-02-29 DIAGNOSIS — F17210 Nicotine dependence, cigarettes, uncomplicated: Secondary | ICD-10-CM

## 2024-02-29 DIAGNOSIS — J449 Chronic obstructive pulmonary disease, unspecified: Secondary | ICD-10-CM

## 2024-02-29 DIAGNOSIS — J441 Chronic obstructive pulmonary disease with (acute) exacerbation: Secondary | ICD-10-CM | POA: Diagnosis not present

## 2024-02-29 DIAGNOSIS — J9601 Acute respiratory failure with hypoxia: Secondary | ICD-10-CM | POA: Diagnosis not present

## 2024-02-29 LAB — CBC WITH DIFFERENTIAL/PLATELET
Abs Immature Granulocytes: 0.14 K/uL — ABNORMAL HIGH (ref 0.00–0.07)
Basophils Absolute: 0 K/uL (ref 0.0–0.1)
Basophils Relative: 0 %
Eosinophils Absolute: 0 K/uL (ref 0.0–0.5)
Eosinophils Relative: 0 %
HCT: 36.3 % (ref 36.0–46.0)
Hemoglobin: 11.7 g/dL — ABNORMAL LOW (ref 12.0–15.0)
Immature Granulocytes: 1 %
Lymphocytes Relative: 7 %
Lymphs Abs: 1.2 K/uL (ref 0.7–4.0)
MCH: 28.3 pg (ref 26.0–34.0)
MCHC: 32.2 g/dL (ref 30.0–36.0)
MCV: 87.7 fL (ref 80.0–100.0)
Monocytes Absolute: 0.9 K/uL (ref 0.1–1.0)
Monocytes Relative: 5 %
Neutro Abs: 15.4 K/uL — ABNORMAL HIGH (ref 1.7–7.7)
Neutrophils Relative %: 87 %
Platelets: 248 K/uL (ref 150–400)
RBC: 4.14 MIL/uL (ref 3.87–5.11)
RDW: 13.6 % (ref 11.5–15.5)
WBC: 17.6 K/uL — ABNORMAL HIGH (ref 4.0–10.5)
nRBC: 0 % (ref 0.0–0.2)

## 2024-02-29 LAB — BASIC METABOLIC PANEL WITH GFR
Anion gap: 14 (ref 5–15)
BUN: 16 mg/dL (ref 8–23)
CO2: 23 mmol/L (ref 22–32)
Calcium: 9.2 mg/dL (ref 8.9–10.3)
Chloride: 103 mmol/L (ref 98–111)
Creatinine, Ser: 0.78 mg/dL (ref 0.44–1.00)
GFR, Estimated: >60 mL/min (ref >60)
Glucose, Bld: 153 mg/dL — ABNORMAL HIGH (ref 70–99)
Potassium: 3.8 mmol/L (ref 3.5–5.1)
Sodium: 140 mmol/L (ref 135–145)

## 2024-02-29 LAB — MAGNESIUM: Magnesium: 2.4 mg/dL (ref 1.7–2.4)

## 2024-02-29 NOTE — Progress Notes (Signed)
 Patient has refused CPAP.  Patient stated to me that she no longer needs it after losing weight, but patient is aware that we have one if she needs it.

## 2024-02-29 NOTE — Progress Notes (Signed)
   02/29/24 1541  TOC Brief Assessment  Insurance and Status Reviewed  Patient has primary care physician No (PCP list added)  Home environment has been reviewed Home with spouse  Prior level of function: Independent  Prior/Current Home Services No current home services  Social Drivers of Health Review SDOH reviewed no interventions necessary  Readmission risk has been reviewed Yes  Transition of care needs no transition of care needs at this time   PCP list added to AVS.  Transition of Care Department (TOC) has reviewed patient and no TOC needs have been identified at this time. We will continue to monitor patient advancement through interdisciplinary progression rounds. If new patient transition needs arise, please place a TOC consult.

## 2024-02-29 NOTE — Plan of Care (Signed)
  Problem: Education: Goal: Knowledge of General Education information will improve Description: Including pain rating scale, medication(s)/side effects and non-pharmacologic comfort measures Outcome: Progressing   Problem: Health Behavior/Discharge Planning: Goal: Ability to manage health-related needs will improve Outcome: Progressing   Problem: Clinical Measurements: Goal: Ability to maintain clinical measurements within normal limits will improve Outcome: Progressing Goal: Will remain free from infection Outcome: Progressing Goal: Diagnostic test results will improve Outcome: Progressing Goal: Respiratory complications will improve Outcome: Progressing Goal: Cardiovascular complication will be avoided Outcome: Progressing   Problem: Activity: Goal: Risk for activity intolerance will decrease Outcome: Progressing   Problem: Nutrition: Goal: Adequate nutrition will be maintained Outcome: Progressing   Problem: Coping: Goal: Level of anxiety will decrease Outcome: Progressing   Problem: Elimination: Goal: Will not experience complications related to bowel motility Outcome: Progressing   Problem: Skin Integrity: Goal: Risk for impaired skin integrity will decrease Outcome: Progressing   Problem: Safety: Goal: Ability to remain free from injury will improve Outcome: Progressing

## 2024-02-29 NOTE — Discharge Instructions (Signed)

## 2024-02-29 NOTE — Progress Notes (Addendum)
 SATURATION QUALIFICATIONS: (This note is used to comply with regulatory documentation for home oxygen )  Patient Saturations on Room Air at Rest = 83%  Patient Saturations on Room Air while Ambulating = 73%  Patient Saturations on 3 Liters of oxygen  while Ambulating = 92%  Please briefly explain why patient needs home oxygen :Pt needs oxygen .

## 2024-02-29 NOTE — Plan of Care (Signed)

## 2024-02-29 NOTE — Progress Notes (Signed)
 PROGRESS NOTE   Jenna Hancock  FMW:[id number] DOB: [birth year] DOA: 02/28/2024 PCP: Pcp, No   Chief Complaint  Patient presents with   Shortness of Breath   Level of care: Med-Surg  Brief Admission History:  63 year old female with interstitial lung disease, COPD, active smoker, OSA, GERD, hypertension, chronic respiratory failure on nightly home O2, DOE, chronic rhinitis, chronic pain, anxiety and depression, class II obesity presented to the ED today for second opinion as she has continued to have increasing shortness of breath fever chills for the last 4 days.  She had been seen at an outside hospital 2 days ago and was prescribed prednisone  and cough syrup.  She continue to have productive sputum that was brown and yellowish.  She was noted to have desaturated when ambulating down to 87% on room air.  She was placed on supplemental oxygen  with some improvement in symptoms.  Her chest x-ray showed findings of multifocal pneumonia and emphysema.  Her acute viral testing has been negative for influenza RSV and SARS 2 coronavirus.  She was given treatments in the ED but her symptoms did not improve adequately and with a new oxygen  requirement and admission was requested for further management.   Assessment and Plan:  Acute on chronic respiratory failure with hypoxia -Secondary to multifocal pneumonia and acute COPD exacerbation -Continue treatments for pneumonia and COPD exacerbation as ordered -Continuous supplemental oxygen  ordered 2 L/min -Continue bronchodilator therapy as ordered   Multifocal pneumonia -Agree with IV ceftriaxone  azithromycin  -Supplemental oxygen  and supportive measures ordered -Mucolytic's, cough suppressants and bronchodilators ordered -Repeat chest x-ray tomorrow morning -Viral respiratory panel negative, blood cultures not obtained prior to IV antibiotics given in ED   Acute COPD exacerbation -Secondary to pneumonia -Treating with IV steroids, antibiotics,  scheduled bronchodilators and mucolytic's -Follow clinically, wean supplemental oxygen  as able   OSA -offer nightly CPAP while in hospital   GERD -Pantoprazole  ordered for GI protection   Chronic rhinitis -Resumed antihistamine therapy   Chronic pain -Pain management ordered   Anxiety and depression -Resume home medications   Primary hypertension -Resume home medication for management   Tobacco abuse -Counseled on cessation -Ordered nicotine  patch 21 mg daily  DVT prophylaxis: enoxaparin   Code Status: Full  Family Communication:  Disposition: anticipate home with HH   Consultants:   Procedures:   Antimicrobials:  CTX AZITH    Subjective: Pt remains very SOB  Objective: Vitals:   02/29/24 1106 02/29/24 1444 02/29/24 1500 02/29/24 1536  BP:  (!) 150/62 (!) 152/60   Pulse:  89 87   Resp:  20    Temp:  98.5 F (36.9 C) 99.1 F (37.3 C)   TempSrc:  Oral Oral   SpO2: 94% 93% 92% 96%  Weight:      Height:        Intake/Output Summary (Last 24 hours) at 02/29/2024 1611 Last data filed at 02/29/2024 1215 Gross per 24 hour  Intake 460.87 ml  Output --  Net 460.87 ml   Filed Weights   02/28/24 0843  Weight: 90.7 kg   Examination:  General exam: Appears calm and comfortable  Respiratory system: diffuse rales and rhonchi, wheezing heard bilateral, moderate increased work of breathing. Cardiovascular system: normal S1 & S2 heard. No JVD, murmurs, rubs, gallops or clicks. No pedal edema. Gastrointestinal system: Abdomen is nondistended, soft and nontender. No organomegaly or masses felt. Normal bowel sounds heard. Central nervous system: Alert and oriented. No focal neurological deficits. Extremities: Symmetric 5 x 5 power.  Skin: No rashes, lesions or ulcers. Psychiatry: Judgement and insight appear normal. Mood & affect appropriate.   Data Reviewed: I have personally reviewed following labs and imaging studies  CBC: Recent Labs  Lab 02/28/24 0904  02/29/24 0351  WBC 17.5* 17.6*  NEUTROABS  --  15.4*  HGB 12.0 11.7*  HCT 36.9 36.3  MCV 87.9 87.7  PLT 251 248    Basic Metabolic Panel: Recent Labs  Lab 02/28/24 0904 02/29/24 0351  NA 136 140  K 3.2* 3.8  CL 98 103  CO2 23 23  GLUCOSE 138* 153*  BUN 15 16  CREATININE 0.79 0.78  CALCIUM  9.1 9.2  MG  --  2.4    CBG: No results for input(s): GLUCAP in the last 168 hours.  Recent Results (from the past 240 hours)  Resp panel by RT-PCR (RSV, Flu A&B, Covid) Anterior Nasal Swab     Status: None   Collection Time: 02/28/24  8:45 AM   Specimen: Anterior Nasal Swab  Result Value Ref Range Status   SARS Coronavirus 2 by RT PCR NEGATIVE NEGATIVE Final    Comment: (NOTE) SARS-CoV-2 target nucleic acids are NOT DETECTED.  The SARS-CoV-2 RNA is generally detectable in upper respiratory specimens during the acute phase of infection. The lowest concentration of SARS-CoV-2 viral copies this assay can detect is 138 copies/mL. A negative result does not preclude SARS-Cov-2 infection and should not be used as the sole basis for treatment or other patient management decisions. A negative result may occur with  improper specimen collection/handling, submission of specimen other than nasopharyngeal swab, presence of viral mutation(s) within the areas targeted by this assay, and inadequate number of viral copies(<138 copies/mL). A negative result must be combined with clinical observations, patient history, and epidemiological information. The expected result is Negative.  Fact Sheet for Patients:  BloggerCourse.com  Fact Sheet for Healthcare Providers:  SeriousBroker.it  This test is no t yet approved or cleared by the [place]  FDA and  has been authorized for detection and/or diagnosis of SARS-CoV-2 by FDA under an Emergency Use Authorization (EUA). This EUA will remain  in effect (meaning this test can be used) for the  duration of the COVID-19 declaration under Section 564(b)(1) of the Act, 21 U.S.C.section 360bbb-3(b)(1), unless the authorization is terminated  or revoked sooner.       Influenza A by PCR NEGATIVE NEGATIVE Final   Influenza B by PCR NEGATIVE NEGATIVE Final    Comment: (NOTE) The Xpert Xpress SARS-CoV-2/FLU/RSV plus assay is intended as an aid in the diagnosis of influenza from Nasopharyngeal swab specimens and should not be used as a sole basis for treatment. Nasal washings and aspirates are unacceptable for Xpert Xpress SARS-CoV-2/FLU/RSV testing.  Fact Sheet for Patients: BloggerCourse.com  Fact Sheet for Healthcare Providers: SeriousBroker.it  This test is not yet approved or cleared by the [place]  FDA and has been authorized for detection and/or diagnosis of SARS-CoV-2 by FDA under an Emergency Use Authorization (EUA). This EUA will remain in effect (meaning this test can be used) for the duration of the COVID-19 declaration under Section 564(b)(1) of the Act, 21 U.S.C. section 360bbb-3(b)(1), unless the authorization is terminated or revoked.     Resp Syncytial Virus by PCR NEGATIVE NEGATIVE Final    Comment: (NOTE) Fact Sheet for Patients: BloggerCourse.com  Fact Sheet for Healthcare Providers: SeriousBroker.it  This test is not yet approved or cleared by the [place]  FDA and has been authorized for detection and/or diagnosis of  SARS-CoV-2 by FDA under an Emergency Use Authorization (EUA). This EUA will remain in effect (meaning this test can be used) for the duration of the COVID-19 declaration under Section 564(b)(1) of the Act, 21 U.S.C. section 360bbb-3(b)(1), unless the authorization is terminated or revoked.  Performed at [facility], [address] [place], [place] [address]   Culture, blood (routine x 2)     Status: None (Preliminary result)    Collection Time: 02/28/24  9:20 AM   Specimen: BLOOD  Result Value Ref Range Status   Specimen Description BLOOD BLOOD RIGHT FOREARM  Final   Special Requests   Final    BOTTLES DRAWN AEROBIC AND ANAEROBIC Blood Culture adequate volume   Culture   Final    NO GROWTH < 24 HOURS Performed at [facility], [address] [place], [place] [address]    Report Status PENDING  Incomplete  Culture, blood (routine x 2)     Status: None (Preliminary result)   Collection Time: 02/28/24  9:26 AM   Specimen: BLOOD  Result Value Ref Range Status   Specimen Description BLOOD RIGHT ANTECUBITAL  Final   Special Requests   Final    BOTTLES DRAWN AEROBIC AND ANAEROBIC Blood Culture adequate volume   Culture   Final    NO GROWTH < 24 HOURS Performed at [facility], [address], [place], [place] [address]    Report Status PENDING  Incomplete     Radiology Studies: DG Chest Portable 1 View Result Date: 02/28/2024 EXAM: 1 VIEW XRAY OF THE CHEST 02/28/2024 08:55:07 AM COMPARISON: 09/09/2022 CLINICAL HISTORY: Cough, shortness of breath. Per Triage: Patient arrives ambulatory by POV c/o shortness of breath, fevers and chills since Thursday. Went to hospital in [facility] Friday and was prescribed prednisone  and cough medicine. Patient has COPD and on 2L [place] at night. Reports brownish; yellow mucus with cough. FINDINGS: LUNGS AND PLEURA: Patchy bilateral airspace opacities within the mid and upper lung zones. No pleural fluid, interstitial edema or pneumothorax. HEART AND MEDIASTINUM: Aortic calcification. BONES AND SOFT TISSUES: Multilevel degenerative changes of thoracic spine. No acute osseous abnormality. IMPRESSION: 1. Patchy bilateral airspace opacities within the mid and upper lung zones, possibly related to the patient's reported cough and shortness of breath. Electronically signed by: Taylor Stroud MD 02/28/2024 09:08 AM EDT RP Workstation: [id number]    Scheduled Meds:  amLODipine   5 mg Oral Daily    aspirin   325 mg Oral QHS   atorvastatin   80 mg Oral QPM   azithromycin   500 mg Oral Daily   budesonide -glycopyrrolate -formoterol   2 puff Inhalation BID   enoxaparin  (LOVENOX ) injection  40 mg Subcutaneous Q24H   ezetimibe   10 mg Oral QPM   FLUoxetine   80 mg Oral Daily   guaiFENesin   1,200 mg Oral BID   hydrochlorothiazide   12.5 mg Oral Daily   And   irbesartan   300 mg Oral Daily   ipratropium-albuterol   3 mL Nebulization QID   loratadine   10 mg Oral QHS   methylPREDNISolone  (SOLU-MEDROL ) injection  40 mg Intravenous Q12H   nicotine   21 mg Transdermal Daily   pantoprazole   40 mg Oral QPM   roflumilast   500 mcg Oral Daily   Continuous Infusions:  cefTRIAXone  (ROCEPHIN )  IV 2 g (02/29/24 1003)     LOS: 1 day   Time spent: 56 mins  Shanon Becvar Vicci, MD How to contact the [facility] Attending or Consulting provider 7A - 7P or covering provider during after hours 7P -7A, for this  patient?  Check the care team in [facility] and look for a) attending/consulting TRH provider listed and b) the TRH team listed Log into [email or web address] to find provider on call.  Locate the TRH provider you are looking for under [facility] and page to a number that you can be directly reached. If you still have difficulty reaching the provider, please page the [facility] (Director on Call) for the Hospitalists listed on amion for assistance.  02/29/2024, 4:11 PM

## 2024-03-01 ENCOUNTER — Inpatient Hospital Stay (HOSPITAL_COMMUNITY)

## 2024-03-01 DIAGNOSIS — G4733 Obstructive sleep apnea (adult) (pediatric): Secondary | ICD-10-CM | POA: Diagnosis not present

## 2024-03-01 DIAGNOSIS — F17210 Nicotine dependence, cigarettes, uncomplicated: Secondary | ICD-10-CM | POA: Diagnosis not present

## 2024-03-01 DIAGNOSIS — J9601 Acute respiratory failure with hypoxia: Secondary | ICD-10-CM | POA: Diagnosis not present

## 2024-03-01 DIAGNOSIS — J441 Chronic obstructive pulmonary disease with (acute) exacerbation: Secondary | ICD-10-CM | POA: Diagnosis not present

## 2024-03-01 LAB — CBC WITH DIFFERENTIAL/PLATELET
Abs Immature Granulocytes: 0.21 K/uL — ABNORMAL HIGH (ref 0.00–0.07)
Basophils Absolute: 0.1 K/uL (ref 0.0–0.1)
Basophils Relative: 0 %
Eosinophils Absolute: 0 K/uL (ref 0.0–0.5)
Eosinophils Relative: 0 %
HCT: 35.5 % — ABNORMAL LOW (ref 36.0–46.0)
Hemoglobin: 11.6 g/dL — ABNORMAL LOW (ref 12.0–15.0)
Immature Granulocytes: 1 %
Lymphocytes Relative: 7 %
Lymphs Abs: 1.2 K/uL (ref 0.7–4.0)
MCH: 28.9 pg (ref 26.0–34.0)
MCHC: 32.7 g/dL (ref 30.0–36.0)
MCV: 88.5 fL (ref 80.0–100.0)
Monocytes Absolute: 0.9 K/uL (ref 0.1–1.0)
Monocytes Relative: 5 %
Neutro Abs: 15.2 K/uL — ABNORMAL HIGH (ref 1.7–7.7)
Neutrophils Relative %: 87 %
Platelets: 272 K/uL (ref 150–400)
RBC: 4.01 MIL/uL (ref 3.87–5.11)
RDW: 13.4 % (ref 11.5–15.5)
WBC: 17.5 K/uL — ABNORMAL HIGH (ref 4.0–10.5)
nRBC: 0 % (ref 0.0–0.2)

## 2024-03-01 LAB — BASIC METABOLIC PANEL WITH GFR
Anion gap: 9 (ref 5–15)
BUN: 16 mg/dL (ref 8–23)
CO2: 25 mmol/L (ref 22–32)
Calcium: 9.1 mg/dL (ref 8.9–10.3)
Chloride: 106 mmol/L (ref 98–111)
Creatinine, Ser: 0.63 mg/dL (ref 0.44–1.00)
GFR, Estimated: >60 mL/min (ref >60)
Glucose, Bld: 147 mg/dL — ABNORMAL HIGH (ref 70–99)
Potassium: 4 mmol/L (ref 3.5–5.1)
Sodium: 140 mmol/L (ref 135–145)

## 2024-03-01 LAB — MAGNESIUM: Magnesium: 2.2 mg/dL (ref 1.7–2.4)

## 2024-03-01 NOTE — Plan of Care (Signed)

## 2024-03-01 NOTE — Progress Notes (Signed)
 Mobility Specialist Progress Note:    03/01/24 1230  Mobility  Activity Ambulated with assistance  Level of Assistance Independent  Assistive Device None  Distance Ambulated (ft) 140 ft  Range of Motion/Exercises Active;All extremities  Activity Response Tolerated well  Mobility Referral Yes  Mobility visit 1 Mobility  Mobility Specialist Start Time (ACUTE ONLY) 1230  Mobility Specialist Stop Time (ACUTE ONLY) 1250  Mobility Specialist Time Calculation (min) (ACUTE ONLY) 20 min   Pt received in bed, agreeable to mobility. Independently able to stand and ambulate with no AD. Tolerated well, SpO2 93% on 2L at rest. SpO2 dropped to 86% on 2L during ambulation, required 3L to bring O2 back to 93%. Left pt sitting EOB, all needs met.  Jenna Hancock Mobility Specialist Please contact via Special educational needs teacher or  Rehab office at [phone]

## 2024-03-01 NOTE — Progress Notes (Signed)
 PROGRESS NOTE   Jenna Hancock  FMW:[id number] DOB: 19-Feb-1961 DOA: 02/28/2024 PCP: Pcp, No   Chief Complaint  Patient presents with   Shortness of Breath   Level of care: Med-Surg  Brief Admission History:  64 year old female with interstitial lung disease, COPD, active smoker, OSA, GERD, hypertension, chronic respiratory failure on nightly home O2, DOE, chronic rhinitis, chronic pain, anxiety and depression, class II obesity presented to the ED today for second opinion as she has continued to have increasing shortness of breath fever chills for the last 4 days.  She had been seen at an outside hospital 2 days ago and was prescribed prednisone  and cough syrup.  She continue to have productive sputum that was brown and yellowish.  She was noted to have desaturated when ambulating down to 87% on room air.  She was placed on supplemental oxygen  with some improvement in symptoms.  Her chest x-ray showed findings of multifocal pneumonia and emphysema.  Her acute viral testing has been negative for influenza RSV and SARS 2 coronavirus.  She was given treatments in the ED but her symptoms did not improve adequately and with a new oxygen  requirement and admission was requested for further management.   Assessment and Plan:  Acute on chronic respiratory failure with hypoxia -Secondary to multifocal pneumonia and acute COPD exacerbation -Continue treatments for pneumonia and COPD exacerbation as ordered -Continuous supplemental oxygen  ordered 2 L/min -Continue bronchodilator therapy as ordered   Multifocal pneumonia -Agree with IV ceftriaxone  azithromycin  -Supplemental oxygen  and supportive measures ordered -Mucolytic's, cough suppressants and bronchodilators ordered -Repeat chest x-ray showing persistent infiltrates -Viral respiratory panel negative, blood cultures not obtained prior to IV antibiotics given in ED   Acute COPD exacerbation -Secondary to pneumonia -Treating with IV steroids,  antibiotics, scheduled bronchodilators and mucolytics -Follow clinically, wean supplemental oxygen  as able   OSA -offer nightly CPAP while in hospital   GERD -Pantoprazole  ordered for GI protection   Chronic rhinitis -Resumed antihistamine therapy   Chronic pain -Pain management ordered   Anxiety and depression -Resume home medications   Primary hypertension -Resume home medication for management   Tobacco abuse -Counseled on cessation -Ordered nicotine  patch 21 mg daily  DVT prophylaxis: enoxaparin   Code Status: Full  Family Communication:  Disposition: anticipate home with [facility] tomorrow if improved    Consultants:   Procedures:   Antimicrobials:  CTX AZITH    Subjective: Pt says she still has a terrible cough, SOB, no chest pain or palpitations  Objective: Vitals:   02/29/24 2222 03/01/24 0321 03/01/24 0925 03/01/24 1014  BP:  (!) 135/48 126/64   Pulse:  75 85   Resp:  20 16   Temp: 98.1 F (36.7 C) 98.8 F (37.1 C) 98.3 F (36.8 C)   TempSrc: Oral Axillary Oral   SpO2:  91% 94% 94%  Weight:      Height:        Intake/Output Summary (Last 24 hours) at 03/01/2024 1036 Last data filed at 02/29/2024 1215 Gross per 24 hour  Intake 240 ml  Output --  Net 240 ml   Filed Weights   02/28/24 0843  Weight: 90.7 kg   Examination:  General exam: Appears calm and comfortable  Respiratory system: diffuse rhonchi heard; less wheezing heard bilateral, moderate increased work of breathing. Cardiovascular system: normal S1 & S2 heard. No JVD, murmurs, rubs, gallops or clicks. No pedal edema. Gastrointestinal system: Abdomen is nondistended, soft and nontender. No organomegaly or masses felt. Normal bowel sounds  heard. Central nervous system: Alert and oriented. No focal neurological deficits. Extremities: Symmetric 5 x 5 power. Skin: No rashes, lesions or ulcers. Psychiatry: Judgement and insight appear normal. Mood & affect appropriate.   Data Reviewed: I  have personally reviewed following labs and imaging studies  CBC: Recent Labs  Lab 02/28/24 0904 02/29/24 0351 03/01/24 0443  WBC 17.5* 17.6* 17.5*  NEUTROABS  --  15.4* 15.2*  HGB 12.0 11.7* 11.6*  HCT 36.9 36.3 35.5*  MCV 87.9 87.7 88.5  PLT 251 248 272    Basic Metabolic Panel: Recent Labs  Lab 02/28/24 0904 02/29/24 0351 03/01/24 0443  NA 136 140 140  K 3.2* 3.8 4.0  CL 98 103 106  CO2 23 23 25   GLUCOSE 138* 153* 147*  BUN 15 16 16   CREATININE 0.79 0.78 0.63  CALCIUM  9.1 9.2 9.1  MG  --  2.4 2.2    CBG: No results for input(s): GLUCAP in the last 168 hours.  Recent Results (from the past 240 hours)  Resp panel by RT-PCR (RSV, Flu A&B, Covid) Anterior Nasal Swab     Status: None   Collection Time: 02/28/24  8:45 AM   Specimen: Anterior Nasal Swab  Result Value Ref Range Status   SARS Coronavirus 2 by RT PCR NEGATIVE NEGATIVE Final    Comment: (NOTE) SARS-CoV-2 target nucleic acids are NOT DETECTED.  The SARS-CoV-2 RNA is generally detectable in upper respiratory specimens during the acute phase of infection. The lowest concentration of SARS-CoV-2 viral copies this assay can detect is 138 copies/mL. A negative result does not preclude SARS-Cov-2 infection and should not be used as the sole basis for treatment or other patient management decisions. A negative result may occur with  improper specimen collection/handling, submission of specimen other than nasopharyngeal swab, presence of viral mutation(s) within the areas targeted by this assay, and inadequate number of viral copies(<138 copies/mL). A negative result must be combined with clinical observations, patient history, and epidemiological information. The expected result is Negative.  Fact Sheet for Patients:  BloggerCourse.com  Fact Sheet for Healthcare Providers:  SeriousBroker.it  This test is no t yet approved or cleared by the [place]   FDA and  has been authorized for detection and/or diagnosis of SARS-CoV-2 by FDA under an Emergency Use Authorization (EUA). This EUA will remain  in effect (meaning this test can be used) for the duration of the COVID-19 declaration under Section 564(b)(1) of the Act, 21 U.S.C.section 360bbb-3(b)(1), unless the authorization is terminated  or revoked sooner.       Influenza A by PCR NEGATIVE NEGATIVE Final   Influenza B by PCR NEGATIVE NEGATIVE Final    Comment: (NOTE) The Xpert Xpress SARS-CoV-2/FLU/RSV plus assay is intended as an aid in the diagnosis of influenza from Nasopharyngeal swab specimens and should not be used as a sole basis for treatment. Nasal washings and aspirates are unacceptable for Xpert Xpress SARS-CoV-2/FLU/RSV testing.  Fact Sheet for Patients: BloggerCourse.com  Fact Sheet for Healthcare Providers: SeriousBroker.it  This test is not yet approved or cleared by the [place]  FDA and has been authorized for detection and/or diagnosis of SARS-CoV-2 by FDA under an Emergency Use Authorization (EUA). This EUA will remain in effect (meaning this test can be used) for the duration of the COVID-19 declaration under Section 564(b)(1) of the Act, 21 U.S.C. section 360bbb-3(b)(1), unless the authorization is terminated or revoked.     Resp Syncytial Virus by PCR NEGATIVE NEGATIVE Final    Comment: (  NOTE) Fact Sheet for Patients: BloggerCourse.com  Fact Sheet for Healthcare Providers: SeriousBroker.it  This test is not yet approved or cleared by the [place]  FDA and has been authorized for detection and/or diagnosis of SARS-CoV-2 by FDA under an Emergency Use Authorization (EUA). This EUA will remain in effect (meaning this test can be used) for the duration of the COVID-19 declaration under Section 564(b)(1) of the Act, 21 U.S.C. section  360bbb-3(b)(1), unless the authorization is terminated or revoked.  Performed at [facility], [address], [place], [place] [address]   Culture, blood (routine x 2)     Status: None (Preliminary result)   Collection Time: 02/28/24  9:20 AM   Specimen: BLOOD  Result Value Ref Range Status   Specimen Description BLOOD BLOOD RIGHT FOREARM  Final   Special Requests   Final    BOTTLES DRAWN AEROBIC AND ANAEROBIC Blood Culture adequate volume   Culture   Final    NO GROWTH < 24 HOURS Performed at [facility], [address], [place], [place] [address]    Report Status PENDING  Incomplete  Culture, blood (routine x 2)     Status: None (Preliminary result)   Collection Time: 02/28/24  9:26 AM   Specimen: BLOOD  Result Value Ref Range Status   Specimen Description BLOOD RIGHT ANTECUBITAL  Final   Special Requests   Final    BOTTLES DRAWN AEROBIC AND ANAEROBIC Blood Culture adequate volume   Culture   Final    NO GROWTH < 24 HOURS Performed at [facility], [address] [place], [place] [address]    Report Status PENDING  Incomplete     Radiology Studies: DG CHEST PORT 1 VIEW Result Date: 03/01/2024 CLINICAL DATA:  [id number].  Multifocal pneumonia. EXAM: PORTABLE CHEST 1 VIEW COMPARISON:  Portable chest 02/28/2024 at 8:51 a.m. FINDINGS: 4:17 a.m. There is patchy hazy airspace disease in the right-greater-than-left lungs. The opacities have increased in both mid lung regions, more so on the right, and are otherwise not notably changed. The cardiac size is normal. There is calcification of the transverse aorta. Stable mediastinum. No vascular congestion is seen. There is levoscoliosis and degenerative change of the spine, mild osteopenia. IMPRESSION: Patchy hazy airspace disease in the right-greater-than-left lungs, increased in both mid lung regions, more so on the right. Findings consistent with multifocal pneumonia. Electronically Signed   By: Francis Quam M.D.   On: 03/01/2024 05:29     Scheduled Meds:  amLODipine   5 mg Oral Daily   aspirin   325 mg Oral QHS   atorvastatin   80 mg Oral QPM   azithromycin   500 mg Oral Daily   budesonide -glycopyrrolate -formoterol   2 puff Inhalation BID   enoxaparin  (LOVENOX ) injection  40 mg Subcutaneous Q24H   ezetimibe   10 mg Oral QPM   FLUoxetine   80 mg Oral Daily   guaiFENesin   1,200 mg Oral BID   hydrochlorothiazide   12.5 mg Oral Daily   And   irbesartan   300 mg Oral Daily   ipratropium-albuterol   3 mL Nebulization QID   loratadine   10 mg Oral QHS   methylPREDNISolone  (SOLU-MEDROL ) injection  40 mg Intravenous Q12H   nicotine   21 mg Transdermal Daily   pantoprazole   40 mg Oral QPM   roflumilast   500 mcg Oral Daily   Continuous Infusions:  cefTRIAXone  (ROCEPHIN )  IV 2 g (03/01/24 0834)     LOS: 2 days   Time spent: 50 mins  Jaylin Benzel, MD How to contact the  TRH Attending or Consulting provider 7A - 7P or covering provider during after hours 7P -7A, for this patient?  Check the care team in [facility] and look for a) attending/consulting TRH provider listed and b) the TRH team listed Log into [email or web address] to find provider on call.  Locate the TRH provider you are looking for under [facility] and page to a number that you can be directly reached. If you still have difficulty reaching the provider, please page the [facility] (Director on Call) for the Hospitalists listed on amion for assistance.  03/01/2024, 10:36 AM

## 2024-03-01 NOTE — Progress Notes (Signed)
   03/01/24 2043  BiPAP/CPAP/SIPAP  Reason BIPAP/CPAP not in use Non-compliant   Patient declines CPAP due to weight loss. She doesn't feel like she needs it anymore. No unit in room at this time. Patient knows one can be provided for her if she changes her mind.

## 2024-03-02 DIAGNOSIS — J181 Lobar pneumonia, unspecified organism: Secondary | ICD-10-CM

## 2024-03-02 DIAGNOSIS — J441 Chronic obstructive pulmonary disease with (acute) exacerbation: Secondary | ICD-10-CM | POA: Diagnosis not present

## 2024-03-02 DIAGNOSIS — E66812 Obesity, class 2: Secondary | ICD-10-CM

## 2024-03-02 DIAGNOSIS — J9601 Acute respiratory failure with hypoxia: Secondary | ICD-10-CM | POA: Diagnosis not present

## 2024-03-02 LAB — CBC WITH DIFFERENTIAL/PLATELET
Abs Immature Granulocytes: 0.31 K/uL — ABNORMAL HIGH (ref 0.00–0.07)
Basophils Absolute: 0.1 K/uL (ref 0.0–0.1)
Basophils Relative: 0 %
Eosinophils Absolute: 0 K/uL (ref 0.0–0.5)
Eosinophils Relative: 0 %
HCT: 33.7 % — ABNORMAL LOW (ref 36.0–46.0)
Hemoglobin: 10.7 g/dL — ABNORMAL LOW (ref 12.0–15.0)
Immature Granulocytes: 2 %
Lymphocytes Relative: 11 %
Lymphs Abs: 1.6 K/uL (ref 0.7–4.0)
MCH: 27.9 pg (ref 26.0–34.0)
MCHC: 31.8 g/dL (ref 30.0–36.0)
MCV: 88 fL (ref 80.0–100.0)
Monocytes Absolute: 0.9 K/uL (ref 0.1–1.0)
Monocytes Relative: 6 %
Neutro Abs: 12.5 K/uL — ABNORMAL HIGH (ref 1.7–7.7)
Neutrophils Relative %: 81 %
Platelets: 277 K/uL (ref 150–400)
RBC: 3.83 MIL/uL — ABNORMAL LOW (ref 3.87–5.11)
RDW: 13.4 % (ref 11.5–15.5)
WBC: 15.4 K/uL — ABNORMAL HIGH (ref 4.0–10.5)
nRBC: 0 % (ref 0.0–0.2)

## 2024-03-02 LAB — BASIC METABOLIC PANEL WITH GFR
Anion gap: 9 (ref 5–15)
BUN: 17 mg/dL (ref 8–23)
CO2: 26 mmol/L (ref 22–32)
Calcium: 9 mg/dL (ref 8.9–10.3)
Chloride: 103 mmol/L (ref 98–111)
Creatinine, Ser: 0.62 mg/dL (ref 0.44–1.00)
GFR, Estimated: >60 mL/min (ref >60)
Glucose, Bld: 150 mg/dL — ABNORMAL HIGH (ref 70–99)
Potassium: 3.9 mmol/L (ref 3.5–5.1)
Sodium: 138 mmol/L (ref 135–145)

## 2024-03-02 LAB — RESPIRATORY PANEL BY PCR

## 2024-03-02 MED ORDER — METHYLPREDNISOLONE SODIUM SUCC 125 MG IJ SOLR
60.0000 mg | Freq: Two times a day (BID) | INTRAMUSCULAR | Status: DC
  Administered 2024-03-02 – 2024-03-03 (×2): 60 mg via INTRAVENOUS
  Filled 2024-03-02 (×2): qty 2

## 2024-03-02 MED ORDER — BUDESONIDE 0.5 MG/2ML IN SUSP
0.5000 mg | Freq: Two times a day (BID) | RESPIRATORY_TRACT | Status: DC
  Administered 2024-03-02 – 2024-03-03 (×3): 0.5 mg via RESPIRATORY_TRACT
  Filled 2024-03-02 (×3): qty 2

## 2024-03-02 MED ORDER — ALBUTEROL SULFATE (2.5 MG/3ML) 0.083% IN NEBU
2.5000 mg | INHALATION_SOLUTION | Freq: Four times a day (QID) | RESPIRATORY_TRACT | Status: DC
  Administered 2024-03-03: 2.5 mg via RESPIRATORY_TRACT
  Filled 2024-03-02: qty 3

## 2024-03-02 MED ORDER — ARFORMOTEROL TARTRATE 15 MCG/2ML IN NEBU
15.0000 ug | INHALATION_SOLUTION | Freq: Two times a day (BID) | RESPIRATORY_TRACT | Status: DC
  Administered 2024-03-02 – 2024-03-03 (×3): 15 ug via RESPIRATORY_TRACT
  Filled 2024-03-02 (×3): qty 2

## 2024-03-02 MED ORDER — REVEFENACIN 175 MCG/3ML IN SOLN
175.0000 ug | Freq: Every day | RESPIRATORY_TRACT | Status: DC
  Administered 2024-03-03: 175 ug via RESPIRATORY_TRACT
  Filled 2024-03-02: qty 3

## 2024-03-02 NOTE — TOC Progression Note (Signed)
 Transition of Care [facility]) - Progression Note    Patient Details  Name: Jenna Hancock MRN: [id number] Date of Birth: [birth year]  Transition of Care [facility]) CM/SW Contact  Sharlyne Stabs, RN Phone Number: 03/02/2024, 10:12 AM  Clinical Narrative:   Patient admitted with COPD.  TOC consulted for home oxygen . Patient already has home oxygen , she is active with [employer]. She is asking about portable tanks. TOC sent Zach with [employer] a message, He is checking with patient to see what Equipment she has and what she is requesting.  TOC offered home health, patient does not feel she needs at this time.  Patient lives at home with her boyfriend. No other needs.   Expected Discharge Plan: Home/Self Care Barriers to Discharge: Continued Medical Work up   Expected Discharge Plan and Services      Living arrangements for the past 2 months: Single Family Home       Social Drivers of Health (SDOH) Interventions SDOH Screenings   Food Insecurity: No Food Insecurity (02/28/2024)  Housing: Low Risk  (02/28/2024)  Transportation Needs: No Transportation Needs (02/28/2024)  Utilities: Not At Risk (02/28/2024)  Depression (PHQ2-9): Low Risk  (10/16/2023)  Tobacco Use: Medium Risk (02/28/2024)

## 2024-03-02 NOTE — Progress Notes (Signed)
 PROGRESS NOTE  Jenna Hancock FMW:[id number] DOB: [birth year] DOA: 02/28/2024 PCP: Pcp, No  Brief History:  63 year old female with interstitial lung disease, COPD, active smoker, OSA, GERD, hypertension, chronic respiratory failure on nightly home O2, DOE, chronic rhinitis, chronic pain, anxiety and depression, class II obesity presented to the ED today for second opinion as she has continued to have increasing shortness of breath fever chills for the last 4 days.  She had been seen at an outside hospital 2 days ago and was prescribed prednisone  and cough syrup.  She continue to have productive sputum that was brown and yellowish.  She was noted to have desaturated when ambulating down to 87% on room air.  She was placed on supplemental oxygen  with some improvement in symptoms.  Her chest x-ray showed findings of multifocal pneumonia and emphysema.  Her acute viral testing has been negative for influenza RSV and SARS 2 coronavirus.  She was given treatments in the ED but her symptoms did not improve adequately and with a new oxygen  requirement and admission was requested for further management.   Assessment/Plan: Acute on chronic respiratory failure with hypoxia -Secondary to multifocal pneumonia and acute COPD exacerbation -chronically on 2L at night time -Currently on 2 L/min   Multifocal pneumonia -Continue IV ceftriaxone  azithromycin  -personally reviewed CXR--bilateral opacities -Repeat chest x-ray showing persistent infiltrates -COVID/RSV/Flu neg -- viral resp panel   Acute COPD exacerbation -add brovana  and pulmicort  - add Yupelri  -continue IV solumedrol   OSA -offer nightly CPAP while in hospital   GERD -Pantoprazole  ordered    Chronic rhinitis -Resumed loratidine   Chronic pain/opioid dependence -PDMP reviewed -norco 10/325, #90, last refill 9/15   Anxiety and depression -Resume home medications   Primary hypertension -Resume home medication for  management   Tobacco abuse -Counseled on cessation -Ordered nicotine  patch 21 mg daily      Family Communication:   spouse at bedside 9/17  Consultants:  none  Code Status:  FULL  DVT Prophylaxis: [place] Lovenox    Procedures: As Listed in Progress Note Above  Antibiotics: Lovenox  9/14>> Ceftriaxone  9/14>>   Subjective: Patient states that her breathing is better but she still has shortness of breath with minimal exertion.  She has nonproductive cough.  She denies any nausea, vomiting or diarrhea abdominal pain.  There is no hematochezia or melena.  No hemoptysis.  She denies any chest pain  Objective: Vitals:   03/01/24 2006 03/01/24 2139 03/02/24 0552 03/02/24 0711  BP:  (!) 143/67 (!) 152/75   Pulse:  87 75   Resp:      Temp:  98 F (36.7 C) 97.9 F (36.6 C)   TempSrc:  Oral Oral   SpO2: 92% 96% 94% 96%  Weight:      Height:       No intake or output data in the 24 hours ending 03/02/24 1114 Weight change:  Exam:  General:  Pt is alert, follows commands appropriately, not in acute distress HEENT: No icterus, No thrush, No neck mass, [place]/AT Cardiovascular: RRR, S1/S2, no rubs, no gallops Respiratory: Bilateral expiratory wheeze.  Bilateral rales. Abdomen: Soft/+BS, non tender, non distended, no guarding Extremities: No edema, No lymphangitis, No petechiae, No rashes, no synovitis   Data Reviewed: I have personally reviewed following labs and imaging studies Basic Metabolic Panel: Recent Labs  Lab 02/28/24 0904 02/29/24 0351 03/01/24 0443 03/02/24 0437  NA 136 140 140 138  K 3.2* 3.8 4.0  3.9  CL 98 103 106 103  CO2 23 23 25 26   GLUCOSE 138* 153* 147* 150*  BUN 15 16 16 17   CREATININE 0.79 0.78 0.63 0.62  CALCIUM  9.1 9.2 9.1 9.0  MG  --  2.4 2.2  --    Liver Function Tests: No results for input(s): AST, ALT, ALKPHOS, BILITOT, PROT, ALBUMIN in the last 168 hours. No results for input(s): LIPASE, AMYLASE in the last 168 hours. No  results for input(s): AMMONIA in the last 168 hours. Coagulation Profile: No results for input(s): INR, PROTIME in the last 168 hours. CBC: Recent Labs  Lab 02/28/24 0904 02/29/24 0351 03/01/24 0443 03/02/24 0437  WBC 17.5* 17.6* 17.5* 15.4*  NEUTROABS  --  15.4* 15.2* 12.5*  HGB 12.0 11.7* 11.6* 10.7*  HCT 36.9 36.3 35.5* 33.7*  MCV 87.9 87.7 88.5 88.0  PLT 251 248 272 277   Cardiac Enzymes: No results for input(s): CKTOTAL, CKMB, CKMBINDEX, TROPONINI in the last 168 hours. BNP: Invalid input(s): POCBNP CBG: No results for input(s): GLUCAP in the last 168 hours. HbA1C: No results for input(s): HGBA1C in the last 72 hours. Urine analysis:    Component Value Date/Time   COLORURINE YELLOW 01/26/2020 1106   APPEARANCEUR CLEAR 01/26/2020 1106   LABSPEC 1.028 01/26/2020 1106   PHURINE 5.0 01/26/2020 1106   GLUCOSEU NEGATIVE 01/26/2020 1106   HGBUR NEGATIVE 01/26/2020 1106   BILIRUBINUR NEGATIVE 01/26/2020 1106   KETONESUR 5 (A) 01/26/2020 1106   PROTEINUR NEGATIVE 01/26/2020 1106   NITRITE NEGATIVE 01/26/2020 1106   LEUKOCYTESUR NEGATIVE 01/26/2020 1106   Sepsis Labs: @LABRCNTIP (procalcitonin:4,lacticidven:4) ) Recent Results (from the past 240 hours)  Resp panel by RT-PCR (RSV, Flu A&B, Covid) Anterior Nasal Swab     Status: None   Collection Time: 02/28/24  8:45 AM   Specimen: Anterior Nasal Swab  Result Value Ref Range Status   SARS Coronavirus 2 by RT PCR NEGATIVE NEGATIVE Final    Comment: (NOTE) SARS-CoV-2 target nucleic acids are NOT DETECTED.  The SARS-CoV-2 RNA is generally detectable in upper respiratory specimens during the acute phase of infection. The lowest concentration of SARS-CoV-2 viral copies this assay can detect is 138 copies/mL. A negative result does not preclude SARS-Cov-2 infection and should not be used as the sole basis for treatment or other patient management decisions. A negative result may occur with  improper  specimen collection/handling, submission of specimen other than nasopharyngeal swab, presence of viral mutation(s) within the areas targeted by this assay, and inadequate number of viral copies(<138 copies/mL). A negative result must be combined with clinical observations, patient history, and epidemiological information. The expected result is Negative.  Fact Sheet for Patients:  BloggerCourse.com  Fact Sheet for Healthcare Providers:  SeriousBroker.it  This test is no t yet approved or cleared by the [place]  FDA and  has been authorized for detection and/or diagnosis of SARS-CoV-2 by FDA under an Emergency Use Authorization (EUA). This EUA will remain  in effect (meaning this test can be used) for the duration of the COVID-19 declaration under Section 564(b)(1) of the Act, 21 U.S.C.section 360bbb-3(b)(1), unless the authorization is terminated  or revoked sooner.       Influenza A by PCR NEGATIVE NEGATIVE Final   Influenza B by PCR NEGATIVE NEGATIVE Final    Comment: (NOTE) The Xpert Xpress SARS-CoV-2/FLU/RSV plus assay is intended as an aid in the diagnosis of influenza from Nasopharyngeal swab specimens and should not be used as a sole basis for treatment.  Nasal washings and aspirates are unacceptable for Xpert Xpress SARS-CoV-2/FLU/RSV testing.  Fact Sheet for Patients: BloggerCourse.com  Fact Sheet for Healthcare Providers: SeriousBroker.it  This test is not yet approved or cleared by the [place]  FDA and has been authorized for detection and/or diagnosis of SARS-CoV-2 by FDA under an Emergency Use Authorization (EUA). This EUA will remain in effect (meaning this test can be used) for the duration of the COVID-19 declaration under Section 564(b)(1) of the Act, 21 U.S.C. section 360bbb-3(b)(1), unless the authorization is terminated or revoked.     Resp  Syncytial Virus by PCR NEGATIVE NEGATIVE Final    Comment: (NOTE) Fact Sheet for Patients: BloggerCourse.com  Fact Sheet for Healthcare Providers: SeriousBroker.it  This test is not yet approved or cleared by the [place]  FDA and has been authorized for detection and/or diagnosis of SARS-CoV-2 by FDA under an Emergency Use Authorization (EUA). This EUA will remain in effect (meaning this test can be used) for the duration of the COVID-19 declaration under Section 564(b)(1) of the Act, 21 U.S.C. section 360bbb-3(b)(1), unless the authorization is terminated or revoked.  Performed at [facility], [address] [place], [place] [address]   Culture, blood (routine x 2)     Status: None (Preliminary result)   Collection Time: 02/28/24  9:20 AM   Specimen: BLOOD  Result Value Ref Range Status   Specimen Description   Final    BLOOD BLOOD RIGHT FOREARM Performed at [facility], [address] [place], [place] [address]    Special Requests   Final    BOTTLES DRAWN AEROBIC AND ANAEROBIC Blood Culture adequate volume Performed at [facility], [address] [place], [place] [address]    Culture   Final    NO GROWTH 2 DAYS Performed at [facility], [address], [place], [place] [address]    Report Status PENDING  Incomplete  Culture, blood (routine x 2)     Status: None (Preliminary result)   Collection Time: 02/28/24  9:26 AM   Specimen: BLOOD  Result Value Ref Range Status   Specimen Description   Final    BLOOD RIGHT ANTECUBITAL Performed at [facility], [address], [place], [place] [address]    Special Requests   Final    BOTTLES DRAWN AEROBIC AND ANAEROBIC Blood Culture adequate volume Performed at [facility], [address], [place], [place] [address]    Culture   Final    NO GROWTH 2 DAYS Performed at [facility], [address] [place], [place] [address]    Report Status PENDING   Incomplete     Scheduled Meds:  [START ON 03/03/2024] albuterol   2.5 mg Nebulization Q6H   amLODipine   5 mg Oral Daily   arformoterol   15 mcg Nebulization BID   aspirin   325 mg Oral QHS   atorvastatin   80 mg Oral QPM   azithromycin   500 mg Oral Daily   budesonide  (PULMICORT ) nebulizer solution  0.5 mg Nebulization BID   enoxaparin  (LOVENOX ) injection  40 mg Subcutaneous Q24H   ezetimibe   10 mg Oral QPM   FLUoxetine   80 mg Oral Daily   guaiFENesin   1,200 mg Oral BID   hydrochlorothiazide   12.5 mg Oral Daily   And   irbesartan   300 mg Oral Daily   ipratropium-albuterol   3 mL Nebulization QID   loratadine   10 mg Oral QHS   methylPREDNISolone  (SOLU-MEDROL ) injection  60 mg Intravenous Q12H   nicotine   21  mg Transdermal Daily   pantoprazole   40 mg Oral QPM   [START ON 03/03/2024] revefenacin   175 mcg Nebulization Daily   roflumilast   500 mcg Oral Daily   Continuous Infusions:  cefTRIAXone  (ROCEPHIN )  IV 2 g (03/02/24 0949)    Procedures/Studies: DG CHEST PORT 1 VIEW Result Date: 03/01/2024 CLINICAL DATA:  [id number].  Multifocal pneumonia. EXAM: PORTABLE CHEST 1 VIEW COMPARISON:  Portable chest 02/28/2024 at 8:51 a.m. FINDINGS: 4:17 a.m. There is patchy hazy airspace disease in the right-greater-than-left lungs. The opacities have increased in both mid lung regions, more so on the right, and are otherwise not notably changed. The cardiac size is normal. There is calcification of the transverse aorta. Stable mediastinum. No vascular congestion is seen. There is levoscoliosis and degenerative change of the spine, mild osteopenia. IMPRESSION: Patchy hazy airspace disease in the right-greater-than-left lungs, increased in both mid lung regions, more so on the right. Findings consistent with multifocal pneumonia. Electronically Signed   By: Francis Quam M.D.   On: 03/01/2024 05:29   DG Chest Portable 1 View Result Date: 02/28/2024 EXAM: 1 VIEW XRAY OF THE CHEST 02/28/2024 08:55:07 AM  COMPARISON: 09/09/2022 CLINICAL HISTORY: Cough, shortness of breath. Per Triage: Patient arrives ambulatory by POV c/o shortness of breath, fevers and chills since Thursday. Went to hospital in [facility] Friday and was prescribed prednisone  and cough medicine. Patient has COPD and on 2L [place] at night. Reports brownish; yellow mucus with cough. FINDINGS: LUNGS AND PLEURA: Patchy bilateral airspace opacities within the mid and upper lung zones. No pleural fluid, interstitial edema or pneumothorax. HEART AND MEDIASTINUM: Aortic calcification. BONES AND SOFT TISSUES: Multilevel degenerative changes of thoracic spine. No acute osseous abnormality. IMPRESSION: 1. Patchy bilateral airspace opacities within the mid and upper lung zones, possibly related to the patient's reported cough and shortness of breath. Electronically signed by: Waddell Calk MD 02/28/2024 09:08 AM EDT RP Workstation: SHEREE Lenis Lux Meaders, DO  [facility]  If 7PM-7AM, please contact night-coverage [email or web address] Password [email or web address] 03/02/2024, 11:14 AM   LOS: 3 days

## 2024-03-02 NOTE — Plan of Care (Signed)
  Problem: Education: Goal: Knowledge of General Education information will improve Description: Including pain rating scale, medication(s)/side effects and non-pharmacologic comfort measures 03/02/2024 1848 by Lynnette Cena CROME, RN Outcome: Progressing 03/02/2024 1838 by Lynnette Cena CROME, RN Outcome: Progressing   Problem: Health Behavior/Discharge Planning: Goal: Ability to manage health-related needs will improve Outcome: Progressing

## 2024-03-02 NOTE — Plan of Care (Signed)
  Problem: Education: Goal: Knowledge of General Education information will improve Description: Including pain rating scale, medication(s)/side effects and non-pharmacologic comfort measures 03/02/2024 1849 by Lynnette Cena CROME, RN Outcome: Progressing 03/02/2024 1848 by Lynnette Cena CROME, RN Outcome: Progressing 03/02/2024 1838 by Lynnette Cena CROME, RN Outcome: Progressing   Problem: Health Behavior/Discharge Planning: Goal: Ability to manage health-related needs will improve Outcome: Progressing

## 2024-03-02 NOTE — Plan of Care (Signed)
   Problem: Education: Goal: Knowledge of General Education information will improve Description Including pain rating scale, medication(s)/side effects and non-pharmacologic comfort measures Outcome: Progressing

## 2024-03-02 NOTE — Progress Notes (Signed)
 Mobility Specialist Progress Note:    03/02/24 1030  Mobility  Activity Ambulated with assistance  Level of Assistance Independent  Assistive Device None  Distance Ambulated (ft) 140 ft  Range of Motion/Exercises Active;All extremities  Activity Response Tolerated well  Mobility Referral Yes  Mobility visit 1 Mobility  Mobility Specialist Start Time (ACUTE ONLY) 1030  Mobility Specialist Stop Time (ACUTE ONLY) 1051  Mobility Specialist Time Calculation (min) (ACUTE ONLY) 21 min   Pt received sitting EOB, agreeable to mobility. Independently able to stand and ambulate with no AD. Tolerated well, SpO2 93% on 2L at rest. SpO2 86-92% on 2L during ambulation, required 3L to bring O2 up to 93% during ambulation. Returned pt sitting EOB, family in room. All needs met.  Jenna Hancock Mobility Specialist Please contact via Special educational needs teacher or  Rehab office at [phone]

## 2024-03-03 DIAGNOSIS — J849 Interstitial pulmonary disease, unspecified: Secondary | ICD-10-CM

## 2024-03-03 LAB — CBC WITH DIFFERENTIAL/PLATELET
Abs Immature Granulocytes: 0.69 K/uL — ABNORMAL HIGH (ref 0.00–0.07)
Basophils Absolute: 0.1 K/uL (ref 0.0–0.1)
Basophils Relative: 1 %
Eosinophils Absolute: 0 K/uL (ref 0.0–0.5)
Eosinophils Relative: 0 %
HCT: 37.2 % (ref 36.0–46.0)
Hemoglobin: 11.8 g/dL — ABNORMAL LOW (ref 12.0–15.0)
Immature Granulocytes: 5 %
Lymphocytes Relative: 11 %
Lymphs Abs: 1.6 K/uL (ref 0.7–4.0)
MCH: 28 pg (ref 26.0–34.0)
MCHC: 31.7 g/dL (ref 30.0–36.0)
MCV: 88.4 fL (ref 80.0–100.0)
Monocytes Absolute: 0.7 K/uL (ref 0.1–1.0)
Monocytes Relative: 5 %
Neutro Abs: 10.7 K/uL — ABNORMAL HIGH (ref 1.7–7.7)
Neutrophils Relative %: 78 %
Platelets: 269 K/uL (ref 150–400)
RBC: 4.21 MIL/uL (ref 3.87–5.11)
RDW: 13.2 % (ref 11.5–15.5)
WBC: 13.8 K/uL — ABNORMAL HIGH (ref 4.0–10.5)
nRBC: 0 % (ref 0.0–0.2)

## 2024-03-03 LAB — BASIC METABOLIC PANEL WITH GFR
Anion gap: 10 (ref 5–15)
BUN: 18 mg/dL (ref 8–23)
CO2: 26 mmol/L (ref 22–32)
Calcium: 9.1 mg/dL (ref 8.9–10.3)
Chloride: 105 mmol/L (ref 98–111)
Creatinine, Ser: 0.68 mg/dL (ref 0.44–1.00)
GFR, Estimated: >60 mL/min (ref >60)
Glucose, Bld: 148 mg/dL — ABNORMAL HIGH (ref 70–99)
Potassium: 4.4 mmol/L (ref 3.5–5.1)
Sodium: 141 mmol/L (ref 135–145)

## 2024-03-03 MED ORDER — CEFDINIR 300 MG PO CAPS: 300.0000 mg | ORAL_CAPSULE | Freq: Two times a day (BID) | ORAL | 0 refills | Status: DC

## 2024-03-03 MED ORDER — NICOTINE 21 MG/24HR TD PT24: 21.0000 mg | MEDICATED_PATCH | Freq: Every day | TRANSDERMAL | 0 refills | Status: DC

## 2024-03-03 MED ORDER — PREDNISONE 20 MG PO TABS: 60.0000 mg | ORAL_TABLET | Freq: Every day | ORAL | Status: DC

## 2024-03-03 MED ORDER — PREDNISONE 10 MG PO TABS: 60.0000 mg | ORAL_TABLET | Freq: Every day | ORAL | 0 refills | Status: DC

## 2024-03-03 NOTE — Discharge Summary (Addendum)
 Physician Discharge Summary   Patient: Jenna Hancock MRN: [id number] DOB: Feb 19, 1961  Admit date:     02/28/2024  Discharge date: 03/03/24  Discharge Physician: Alm Tawona Filsinger   PCP: Pcp, No   Recommendations at discharge:   Please follow up with primary care provider within 1-2 weeks  Please repeat BMP and CBC in one week     Hospital Course: 64 year old female with interstitial lung disease, COPD, active smoker, OSA, GERD, hypertension, chronic respiratory failure on nightly home O2, DOE, chronic rhinitis, chronic pain, anxiety and depression, class II obesity presented to the ED today for second opinion as she has continued to have increasing shortness of breath fever chills for the last 4 days.  She had been seen at an outside hospital 2 days ago and was prescribed prednisone  and cough syrup.  She continue to have productive sputum that was brown and yellowish.  She was noted to have desaturated when ambulating down to 87% on room air.  She was placed on supplemental oxygen  with some improvement in symptoms.  Her chest x-ray showed findings of multifocal pneumonia and emphysema.  Her acute viral testing has been negative for influenza RSV and SARS 2 coronavirus.  She was given treatments in the ED but her symptoms did not improve adequately and with a new oxygen  requirement and admission was requested for further management.  Assessment and Plan: Acute on chronic respiratory failure with hypoxia -Secondary to multifocal pneumonia and acute COPD exacerbation -chronically on 2L at night time -will go home with oxygen  2L on 24/7 basis - repeat ambulatory   Multifocal pneumonia -Continue IV ceftriaxone  azithromycin  -personally reviewed CXR--bilateral opacities -Repeat chest x-ray showing persistent infiltrates -COVID/RSV/Flu neg -- viral resp panel +rhinovirus/enterovirus - received  5 days azithro; d/c home with 2 more days cefdinir    Acute COPD exacerbation -added brovana  and  pulmicort  - added Yupelri  -continued IV solumedrol -d/c home with prednisone  taper   OSA -offer nightly CPAP while in hospital   GERD -Pantoprazole  ordered    Chronic rhinitis -Resumed loratidine   Chronic pain/opioid dependence -PDMP reviewed -norco 10/325, #90, last refill 9/15   Anxiety and depression -Resume home medications   Primary hypertension -Resume home medication for management   Tobacco abuse -Counseled on cessation -Ordered nicotine  patch 21 mg daily           Consultants: none Procedures performed: home  Disposition: Home Diet recommendation:  Cardiac diet DISCHARGE MEDICATION: Allergies as of 03/03/2024       Reactions   Other Other (See Comments)   Product containing angiotensin-converting enzyme inhibitor (product)   Ace Inhibitors Nausea Only   Bactrim Rash   Sulfa Antibiotics Rash        Medication List     STOP taking these medications    Medrol  4 MG Tbpk tablet Generic drug: methylPREDNISolone        TAKE these medications    ALPRAZolam  0.5 MG tablet Commonly known as: XANAX  Take 1 tablet (0.5 mg total) by mouth 2 (two) times daily as needed for anxiety.   amLODipine  5 MG tablet Commonly known as: NORVASC  Take 1 tablet (5 mg total) by mouth daily.   aspirin  325 MG tablet Take 325 mg by mouth at bedtime.   atorvastatin  80 MG tablet Commonly known as: LIPITOR  Take 1 tablet (80 mg total) by mouth daily.   azelastine  0.1 % nasal spray Commonly known as: ASTELIN  Place 1 spray into both nostrils 2 (two) times daily as needed for rhinitis. Use  in each nostril as directed   Breztri  Aerosphere 160-9-4.8 MCG/ACT Aero inhaler Generic drug: budesonide -glycopyrrolate -formoterol  Inhale 2 puffs into the lungs in the morning and at bedtime. What changed:  when to take this reasons to take this   cefdinir  300 MG capsule Commonly known as: OMNICEF  Take 1 capsule (300 mg total) by mouth 2 (two) times daily. Start taking on:  March 04, 2024   cetirizine  10 MG tablet Commonly known as: ZyrTEC  Allergy  Take 1 tablet (10 mg total) by mouth daily.   ezetimibe  10 MG tablet Commonly known as: ZETIA  Take 10 mg by mouth every evening.   Fish Oil 1000 MG Caps Take 1 capsule by mouth daily.   FLUoxetine  40 MG capsule Commonly known as: PROZAC  TAKE 2 CAPSULES EVERY DAY   fluticasone  50 MCG/ACT nasal spray Commonly known as: FLONASE  Place 2 sprays into both nostrils daily. What changed:  when to take this reasons to take this   folic acid  800 MCG tablet Commonly known as: FOLVITE  Take 800 mcg by mouth daily.   HYDROcodone -acetaminophen  10-325 MG tablet Commonly known as: NORCO Take 1 tablet by mouth in the morning and at bedtime.   IBU 800 MG tablet Generic drug: ibuprofen  Take 800 mg by mouth daily as needed for mild pain (pain score 1-3) or moderate pain (pain score 4-6).   ipratropium-albuterol  0.5-2.5 (3) MG/3ML Soln Commonly known as: DUONEB Take 3 mLs by nebulization 4 (four) times daily. What changed:  when to take this reasons to take this   irbesartan -hydrochlorothiazide  300-12.5 MG tablet Commonly known as: AVALIDE Take 1 tablet by mouth daily.   multivitamin with minerals tablet Take 1 tablet by mouth daily.   nicotine  21 mg/24hr patch Commonly known as: NICODERM CQ  - dosed in mg/24 hours Place 1 patch (21 mg total) onto the skin daily. Start taking on: March 04, 2024   pantoprazole  40 MG tablet Commonly known as: PROTONIX  Take 1 tablet (40 mg total) by mouth daily.   predniSONE  10 MG tablet Commonly known as: DELTASONE  Take 6 tablets (60 mg total) by mouth daily with breakfast. And decrease by one tablet daily Start taking on: March 04, 2024   roflumilast  500 MCG Tabs tablet Commonly known as: DALIRESP  TAKE 1 TABLET EVERY DAY               Durable Medical Equipment  (From admission, onward)           Start     Ordered   03/03/24 0924  For home  use only DME oxygen   Once       Comments: POC for COPD  Question Answer Comment  Length of Need 6 Months   Mode or (Route) Nasal cannula   Liters per Minute 2   Frequency Continuous (stationary and portable oxygen  unit needed)   Oxygen  conserving device Yes   Oxygen  delivery system Gas      03/03/24 0923   02/29/24 1610  For home use only DME oxygen   Once       Comments: POC FOR COPD  Question Answer Comment  Length of Need Lifetime   Mode or (Route) Nasal cannula   Liters per Minute 3   Frequency Continuous (stationary and portable oxygen  unit needed)   Oxygen  conserving device Yes   Oxygen  delivery system Gas      02/29/24 1610            Discharge Exam: [facility] Weights   02/28/24 0843  Weight: 90.7 kg  HEENT:  [place]/AT, No thrush, no icterus CV:  RRR, no rub, no S3, no S4 Lung:  diminished BS.  Scattered rales.  Now heeze Abd:  soft/+BS, NT Ext:  No edema, no lymphangitis, no synovitis, no rash   Condition at discharge: stable  The results of significant diagnostics from this hospitalization (including imaging, microbiology, ancillary and laboratory) are listed below for reference.   Imaging Studies: DG CHEST PORT 1 VIEW Result Date: 03/01/2024 CLINICAL DATA:  [id number].  Multifocal pneumonia. EXAM: PORTABLE CHEST 1 VIEW COMPARISON:  Portable chest 02/28/2024 at 8:51 a.m. FINDINGS: 4:17 a.m. There is patchy hazy airspace disease in the right-greater-than-left lungs. The opacities have increased in both mid lung regions, more so on the right, and are otherwise not notably changed. The cardiac size is normal. There is calcification of the transverse aorta. Stable mediastinum. No vascular congestion is seen. There is levoscoliosis and degenerative change of the spine, mild osteopenia. IMPRESSION: Patchy hazy airspace disease in the right-greater-than-left lungs, increased in both mid lung regions, more so on the right. Findings consistent with multifocal pneumonia.  Electronically Signed   By: Francis Quam M.D.   On: 03/01/2024 05:29   DG Chest Portable 1 View Result Date: 02/28/2024 EXAM: 1 VIEW XRAY OF THE CHEST 02/28/2024 08:55:07 AM COMPARISON: 09/09/2022 CLINICAL HISTORY: Cough, shortness of breath. Per Triage: Patient arrives ambulatory by POV c/o shortness of breath, fevers and chills since Thursday. Went to hospital in [facility] Friday and was prescribed prednisone  and cough medicine. Patient has COPD and on 2L [place] at night. Reports brownish; yellow mucus with cough. FINDINGS: LUNGS AND PLEURA: Patchy bilateral airspace opacities within the mid and upper lung zones. No pleural fluid, interstitial edema or pneumothorax. HEART AND MEDIASTINUM: Aortic calcification. BONES AND SOFT TISSUES: Multilevel degenerative changes of thoracic spine. No acute osseous abnormality. IMPRESSION: 1. Patchy bilateral airspace opacities within the mid and upper lung zones, possibly related to the patient's reported cough and shortness of breath. Electronically signed by: Waddell Calk MD 02/28/2024 09:08 AM EDT RP Workstation: [id number]    Microbiology: Results for orders placed or performed during the hospital encounter of 02/28/24  Resp panel by RT-PCR (RSV, Flu A&B, Covid) Anterior Nasal Swab     Status: None   Collection Time: 02/28/24  8:45 AM   Specimen: Anterior Nasal Swab  Result Value Ref Range Status   SARS Coronavirus 2 by RT PCR NEGATIVE NEGATIVE Final    Comment: (NOTE) SARS-CoV-2 target nucleic acids are NOT DETECTED.  The SARS-CoV-2 RNA is generally detectable in upper respiratory specimens during the acute phase of infection. The lowest concentration of SARS-CoV-2 viral copies this assay can detect is 138 copies/mL. A negative result does not preclude SARS-Cov-2 infection and should not be used as the sole basis for treatment or other patient management decisions. A negative result may occur with  improper specimen collection/handling, submission of  specimen other than nasopharyngeal swab, presence of viral mutation(s) within the areas targeted by this assay, and inadequate number of viral copies(<138 copies/mL). A negative result must be combined with clinical observations, patient history, and epidemiological information. The expected result is Negative.  Fact Sheet for Patients:  BloggerCourse.com  Fact Sheet for Healthcare Providers:  SeriousBroker.it  This test is no t yet approved or cleared by the [place]  FDA and  has been authorized for detection and/or diagnosis of SARS-CoV-2 by FDA under an Emergency Use Authorization (EUA). This EUA will remain  in effect (meaning this test can be used)  for the duration of the COVID-19 declaration under Section 564(b)(1) of the Act, 21 U.S.C.section 360bbb-3(b)(1), unless the authorization is terminated  or revoked sooner.       Influenza A by PCR NEGATIVE NEGATIVE Final   Influenza B by PCR NEGATIVE NEGATIVE Final    Comment: (NOTE) The Xpert Xpress SARS-CoV-2/FLU/RSV plus assay is intended as an aid in the diagnosis of influenza from Nasopharyngeal swab specimens and should not be used as a sole basis for treatment. Nasal washings and aspirates are unacceptable for Xpert Xpress SARS-CoV-2/FLU/RSV testing.  Fact Sheet for Patients: BloggerCourse.com  Fact Sheet for Healthcare Providers: SeriousBroker.it  This test is not yet approved or cleared by the [place]  FDA and has been authorized for detection and/or diagnosis of SARS-CoV-2 by FDA under an Emergency Use Authorization (EUA). This EUA will remain in effect (meaning this test can be used) for the duration of the COVID-19 declaration under Section 564(b)(1) of the Act, 21 U.S.C. section 360bbb-3(b)(1), unless the authorization is terminated or revoked.     Resp Syncytial Virus by PCR NEGATIVE NEGATIVE Final     Comment: (NOTE) Fact Sheet for Patients: BloggerCourse.com  Fact Sheet for Healthcare Providers: SeriousBroker.it  This test is not yet approved or cleared by the [place]  FDA and has been authorized for detection and/or diagnosis of SARS-CoV-2 by FDA under an Emergency Use Authorization (EUA). This EUA will remain in effect (meaning this test can be used) for the duration of the COVID-19 declaration under Section 564(b)(1) of the Act, 21 U.S.C. section 360bbb-3(b)(1), unless the authorization is terminated or revoked.  Performed at [facility], [address] [place], [place] [address]   Culture, blood (routine x 2)     Status: None (Preliminary result)   Collection Time: 02/28/24  9:20 AM   Specimen: BLOOD  Result Value Ref Range Status   Specimen Description BLOOD BLOOD RIGHT FOREARM  Final   Special Requests   Final    BOTTLES DRAWN AEROBIC AND ANAEROBIC Blood Culture adequate volume   Culture   Final    NO GROWTH 4 DAYS Performed at [facility], [address] [place], [place] [address]    Report Status PENDING  Incomplete  Culture, blood (routine x 2)     Status: None (Preliminary result)   Collection Time: 02/28/24  9:26 AM   Specimen: BLOOD  Result Value Ref Range Status   Specimen Description BLOOD RIGHT ANTECUBITAL  Final   Special Requests   Final    BOTTLES DRAWN AEROBIC AND ANAEROBIC Blood Culture adequate volume   Culture   Final    NO GROWTH 4 DAYS Performed at [facility], [address], [place], [place] [address]    Report Status PENDING  Incomplete  Respiratory (~20 pathogens) panel by PCR     Status: Abnormal   Collection Time: 03/02/24 12:13 PM   Specimen: Nasopharyngeal Swab; Respiratory  Result Value Ref Range Status   Adenovirus NOT DETECTED NOT DETECTED Final   Coronavirus 229E NOT DETECTED NOT DETECTED Final    Comment: (NOTE) The Coronavirus on the Respiratory Panel, DOES NOT test  for the novel  Coronavirus (2019 nCoV)    Coronavirus HKU1 NOT DETECTED NOT DETECTED Final   Coronavirus NL63 NOT DETECTED NOT DETECTED Final   Coronavirus OC43 NOT DETECTED NOT DETECTED Final   Metapneumovirus NOT DETECTED NOT DETECTED Final   Rhinovirus / Enterovirus DETECTED (A) NOT DETECTED Final   Influenza A NOT DETECTED NOT DETECTED Final   Influenza  B NOT DETECTED NOT DETECTED Final   Parainfluenza Virus 1 NOT DETECTED NOT DETECTED Final   Parainfluenza Virus 2 NOT DETECTED NOT DETECTED Final   Parainfluenza Virus 3 NOT DETECTED NOT DETECTED Final   Parainfluenza Virus 4 NOT DETECTED NOT DETECTED Final   Respiratory Syncytial Virus NOT DETECTED NOT DETECTED Final   Bordetella pertussis NOT DETECTED NOT DETECTED Final   Bordetella Parapertussis NOT DETECTED NOT DETECTED Final   Chlamydophila pneumoniae NOT DETECTED NOT DETECTED Final   Mycoplasma pneumoniae NOT DETECTED NOT DETECTED Final    Comment: Performed at [facility], [address], [place], [place] [address]    Labs: CBC: Recent Labs  Lab 02/28/24 [phone] 02/29/24 0351 03/01/24 0443 03/02/24 0437 03/03/24 0450  WBC 17.5* 17.6* 17.5* 15.4* 13.8*  NEUTROABS  --  15.4* 15.2* 12.5* 10.7*  HGB 12.0 11.7* 11.6* 10.7* 11.8*  HCT 36.9 36.3 35.5* 33.7* 37.2  MCV 87.9 87.7 88.5 88.0 88.4  PLT 251 248 272 277 269   Basic Metabolic Panel: Recent Labs  Lab 02/28/24 0904 02/29/24 0351 03/01/24 0443 03/02/24 0437 03/03/24 0450  NA 136 140 140 138 141  K 3.2* 3.8 4.0 3.9 4.4  CL 98 103 106 103 105  CO2 23 23 25 26 26   GLUCOSE 138* 153* 147* 150* 148*  BUN 15 16 16 17 18   CREATININE 0.79 0.78 0.63 0.62 0.68  CALCIUM  9.1 9.2 9.1 9.0 9.1  MG  --  2.4 2.2  --   --    Liver Function Tests: No results for input(s): AST, ALT, ALKPHOS, BILITOT, PROT, ALBUMIN in the last 168 hours. CBG: No results for input(s): GLUCAP in the last 168 hours.  Discharge time spent: greater than 30  minutes.  Signed: Alm Schneider, MD [facility] 03/03/2024

## 2024-03-03 NOTE — Progress Notes (Signed)
 Pt alert and oriented x 4 upon DC. She agrees to keep follow up appts and see her PCP. She is equipped with O2 for home. Kellogg RN

## 2024-03-03 NOTE — Progress Notes (Signed)
 SATURATION QUALIFICATIONS: (This note is used to comply with regulatory documentation for home oxygen )   Patient Saturations on Room Air at Rest = 95   Patient Saturations on Room Air while Ambulating = 83   Patient Saturations on 2 Liters of oxygen  while Ambulating = 94   Please briefly explain why patient needs home oxygen : To maintain 02 sat at 90% or above during ambulation.  Alm Schneider, DO

## 2024-03-03 NOTE — TOC Transition Note (Signed)
 Transition of Care [facility]) - Discharge Note   Patient Details  Name: Jenna Hancock MRN: [id number] Date of Birth: 1961/05/18  Transition of Care [facility]) CM/SW Contact:  Sharlyne Stabs, RN Phone Number: 03/03/2024, 9:59 AM   Clinical Narrative:   Patient discharging home, Zach with [employer] delivered a tank for transport home. No other needs.    Final next level of care: Home/Self Care Barriers to Discharge: Barriers Resolved   Patient Goals and CMS Choice Patient states their goals for this hospitalization and ongoing recovery are:: return home CMS Medicare.gov Compare Post Acute Care list provided to:: Patient Choice offered to / list presented to : Patient    Discharge Placement                    Patient and family notified of of transfer: 03/03/24  Discharge Plan and Services Additional resources added to the After Visit Summary for                 DME Agency: [employer]     Social Drivers of Health (SDOH) Interventions SDOH Screenings   Food Insecurity: No Food Insecurity (02/28/2024)  Housing: Low Risk  (02/28/2024)  Transportation Needs: No Transportation Needs (02/28/2024)  Utilities: Not At Risk (02/28/2024)  Depression (PHQ2-9): Low Risk  (10/16/2023)  Tobacco Use: Medium Risk (02/28/2024)

## 2024-03-03 NOTE — Care Management Important Message (Signed)
 Important Message  Patient Details  Name: Jenna Hancock MRN: [id number] Date of Birth: 02/29/1961   Important Message Given:  Yes - Medicare IM     [name] [name] 03/03/2024, 10:02 AM

## 2024-03-04 LAB — CULTURE, BLOOD (ROUTINE X 2)
Culture: NO GROWTH
Culture: NO GROWTH
Special Requests: ADEQUATE
Special Requests: ADEQUATE

## 2024-03-06 ENCOUNTER — Other Ambulatory Visit: Payer: Self-pay

## 2024-03-06 DIAGNOSIS — J849 Interstitial pulmonary disease, unspecified: Secondary | ICD-10-CM

## 2024-03-19 ENCOUNTER — Other Ambulatory Visit: Payer: Self-pay

## 2024-03-19 ENCOUNTER — Other Ambulatory Visit: Payer: Self-pay | Admitting: Allergy

## 2024-03-22 ENCOUNTER — Ambulatory Visit: Admitting: Adult Health

## 2024-03-22 ENCOUNTER — Encounter

## 2024-03-29 ENCOUNTER — Emergency Department (HOSPITAL_COMMUNITY)

## 2024-03-29 ENCOUNTER — Emergency Department (HOSPITAL_COMMUNITY)
Admission: EM | Admit: 2024-03-29 | Discharge: 2024-03-29 | Disposition: A | Discharge status: Home / Self Care | Attending: Emergency Medicine | Admitting: Emergency Medicine

## 2024-03-29 ENCOUNTER — Encounter (HOSPITAL_COMMUNITY): Payer: Self-pay

## 2024-03-29 ENCOUNTER — Other Ambulatory Visit: Payer: Self-pay

## 2024-03-29 DIAGNOSIS — S0081XA Abrasion of other part of head, initial encounter: Secondary | ICD-10-CM | POA: Diagnosis not present

## 2024-03-29 DIAGNOSIS — Z7982 Long term (current) use of aspirin: Secondary | ICD-10-CM | POA: Insufficient documentation

## 2024-03-29 DIAGNOSIS — W01198A Fall on same level from slipping, tripping and stumbling with subsequent striking against other object, initial encounter: Secondary | ICD-10-CM | POA: Diagnosis not present

## 2024-03-29 DIAGNOSIS — S022XXA Fracture of nasal bones, initial encounter for closed fracture: Secondary | ICD-10-CM | POA: Insufficient documentation

## 2024-03-29 DIAGNOSIS — S0992XA Unspecified injury of nose, initial encounter: Secondary | ICD-10-CM | POA: Diagnosis present

## 2024-03-29 MED ORDER — CEPHALEXIN 500 MG PO CAPS: 500.0000 mg | ORAL_CAPSULE | Freq: Two times a day (BID) | ORAL | 0 refills | Status: AC

## 2024-03-29 MED ORDER — TETANUS-DIPHTH-ACELL PERTUSSIS 5-2-15.5 LF-MCG/0.5 IM SUSP: 0.5000 mL | Freq: Once | INTRAMUSCULAR | Status: DC

## 2024-03-29 NOTE — ED Notes (Signed)
 [place] on pt nose cleaned

## 2024-03-29 NOTE — ED Provider Notes (Signed)
 [place] [facility] AT [facility] Provider Note   CSN: [id number] Arrival date & time: 03/29/24  1801     Patient presents with: Jenna Hancock   ALAYCIA EARDLEY is a 63 y.o. female.   Patient is a 63 year old female who presents emergency department chief complaint of facial pain following a fall which occurred just prior to arrival.  Patient notes that she tripped over a bucket striking the left side of her face and head on the ground.  She does take aspirin  daily.  She denies any pain to her neck or back.  She does have a small abrasion over her nose.  She is unsure of her last tetanus shot.  She denies any other long bone or joint pain at this time.  She notes that the fall was mechanical in nature with no preceding symptoms.   Fall       Prior to Admission medications   Medication Sig Start Date End Date Taking? Authorizing Provider  cephALEXin  (KEFLEX ) 500 MG capsule Take 1 capsule (500 mg total) by mouth 2 (two) times daily for 7 days. 03/29/24 04/05/24 Yes Deeric Cruise, Lonni D, PA-C  ALPRAZolam  (XANAX ) 0.5 MG tablet Take 1 tablet (0.5 mg total) by mouth 2 (two) times daily as needed for anxiety. 12/25/23   Bevely Doffing, FNP  amLODipine  (NORVASC ) 5 MG tablet Take 1 tablet (5 mg total) by mouth daily. 12/25/23   Bevely Doffing, FNP  aspirin  325 MG tablet Take 325 mg by mouth at bedtime.    [provider]  atorvastatin  (LIPITOR ) 80 MG tablet Take 1 tablet (80 mg total) by mouth daily. 10/16/23   Melvenia Manus BRAVO, MD  azelastine  (ASTELIN ) 0.1 % nasal spray Place 1 spray into both nostrils 2 (two) times daily as needed for rhinitis. Use in each nostril as directed 10/26/23   Jeneal Danita Macintosh, MD  Budeson-Glycopyrrol-Formoterol  (BREZTRI  AEROSPHERE) 160-9-4.8 MCG/ACT AERO Inhale 2 puffs into the lungs in the morning and at bedtime. Patient taking differently: Inhale 2 puffs into the lungs 2 (two) times daily as needed (for shortness of breath). 04/10/23    Parrett, Madelin RAMAN, NP  cefdinir  (OMNICEF ) 300 MG capsule Take 1 capsule (300 mg total) by mouth 2 (two) times daily. 03/04/24   Evonnie Lenis, MD  cetirizine  (ZYRTEC  ALLERGY ) 10 MG tablet Take 1 tablet (10 mg total) by mouth daily. 10/26/23   Jeneal Danita Macintosh, MD  ezetimibe  (ZETIA ) 10 MG tablet Take 10 mg by mouth every evening. 11/24/19   [provider]  FLUoxetine  (PROZAC ) 40 MG capsule TAKE 2 CAPSULES EVERY DAY 03/21/24   Bevely Doffing, FNP  fluticasone  (FLONASE ) 50 MCG/ACT nasal spray USE 2 SPRAYS INTO BOTH NOSTRILS DAILY. 03/21/24   Jeneal Danita Macintosh, MD  folic acid  (FOLVITE ) 800 MCG tablet Take 800 mcg by mouth daily.    [provider]  HYDROcodone -acetaminophen  (NORCO) 10-325 MG tablet Take 1 tablet by mouth in the morning and at bedtime.    [provider]  IBU 800 MG tablet Take 800 mg by mouth daily as needed for mild pain (pain score 1-3) or moderate pain (pain score 4-6). 03/18/23   [provider]  ipratropium-albuterol  (DUONEB) 0.5-2.5 (3) MG/3ML SOLN INHALE THE CONTENTS OF 1 VIAL VIA NEBULIZER FOUR TIMES DAILY 03/07/24   Bevely Doffing, FNP  irbesartan -hydrochlorothiazide  (AVALIDE) 300-12.5 MG tablet Take 1 tablet by mouth daily. 12/25/23   Bevely Doffing, FNP  Multiple Vitamins-Minerals (MULTIVITAMIN WITH MINERALS) tablet Take 1 tablet by mouth daily.  [provider]  nicotine  (NICODERM CQ  - DOSED IN MG/24 HOURS) 21 mg/24hr patch Place 1 patch (21 mg total) onto the skin daily. 03/04/24   Evonnie Lenis, MD  Omega-3 Fatty Acids (FISH OIL) 1000 MG CAPS Take 1 capsule by mouth daily.    [provider]  pantoprazole  (PROTONIX ) 40 MG tablet Take 1 tablet (40 mg total) by mouth daily. 12/25/23   Bevely Doffing, FNP  predniSONE  (DELTASONE ) 10 MG tablet Take 6 tablets (60 mg total) by mouth daily with breakfast. And decrease by one tablet daily 03/04/24   Tat, Lenis, MD  roflumilast  (DALIRESP ) 500 MCG TABS tablet TAKE 1 TABLET  EVERY DAY 11/09/23   Geronimo Amel, MD    Allergies: Other, Ace inhibitors, Bactrim, and Sulfa antibiotics    Review of Systems  HENT:         Facial abrasion  All other systems reviewed and are negative.   Updated Vital Signs BP (!) 153/60   Pulse 78   Temp 99.1 F (37.3 C) (Oral)   Resp 18   Ht 5' 4 (1.626 m)   Wt 88.5 kg   SpO2 97%   BMI 33.47 kg/m   Physical Exam Vitals and nursing note reviewed.  Constitutional:      General: She is not in acute distress.    Appearance: Normal appearance. She is not ill-appearing.  HENT:     Head: Normocephalic and atraumatic.     Nose: Nose normal.     Comments: Tender to palpation of the nasal bridge with mild swelling, abrasion noted over the nasal bridge, no deep lacerations    Mouth/Throat:     Mouth: Mucous membranes are moist.  Eyes:     Extraocular Movements: Extraocular movements intact.     Conjunctiva/sclera: Conjunctivae normal.     Pupils: Pupils are equal, round, and reactive to light.  Cardiovascular:     Rate and Rhythm: Normal rate and regular rhythm.     Pulses: Normal pulses.     Heart sounds: Normal heart sounds. No murmur heard.    No gallop.  Pulmonary:     Effort: Pulmonary effort is normal. No respiratory distress.     Breath sounds: Normal breath sounds. No stridor. No wheezing, rhonchi or rales.  Chest:     Chest wall: No tenderness.  Abdominal:     General: Abdomen is flat. Bowel sounds are normal. There is no distension.     Palpations: Abdomen is soft.     Tenderness: There is no abdominal tenderness. There is no guarding.  Musculoskeletal:        General: No swelling, tenderness, deformity or signs of injury. Normal range of motion.     Cervical back: Normal range of motion and neck supple. No rigidity or tenderness.  Skin:    General: Skin is warm and dry.     Findings: No rash.  Neurological:     General: No focal deficit present.     Mental Status: She is alert and oriented to  person, place, and time. Mental status is at baseline.  Psychiatric:        Mood and Affect: Mood normal.        Behavior: Behavior normal.        Thought Content: Thought content normal.        Judgment: Judgment normal.     (all labs ordered are listed, but only abnormal results are displayed) Labs Reviewed - No data to display  EKG: None  Radiology: CT Maxillofacial Wo Contrast Result Date: 03/29/2024 EXAM: CT OF THE FACE WITHOUT CONTRAST 03/29/2024 07:36:38 PM TECHNIQUE: CT of the face was performed without the administration of intravenous contrast. Multiplanar reformatted images are provided for review. Automated exposure control, iterative reconstruction, and/or weight based adjustment of the mA/kV was utilized to reduce the radiation dose to as low as reasonably achievable. COMPARISON: None available. CLINICAL HISTORY: Facial trauma, blunt. Pt ambulatory to triage, states that she tripped over a bucket and hit her face. Pt denies loc. Pt has lac to nose, bleeding is controled at this time. FINDINGS: FACIAL BONES: Temporomandibular joint degenerative changes. Acute minimally displaced left nasal bone fracture. No mandibular dislocation. No suspicious bone lesion. ORBITS: Globes are intact. No acute traumatic injury. No inflammatory change. SINUSES AND MASTOIDS: Mild maxillary sinus mucosal thickening. SOFT TISSUES: Paranasal soft tissue hematoma and foci of gas. IMPRESSION: 1. Acute minimally displaced left nasal bone fracture. Electronically signed by: Morgane Naveau MD 03/29/2024 07:59 PM EDT RP Workstation: [id number]   CT Head Wo Contrast Result Date: 03/29/2024 EXAM: CT HEAD WITHOUT CONTRAST 03/29/2024 07:36:38 PM TECHNIQUE: CT of the head was performed without the administration of intravenous contrast. Automated exposure control, iterative reconstruction, and/or weight based adjustment of the mA/kV was utilized to reduce the radiation dose to as low as reasonably achievable.  COMPARISON: CT angiography head 04/14/2016. CLINICAL HISTORY: Head trauma, abnormal mental status (Age 41-64y). Pt ambulatory to triage, states that she tripped over a bucket and hit her face. Pt denies loc. Pt has lac to nose, bleeding is controled at this time. FINDINGS: BRAIN AND VENTRICLES: No acute hemorrhage. Patchy and confluent areas of decreased attenuation are noted throughout the deep and periventricular white matter of the cerebral hemispheres bilaterally suggestive of chronic microvascular ischemic changes . No evidence of acute infarct. No hydrocephalus. No extra-axial collection. No mass effect or midline shift. Atherosclerotic calcifications are present within the cavernous internal carotid arteries. ORBITS: No acute abnormality. SINUSES: Mucosal thickening of the left maxillary sinus. SOFT TISSUES AND SKULL: No acute soft tissue abnormality. No skull fracture. IMPRESSION: 1. No acute intracranial abnormality. Electronically signed by: Morgane Naveau MD 03/29/2024 07:58 PM EDT RP Workstation: [id number]     Procedures   Medications Ordered in the ED  Tdap (ADACEL) injection 0.5 mL (0.5 mLs Intramuscular Not Given 03/29/24 1949)                                    Medical Decision Making Patient is doing well at this time and is stable for discharge home.  Discussed with patient that she does have CT scan findings consistent with a left nasal bone fracture.  Will cover with a short course of antibiotics given the abrasion over the nose.  It does not appear to be truly open at this time.  The area does not warrant suturing.  CT scan of the head was unremarked for any signs of acute intracranial hemorrhage.  She had no other long bone or joint tenderness noted on exam.  She was nontender palpation of her cervical, thoracic, lumbar spine.  She had no tenderness over chest wall and abdomen.  Discussed the importance of close follow-up with ENT on an outpatient basis and the need to call to  make an appointment.  Strict return precautions were provided for any new or worsening symptoms.  Patient voiced understanding and had no additional questions.  Amount and/or Complexity  of Data Reviewed Radiology: ordered.  Risk Prescription drug management.        Final diagnoses:  Closed fracture of nasal bone, initial encounter  Abrasion of face, initial encounter    ED Discharge Orders          Ordered    cephALEXin  (KEFLEX ) 500 MG capsule  2 times daily        03/29/24 2017               Daralene Lonni JONETTA DEVONNA 03/29/24 2020    Suzette Pac, MD 04/01/24 1442

## 2024-03-29 NOTE — Discharge Instructions (Signed)
 Please follow-up closely with ENT on outpatient basis.  Please call to make an appointment.  Take all antibiotics as directed.  Return to emergency department immediately for any new or worsening symptoms.

## 2024-03-29 NOTE — ED Triage Notes (Signed)
 Pt ambulatory to triage, states that she tripped over a bucket and hit her face.  Pt denies loc.  Pt has lac to nose, bleeding is controled at this time.

## 2024-04-18 ENCOUNTER — Ambulatory Visit

## 2024-04-25 ENCOUNTER — Other Ambulatory Visit (HOSPITAL_COMMUNITY): Payer: Self-pay | Admitting: Family Medicine

## 2024-04-25 DIAGNOSIS — Z122 Encounter for screening for malignant neoplasm of respiratory organs: Secondary | ICD-10-CM

## 2024-04-25 DIAGNOSIS — F17200 Nicotine dependence, unspecified, uncomplicated: Secondary | ICD-10-CM

## 2024-04-28 ENCOUNTER — Other Ambulatory Visit: Payer: Self-pay | Admitting: Allergy

## 2024-05-02 ENCOUNTER — Ambulatory Visit: Admitting: Internal Medicine

## 2024-05-09 ENCOUNTER — Other Ambulatory Visit: Payer: Self-pay

## 2024-05-13 ENCOUNTER — Ambulatory Visit (HOSPITAL_COMMUNITY)
Admission: RE | Admit: 2024-05-13 | Discharge: 2024-05-13 | Disposition: A | Discharge status: Home / Self Care | Source: Ambulatory Visit | Attending: Family Medicine | Admitting: Family Medicine

## 2024-05-13 DIAGNOSIS — Z122 Encounter for screening for malignant neoplasm of respiratory organs: Secondary | ICD-10-CM

## 2024-05-13 DIAGNOSIS — F17200 Nicotine dependence, unspecified, uncomplicated: Secondary | ICD-10-CM

## 2024-05-16 ENCOUNTER — Other Ambulatory Visit: Payer: Self-pay

## 2024-05-16 ENCOUNTER — Ambulatory Visit

## 2024-05-16 DIAGNOSIS — Z23 Encounter for immunization: Secondary | ICD-10-CM

## 2024-05-16 DIAGNOSIS — F32A Depression, unspecified: Secondary | ICD-10-CM

## 2024-05-16 MED ORDER — FLUOXETINE HCL 40 MG PO CAPS: 80.0000 mg | ORAL_CAPSULE | Freq: Every day | ORAL | 1 refills | Status: DC

## 2024-05-16 NOTE — Progress Notes (Signed)
 Patient is in office today for a nurse visit for Immunization. Patient Injection was given in the  Right deltoid. Patient tolerated injection well.

## 2024-05-19 ENCOUNTER — Encounter (INDEPENDENT_AMBULATORY_CARE_PROVIDER_SITE_OTHER): Payer: Self-pay | Admitting: Gastroenterology

## 2024-05-19 ENCOUNTER — Ambulatory Visit (INDEPENDENT_AMBULATORY_CARE_PROVIDER_SITE_OTHER): Admitting: Gastroenterology

## 2024-05-19 VITALS — BP 110/65 | HR 82 | Temp 98.3°F | Ht 64.0 in | Wt 196.0 lb

## 2024-05-19 DIAGNOSIS — Z23 Encounter for immunization: Secondary | ICD-10-CM

## 2024-05-19 DIAGNOSIS — R109 Unspecified abdominal pain: Secondary | ICD-10-CM

## 2024-05-19 DIAGNOSIS — K219 Gastro-esophageal reflux disease without esophagitis: Secondary | ICD-10-CM | POA: Diagnosis not present

## 2024-05-19 DIAGNOSIS — R142 Eructation: Secondary | ICD-10-CM

## 2024-05-19 DIAGNOSIS — R112 Nausea with vomiting, unspecified: Secondary | ICD-10-CM | POA: Insufficient documentation

## 2024-05-19 DIAGNOSIS — R140 Abdominal distension (gaseous): Secondary | ICD-10-CM

## 2024-05-19 DIAGNOSIS — R12 Heartburn: Secondary | ICD-10-CM

## 2024-05-19 MED ORDER — PROMETHAZINE HCL 12.5 MG PO TABS: 12.5000 mg | ORAL_TABLET | Freq: Three times a day (TID) | ORAL | 1 refills | Status: AC | PRN

## 2024-05-19 NOTE — Patient Instructions (Signed)
 Stop using semaglutide injections Can take Phenergan  as needed to relieve nausea and vomiting Continue with pantoprazole  40 mg twice a day

## 2024-05-19 NOTE — Progress Notes (Signed)
 Toribio Fortune, M.D. Gastroenterology & Hepatology [facility] [address] [place], [place] [address]  Primary Care Physician: Pcp, No No address on file  I will communicate my assessment and recommendations to the referring MD via EMR.  Problems: Foul-smelling burping Abdominal pain and bloating  History of Present Illness: Jenna Hancock is a 63 y.o. female with past medical history of anxiety, depression, hypertension, GERD, hyperlipidemia, OSA, stroke, who presents for eval ration of burping, abdominal pain and bloating.  Patient states that 3 weeks ago she believes had food poisoning - she had severe vomiting and diarrhea. These symptoms lasted a couple of days. However, she reports that she started noticing having frequent burping since then, which had a very foul smell. Also noted having worsening nausea with occasional vomiting.  She has a history of GERD, for which she takes pantoprazole  40 mg qday. Sometimes she takes an extra dose of pantoprazole , especially when eating spicy foods.  Stools are now loose, but not diarrhea. She also has upper abdominal pain along with bloating. The patient denies having any nausea, vomiting, fever, chills, hematochezia, melena, hematemesis, jaundice, pruritus. Has lost 7 lb since starting semaglutide.  States that 5 weeks ago she started using semaglutide weekly, which she was getting from her daughter, who had some prescribed.  Last EGD: Last Colonoscopy: 12/10/21 - Seven 3 to 8 mm polyps in the transverse colon and in the ascending colon, removed with a cold snare. Resected and retrieved. - One 2 mm polyp in the transverse colon, removed with a cold biopsy forceps. Resected and retrieved. - One 5 mm polyp in the descending colon, removed with a cold snare. Resected and retrieved. - Diverticulosis in the descending colon. - Non- bleeding internal hemorrhoids.  Pathology showed 9 colonic tubular  adenoma, repeat colonoscopy in 3 years   Past Medical History: Past Medical History:  Diagnosis Date   Anxiety    Arthritis    Depression    Essential hypertension, benign 12/08/2016   GERD (gastroesophageal reflux disease)    Heart murmur    AS A CHILD    High cholesterol 12/08/2016   Hyperlipidemia    Hypertension    PONV (postoperative nausea and vomiting)    Recurrent upper respiratory infection (URI)    Sleep apnea    DOES NOT USE CPAP    Stroke (HCC)    HX OF MINI STROKE 2016    TIA (transient ischemic attack) 2017    Past Surgical History: Past Surgical History:  Procedure Laterality Date   APPENDECTOMY     BIOPSY  12/10/2021   Procedure: BIOPSY;  Surgeon: Fortune Angelia Toribio, MD;  Location: [facility];  Service: Gastroenterology;;   COLONOSCOPY WITH PROPOFOL  N/A 12/10/2021   Procedure: COLONOSCOPY WITH PROPOFOL ;  Surgeon: Fortune Angelia Toribio, MD;  Location: [facility];  Service: Gastroenterology;  Laterality: N/A;  815   JOINT REPLACEMENT     rt knee 11 yrs ago   KNEE ARTHROSCOPY Left    NASAL SINUS SURGERY     POLYPECTOMY  12/10/2021   Procedure: POLYPECTOMY;  Surgeon: Fortune Angelia Toribio, MD;  Location: [facility];  Service: Gastroenterology;;   TOTAL KNEE ARTHROPLASTY Left 02/01/2020   Procedure: TOTAL KNEE ARTHROPLASTY;  Surgeon: Duwayne Purchase, MD;  Location: [facility];  Service: Orthopedics;  Laterality: Left;  general or spinal    Family History: Family History  Problem Relation Age of Onset   Lung cancer Mother    Diabetes  Father    Stroke Father    Heart disease Father    Asthma Son     Social History: Social History   Tobacco Use  Smoking Status Former   Current packs/day: 0.00   Average packs/day: 1 pack/day for 40.3 years (40.3 ttl pk-yrs)   Types: Cigarettes   Start date: 40   Quit date: 10/04/2021   Years since quitting: 2.6  Smokeless Tobacco Never  Tobacco Comments   Former smoker    Social History    Substance and Sexual Activity  Alcohol Use No   Social History   Substance and Sexual Activity  Drug Use No    Allergies: Allergies  Allergen Reactions   Other Other (See Comments)    Product containing angiotensin-converting enzyme inhibitor (product)   Ace Inhibitors Nausea Only   Bactrim Rash   Sulfa Antibiotics Rash    Medications: Current Outpatient Medications  Medication Sig Dispense Refill   ALPRAZolam  (XANAX ) 0.5 MG tablet Take 1 tablet (0.5 mg total) by mouth 2 (two) times daily as needed for anxiety. 90 tablet 0   amLODipine  (NORVASC ) 5 MG tablet Take 1 tablet (5 mg total) by mouth daily. 90 tablet 2   aspirin  325 MG tablet Take 325 mg by mouth at bedtime.     atorvastatin  (LIPITOR ) 80 MG tablet Take 1 tablet (80 mg total) by mouth daily. 90 tablet 3   azelastine  (ASTELIN ) 0.1 % nasal spray Place 1 spray into both nostrils 2 (two) times daily as needed for rhinitis. Use in each nostril as directed 30 mL 5   Budeson-Glycopyrrol-Formoterol  (BREZTRI  AEROSPHERE) 160-9-4.8 MCG/ACT AERO Inhale 2 puffs into the lungs in the morning and at bedtime. (Patient taking differently: Inhale 2 puffs into the lungs as needed.) 1 each 5   cetirizine  (ZYRTEC ) 10 MG tablet TAKE 1 TABLET EVERY DAY 90 tablet 3   ezetimibe  (ZETIA ) 10 MG tablet Take 10 mg by mouth every evening.     FLUoxetine  (PROZAC ) 40 MG capsule Take 2 capsules (80 mg total) by mouth daily. 180 capsule 1   fluticasone  (FLONASE ) 50 MCG/ACT nasal spray USE 2 SPRAYS INTO BOTH NOSTRILS DAILY. 48 g 3   folic acid  (FOLVITE ) 800 MCG tablet Take 800 mcg by mouth daily.     HYDROcodone -acetaminophen  (NORCO) 10-325 MG tablet Take 1 tablet by mouth in the morning and at bedtime.     IBU 800 MG tablet Take 800 mg by mouth daily as needed for mild pain (pain score 1-3) or moderate pain (pain score 4-6).     ipratropium-albuterol  (DUONEB) 0.5-2.5 (3) MG/3ML SOLN INHALE THE CONTENTS OF 1 VIAL VIA NEBULIZER FOUR TIMES DAILY 1080 mL 3    irbesartan -hydrochlorothiazide  (AVALIDE) 300-12.5 MG tablet Take 1 tablet by mouth daily. 90 tablet 2   Multiple Vitamins-Minerals (MULTIVITAMIN WITH MINERALS) tablet Take 1 tablet by mouth daily.     Omega-3 Fatty Acids (FISH OIL) 1000 MG CAPS Take 1 capsule by mouth daily.     pantoprazole  (PROTONIX ) 40 MG tablet Take 1 tablet (40 mg total) by mouth daily. 90 tablet 2   roflumilast  (DALIRESP ) 500 MCG TABS tablet TAKE 1 TABLET EVERY DAY 90 tablet 3   SEMAGLUTIDE PO Take by mouth.     No current facility-administered medications for this visit.    Review of Systems: GENERAL: negative for malaise, night sweats HEENT: No changes in hearing or vision, no nose bleeds or other nasal problems. NECK: Negative for lumps, goiter, pain and significant neck swelling RESPIRATORY:  Negative for cough, wheezing CARDIOVASCULAR: Negative for chest pain, leg swelling, palpitations, orthopnea GI: SEE HPI MUSCULOSKELETAL: Negative for joint pain or swelling, back pain, and muscle pain. SKIN: Negative for lesions, rash PSYCH: Negative for sleep disturbance, mood disorder and recent psychosocial stressors. HEMATOLOGY Negative for prolonged bleeding, bruising easily, and swollen nodes. ENDOCRINE: Negative for cold or heat intolerance, polyuria, polydipsia and goiter. NEURO: negative for tremor, gait imbalance, syncope and seizures. The remainder of the review of systems is noncontributory.   Physical Exam: BP 110/65   Pulse 82   Temp 98.3 F (36.8 C)   Ht 5' 4 (1.626 m)   Wt 196 lb (88.9 kg)   BMI 33.64 kg/m  GENERAL: The patient is AO x3, in no acute distress. HEENT: Head is normocephalic and atraumatic. EOMI are intact. Mouth is well hydrated and without lesions. NECK: Supple. No masses LUNGS: Clear to auscultation. No presence of rhonchi/wheezing/rales. Adequate chest expansion HEART: RRR, normal s1 and s2. ABDOMEN: Soft, nontender, no guarding, no peritoneal signs, and nondistended. BS +. No  masses. EXTREMITIES: Without any cyanosis, clubbing, rash, lesions or edema. NEUROLOGIC: AOx3, no focal motor deficit. SKIN: no jaundice, no rashes  Imaging/Labs: as above  I personally reviewed and interpreted the available labs, imaging and endoscopic files.  Impression and Plan: Jenna Hancock is a 62 y.o. female with past medical history of anxiety, depression, hypertension, GERD, hyperlipidemia, OSA, stroke, who presents for eval ration of burping, abdominal pain and bloating.  Patient has presented new onset of recent gastrointestinal symptoms, which were preceded by recent start and uptitration of semaglutide.  Patient did not have any of these complaints prior to this.  I suspect that her symptoms are related to motility effects of the medication in her gastrointestinal system, for which I advised the patient to stop using semaglutide.  I also emphasized that if she were to restart this medication, this should be prescribed and monitored by a physician, due to the risks of sarcopenia, pancreatitis and gastrointestinal side effects.  Patient understood.  Will provide symptom relief for now with Phenergan .  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  For now she can continue taking pantoprazole  twice a day.  -Stop using semaglutide injections -Can take Phenergan  as needed to relieve nausea and vomiting -Continue with pantoprazole  40 mg twice a day  All questions were answered.      Toribio Fortune, MD Gastroenterology and Hepatology [facility]

## 2024-05-23 ENCOUNTER — Ambulatory Visit: Admitting: Internal Medicine

## 2024-05-23 NOTE — Progress Notes (Signed)
 Patient is in office today for a nurse visit for Immunization. Patient Injection was given in the  Right deltoid. Patient tolerated injection well.

## 2024-06-13 NOTE — Addendum Note (Signed)
 Addended by: LORELI MIRIAM SAUNDERS on: 06/13/2024 08:50 AM   Modules accepted: Orders

## 2024-06-18 ENCOUNTER — Other Ambulatory Visit: Payer: Self-pay

## 2024-06-18 DIAGNOSIS — F32A Depression, unspecified: Secondary | ICD-10-CM

## 2024-06-29 ENCOUNTER — Ambulatory Visit (INDEPENDENT_AMBULATORY_CARE_PROVIDER_SITE_OTHER): Payer: Self-pay

## 2024-06-29 ENCOUNTER — Other Ambulatory Visit: Payer: Self-pay

## 2024-06-29 VITALS — BP 128/76 | HR 73 | Resp 16 | Ht 64.0 in | Wt 200.1 lb

## 2024-06-29 DIAGNOSIS — G894 Chronic pain syndrome: Secondary | ICD-10-CM

## 2024-06-29 DIAGNOSIS — K219 Gastro-esophageal reflux disease without esophagitis: Secondary | ICD-10-CM | POA: Diagnosis not present

## 2024-06-29 DIAGNOSIS — E66811 Obesity, class 1: Secondary | ICD-10-CM | POA: Diagnosis not present

## 2024-06-29 DIAGNOSIS — L700 Acne vulgaris: Secondary | ICD-10-CM

## 2024-06-29 DIAGNOSIS — E782 Mixed hyperlipidemia: Secondary | ICD-10-CM | POA: Diagnosis not present

## 2024-06-29 DIAGNOSIS — G4733 Obstructive sleep apnea (adult) (pediatric): Secondary | ICD-10-CM | POA: Diagnosis not present

## 2024-06-29 DIAGNOSIS — R7303 Prediabetes: Secondary | ICD-10-CM

## 2024-06-29 DIAGNOSIS — I10 Essential (primary) hypertension: Secondary | ICD-10-CM

## 2024-06-29 MED ORDER — DOXYCYCLINE HYCLATE 100 MG PO TABS: 100.0000 mg | ORAL_TABLET | Freq: Every day | ORAL | 11 refills | Status: AC

## 2024-06-29 MED ORDER — ZEPBOUND 2.5 MG/0.5ML ~~LOC~~ SOAJ: 2.5000 mg | SUBCUTANEOUS | 2 refills | Status: AC

## 2024-06-29 MED ORDER — IBU 800 MG PO TABS: 800.0000 mg | ORAL_TABLET | Freq: Every day | ORAL | 3 refills | Status: AC | PRN

## 2024-06-29 MED ORDER — MINOCYCLINE HCL 50 MG PO TABS: 50.0000 mg | ORAL_TABLET | Freq: Every day | ORAL | 11 refills | Status: DC

## 2024-06-29 NOTE — Progress Notes (Signed)
 "  Established Patient Office Visit  Subjective   Patient ID: Jenna Hancock, female    DOB: [birth year]  Age: 64 y.o. MRN: [id number]  Chief Complaint  Patient presents with   Obesity    Wants to discuss trying the weight loss injections     HPI Discussed the use of AI scribe software for clinical note transcription with the patient, who gave verbal consent to proceed.  History of Present Illness    Jenna Hancock is a 64 year old female with hypertension, hyperlipidemia, and sleep apnea who presents for a follow-up visit and medication refill.  Hypertension and hyperlipidemia - Hypertension and hyperlipidemia are ongoing conditions. - Considering weight loss medication options to potentially improve blood pressure and lipid profile. - Sister discontinued antihypertensive medication after weight loss, increasing patient's interest in weight management. - History of mini-stroke, unsure of exact timing.  Sleep apnea - Mild sleep apnea diagnosed by sleep study over two years ago. - Aware that weight loss could potentially improve sleep apnea symptoms.  Facial boils - Occasional facial boils, more frequent in summertime. - Currently taking minocycline  as needed for management.     Patient Active Problem List   Diagnosis Date Noted   Obesity (BMI 30.0-34.9) 07/04/2024   Prediabetes 07/04/2024   Cystic acne 07/04/2024   Nausea with vomiting 05/19/2024   Chronic pain syndrome 07/17/2023   Anxiety and depression 07/17/2023   GERD (gastroesophageal reflux disease) 07/17/2023   COPD (chronic obstructive pulmonary disease) (HCC) 04/13/2023   Sinusitis 09/09/2022   ERRONEOUS ENCOUNTER--DISREGARD 07/23/2022   CAP (community acquired pneumonia) 07/17/2022   Chronic rhinitis 07/01/2022   DOE (dyspnea on exertion) 10/18/2021   ILD (interstitial lung disease) (HCC) 10/10/2021   Obesity, Class II, BMI 35-39.9 10/10/2021   Chronic respiratory failure (HCC) 10/10/2021   Multifocal  pneumonia 10/09/2021   Acute respiratory failure with hypoxia (HCC) 10/09/2021   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  Constitutional:      Appearance: Normal appearance.  HENT:     Head: Normocephalic.  Eyes:     Extraocular Movements: Extraocular movements intact.     Pupils: Pupils are equal, round, and reactive to light.  Cardiovascular:     Rate and Rhythm: Normal rate and regular rhythm.  Pulmonary:     Effort: Pulmonary effort is normal.     Breath sounds: Normal breath sounds.  Musculoskeletal:     Cervical back: Normal range of motion and neck supple.  Neurological:     Mental Status: She is alert and oriented to person, place, and time.  Psychiatric:        Mood and Affect: Mood normal.        Thought Content: Thought content normal.  No results found for any visits on 06/29/24.    The ASCVD Risk score (Arnett DK, et al., 2019) failed to calculate for the following reasons:   Risk score cannot be calculated because patient has a medical history suggesting prior/existing ASCVD   * - Cholesterol units were assumed    Assessment & Plan:   Problem List Items Addressed This Visit       Cardiovascular and Mediastinum   Essential hypertension, benign (Chronic)   Hypertension management discussed in the context of potential weight loss benefits. - Submitted  request for weight loss medication coverage.      Relevant Orders   CMP14+EGFR     Respiratory   Obstructive sleep apnea   Mild obstructive sleep apnea. Weight loss may improve symptoms. - Submitted request for weight loss medication coverage.      Relevant Medications   tirzepatide  (ZEPBOUND ) 2.5 MG/0.5ML Pen     Digestive   GERD (gastroesophageal reflux disease)   Stable with pantoprazole  40 mg.  No changes made today.        Relevant Orders   CMP14+EGFR   CBC with Differential/Platelet     Musculoskeletal and Integument   Cystic acne   Intermittent cystic acne on the face, managed with minocycline  as needed. - Continue minocycline  as needed for cystic acne.           Other   Hyperlipidemia   Management discussed in the context of potential weight loss benefits. - Submitted request for weight loss medication coverage.      Relevant Orders   Lipid panel   CMP14+EGFR   Chronic pain syndrome - Primary   Relevant Medications   IBU 800 MG tablet   Obesity (BMI 30.0-34.9)   Management discussed with interest in weight loss medications. Tirzepatide  (Zepbound ) indicated for sleep apnea, which may improve with weight loss. Alternative options include online sources for GLP-1 medications, though cost remains a consideration. - Submitted request for weight loss medication coverage. - Discussed alternative options for obtaining GLP-1 medications online.      Relevant Orders   Hemoglobin A1c   Lipid panel   CMP14+EGFR   CBC with Differential/Platelet   Prediabetes   Previous A1c in prediabetic range. Weight loss medications may be considered if diabetes is confirmed. - Ordered fasting labs to recheck A1c.      Relevant Orders   Hemoglobin A1c   CMP14+EGFR    Return in about 6 months (around 12/27/2024) for chronic follow-up with PCP.    Leita Longs, FNP  "

## 2024-07-04 DIAGNOSIS — L700 Acne vulgaris: Secondary | ICD-10-CM | POA: Insufficient documentation

## 2024-07-04 DIAGNOSIS — E66811 Obesity, class 1: Secondary | ICD-10-CM | POA: Insufficient documentation

## 2024-07-04 DIAGNOSIS — R7303 Prediabetes: Secondary | ICD-10-CM | POA: Insufficient documentation

## 2024-07-04 NOTE — Assessment & Plan Note (Signed)
 Management discussed with interest in weight loss medications. Tirzepatide  (Zepbound ) indicated for sleep apnea, which may improve with weight loss. Alternative options include online sources for GLP-1 medications, though cost remains a consideration. - Submitted request for weight loss medication coverage. - Discussed alternative options for obtaining GLP-1 medications online.

## 2024-07-04 NOTE — Assessment & Plan Note (Signed)
 Previous A1c in prediabetic range. Weight loss medications may be considered if diabetes is confirmed. - Ordered fasting labs to recheck A1c.

## 2024-07-04 NOTE — Assessment & Plan Note (Signed)
 Intermittent cystic acne on the face, managed with minocycline  as needed. - Continue minocycline  as needed for cystic acne.

## 2024-07-04 NOTE — Assessment & Plan Note (Signed)
 Management discussed in the context of potential weight loss benefits. - Submitted request for weight loss medication coverage.

## 2024-07-04 NOTE — Assessment & Plan Note (Signed)
 Mild obstructive sleep apnea. Weight loss may improve symptoms. - Submitted request for weight loss medication coverage.

## 2024-07-04 NOTE — Assessment & Plan Note (Signed)
 Stable with pantoprazole  40 mg.  No changes made today.

## 2024-07-04 NOTE — Assessment & Plan Note (Signed)
 Hypertension management discussed in the context of potential weight loss benefits. - Submitted request for weight loss medication coverage.

## 2024-07-06 LAB — LIPID PANEL
Chol/HDL Ratio: 2.2 ratio (ref 0.0–4.4)
Cholesterol, Total: 113 mg/dL (ref 100–199)
HDL: 52 mg/dL
LDL Chol Calc (NIH): 45 mg/dL (ref 0–99)
Triglycerides: 77 mg/dL (ref 0–149)
VLDL Cholesterol Cal: 16 mg/dL (ref 5–40)

## 2024-07-06 LAB — CMP14+EGFR
ALT: 17 IU/L (ref 0–32)
AST: 19 IU/L (ref 0–40)
Albumin: 4.3 g/dL (ref 3.9–4.9)
Alkaline Phosphatase: 125 IU/L (ref 49–135)
BUN/Creatinine Ratio: 20 (ref 12–28)
BUN: 13 mg/dL (ref 8–27)
Bilirubin Total: 0.4 mg/dL (ref 0.0–1.2)
CO2: 23 mmol/L (ref 20–29)
Calcium: 9.5 mg/dL (ref 8.7–10.3)
Chloride: 103 mmol/L (ref 96–106)
Creatinine, Ser: 0.65 mg/dL (ref 0.57–1.00)
Globulin, Total: 2.6 g/dL (ref 1.5–4.5)
Glucose: 108 mg/dL — ABNORMAL HIGH (ref 70–99)
Potassium: 4.1 mmol/L (ref 3.5–5.2)
Sodium: 140 mmol/L (ref 134–144)
Total Protein: 6.9 g/dL (ref 6.0–8.5)
eGFR: 98 mL/min/1.73

## 2024-07-06 LAB — CBC WITH DIFFERENTIAL/PLATELET
Basophils Absolute: 0 x10E3/uL (ref 0.0–0.2)
Basos: 0 %
EOS (ABSOLUTE): 0.2 x10E3/uL (ref 0.0–0.4)
Eos: 2 %
Hematocrit: 38.1 % (ref 34.0–46.6)
Hemoglobin: 12.5 g/dL (ref 11.1–15.9)
Immature Grans (Abs): 0.1 x10E3/uL (ref 0.0–0.1)
Immature Granulocytes: 1 %
Lymphocytes Absolute: 2.2 x10E3/uL (ref 0.7–3.1)
Lymphs: 22 %
MCH: 28.7 pg (ref 26.6–33.0)
MCHC: 32.8 g/dL (ref 31.5–35.7)
MCV: 87 fL (ref 79–97)
Monocytes Absolute: 0.7 x10E3/uL (ref 0.1–0.9)
Monocytes: 7 %
Neutrophils Absolute: 6.9 x10E3/uL (ref 1.4–7.0)
Neutrophils: 68 %
Platelets: 243 x10E3/uL (ref 150–450)
RBC: 4.36 x10E6/uL (ref 3.77–5.28)
RDW: 13.1 % (ref 11.7–15.4)
WBC: 10.1 x10E3/uL (ref 3.4–10.8)

## 2024-07-06 LAB — HEMOGLOBIN A1C
Est. average glucose Bld gHb Est-mCnc: 123 mg/dL
Hgb A1c MFr Bld: 5.9 % — ABNORMAL HIGH (ref 4.8–5.6)

## 2024-07-11 ENCOUNTER — Other Ambulatory Visit (HOSPITAL_COMMUNITY): Payer: Self-pay

## 2024-07-12 ENCOUNTER — Other Ambulatory Visit (HOSPITAL_COMMUNITY): Payer: Self-pay

## 2024-07-12 ENCOUNTER — Telehealth: Payer: Self-pay | Admitting: Pharmacist

## 2024-07-12 NOTE — Telephone Encounter (Signed)
 Pharmacy Patient Advocate Encounter  Received notification from [employer] that Prior Authorization for tirzepatide  (ZEPBOUND ) 2.5 MG/0.5ML Penhas been DENIED.  Full denial letter will be uploaded to the media tab. See denial reason below.   PA #/Case ID/Reference #: [id number]

## 2024-07-12 NOTE — Telephone Encounter (Signed)
 Pharmacy Patient Advocate Encounter   Received notification from [facility] that prior authorization for tirzepatide  (ZEPBOUND ) 2.5 MG/0.5ML Pen is required/requested.   Insurance verification completed.   The patient is insured through [employer].   Per test claim: PA required; PA submitted to above mentioned insurance via Latent Key/confirmation #/[facility] Status is pending    **based on sleep study, AHI 8, mild sleep apnea

## 2024-12-27 ENCOUNTER — Ambulatory Visit: Payer: Self-pay
